# Patient Record
Sex: Male | Born: 1960 | Race: White | Hispanic: No | Marital: Married | Smoking: Former smoker
Health system: Southern US, Community
[De-identification: ages and names within clinical notes are randomized; demographics above are authoritative.]

## PROBLEM LIST (undated history)

## (undated) DIAGNOSIS — S86009A Unspecified injury of unspecified Achilles tendon, initial encounter: Secondary | ICD-10-CM

## (undated) DIAGNOSIS — D751 Secondary polycythemia: Secondary | ICD-10-CM

## (undated) DIAGNOSIS — G5760 Lesion of plantar nerve, unspecified lower limb: Secondary | ICD-10-CM

## (undated) DIAGNOSIS — K219 Gastro-esophageal reflux disease without esophagitis: Secondary | ICD-10-CM

## (undated) DIAGNOSIS — Z720 Tobacco use: Secondary | ICD-10-CM

## (undated) DIAGNOSIS — J302 Other seasonal allergic rhinitis: Secondary | ICD-10-CM

## (undated) DIAGNOSIS — Z9861 Coronary angioplasty status: Secondary | ICD-10-CM

## (undated) DIAGNOSIS — R7309 Other abnormal glucose: Secondary | ICD-10-CM

## (undated) DIAGNOSIS — N529 Male erectile dysfunction, unspecified: Secondary | ICD-10-CM

## (undated) DIAGNOSIS — I2510 Atherosclerotic heart disease of native coronary artery without angina pectoris: Secondary | ICD-10-CM

## (undated) DIAGNOSIS — T7840XA Allergy, unspecified, initial encounter: Secondary | ICD-10-CM

## (undated) DIAGNOSIS — E785 Hyperlipidemia, unspecified: Secondary | ICD-10-CM

## (undated) DIAGNOSIS — G629 Polyneuropathy, unspecified: Secondary | ICD-10-CM

## (undated) DIAGNOSIS — I10 Essential (primary) hypertension: Secondary | ICD-10-CM

## (undated) DIAGNOSIS — M1990 Unspecified osteoarthritis, unspecified site: Secondary | ICD-10-CM

## (undated) DIAGNOSIS — J45909 Unspecified asthma, uncomplicated: Secondary | ICD-10-CM

## (undated) DIAGNOSIS — I2109 ST elevation (STEMI) myocardial infarction involving other coronary artery of anterior wall: Secondary | ICD-10-CM

## (undated) HISTORY — DX: Male erectile dysfunction, unspecified: N52.9

## (undated) HISTORY — DX: Allergy, unspecified, initial encounter: T78.40XA

## (undated) HISTORY — DX: Unspecified injury of unspecified achilles tendon, initial encounter: S86.009A

## (undated) HISTORY — DX: Essential (primary) hypertension: I10

## (undated) HISTORY — PX: OTHER SURGICAL HISTORY: SHX169

## (undated) HISTORY — DX: Gastro-esophageal reflux disease without esophagitis: K21.9

## (undated) HISTORY — DX: Other abnormal glucose: R73.09

## (undated) HISTORY — DX: Hyperlipidemia, unspecified: E78.5

## (undated) HISTORY — DX: Lesion of plantar nerve, unspecified lower limb: G57.60

## (undated) HISTORY — DX: Other seasonal allergic rhinitis: J30.2

---

## 2004-07-29 ENCOUNTER — Ambulatory Visit: Payer: Self-pay | Admitting: Family Medicine

## 2004-12-22 ENCOUNTER — Ambulatory Visit: Payer: Self-pay | Admitting: Family Medicine

## 2005-01-31 ENCOUNTER — Ambulatory Visit: Payer: Self-pay | Admitting: Family Medicine

## 2005-03-10 ENCOUNTER — Ambulatory Visit: Payer: Self-pay | Admitting: Family Medicine

## 2005-03-17 ENCOUNTER — Ambulatory Visit: Payer: Self-pay | Admitting: Family Medicine

## 2005-03-18 ENCOUNTER — Ambulatory Visit: Payer: Self-pay | Admitting: Family Medicine

## 2005-03-24 ENCOUNTER — Ambulatory Visit: Payer: Self-pay | Admitting: Family Medicine

## 2005-06-01 ENCOUNTER — Ambulatory Visit: Payer: Self-pay | Admitting: Family Medicine

## 2005-07-22 ENCOUNTER — Ambulatory Visit: Payer: Self-pay | Admitting: Family Medicine

## 2005-07-26 ENCOUNTER — Ambulatory Visit: Payer: Self-pay | Admitting: Family Medicine

## 2005-08-22 ENCOUNTER — Ambulatory Visit: Payer: Self-pay | Admitting: Family Medicine

## 2006-03-24 ENCOUNTER — Ambulatory Visit: Payer: Self-pay | Admitting: Family Medicine

## 2006-03-24 LAB — CONVERTED CEMR LAB: Blood Glucose, Fasting: 93 mg/dL

## 2006-03-28 ENCOUNTER — Ambulatory Visit: Payer: Self-pay | Admitting: Family Medicine

## 2006-11-09 ENCOUNTER — Ambulatory Visit: Payer: Self-pay | Admitting: Family Medicine

## 2007-03-27 ENCOUNTER — Encounter: Payer: Self-pay | Admitting: Family Medicine

## 2007-03-28 DIAGNOSIS — R739 Hyperglycemia, unspecified: Secondary | ICD-10-CM | POA: Insufficient documentation

## 2007-03-28 DIAGNOSIS — E785 Hyperlipidemia, unspecified: Secondary | ICD-10-CM | POA: Insufficient documentation

## 2007-03-28 DIAGNOSIS — I10 Essential (primary) hypertension: Secondary | ICD-10-CM | POA: Insufficient documentation

## 2007-03-28 DIAGNOSIS — M109 Gout, unspecified: Secondary | ICD-10-CM | POA: Insufficient documentation

## 2007-03-28 DIAGNOSIS — K219 Gastro-esophageal reflux disease without esophagitis: Secondary | ICD-10-CM | POA: Insufficient documentation

## 2007-04-17 ENCOUNTER — Ambulatory Visit: Payer: Self-pay | Admitting: Family Medicine

## 2007-04-23 ENCOUNTER — Ambulatory Visit: Payer: Self-pay | Admitting: Family Medicine

## 2007-06-04 ENCOUNTER — Telehealth (INDEPENDENT_AMBULATORY_CARE_PROVIDER_SITE_OTHER): Payer: Self-pay | Admitting: *Deleted

## 2007-06-25 ENCOUNTER — Ambulatory Visit: Payer: Self-pay | Admitting: Family Medicine

## 2007-11-14 ENCOUNTER — Ambulatory Visit: Payer: Self-pay | Admitting: Family Medicine

## 2007-11-14 DIAGNOSIS — J309 Allergic rhinitis, unspecified: Secondary | ICD-10-CM | POA: Insufficient documentation

## 2007-11-21 ENCOUNTER — Ambulatory Visit: Payer: Self-pay | Admitting: Family Medicine

## 2007-11-28 ENCOUNTER — Ambulatory Visit: Payer: Self-pay | Admitting: Internal Medicine

## 2008-04-28 ENCOUNTER — Ambulatory Visit: Payer: Self-pay | Admitting: Family Medicine

## 2008-04-29 ENCOUNTER — Encounter: Payer: Self-pay | Admitting: Family Medicine

## 2008-05-05 ENCOUNTER — Ambulatory Visit: Payer: Self-pay | Admitting: Family Medicine

## 2008-08-29 ENCOUNTER — Telehealth: Payer: Self-pay | Admitting: Family Medicine

## 2008-10-27 ENCOUNTER — Ambulatory Visit: Payer: Self-pay | Admitting: Family Medicine

## 2008-11-05 ENCOUNTER — Telehealth: Payer: Self-pay | Admitting: Family Medicine

## 2008-12-08 ENCOUNTER — Telehealth: Payer: Self-pay | Admitting: Family Medicine

## 2009-04-20 ENCOUNTER — Telehealth: Payer: Self-pay | Admitting: Family Medicine

## 2009-04-21 ENCOUNTER — Telehealth: Payer: Self-pay | Admitting: Family Medicine

## 2009-04-21 DIAGNOSIS — N529 Male erectile dysfunction, unspecified: Secondary | ICD-10-CM | POA: Insufficient documentation

## 2009-04-23 ENCOUNTER — Telehealth: Payer: Self-pay | Admitting: Family Medicine

## 2009-08-10 ENCOUNTER — Ambulatory Visit: Payer: Self-pay | Admitting: Family Medicine

## 2009-08-13 ENCOUNTER — Ambulatory Visit: Payer: Self-pay | Admitting: Family Medicine

## 2009-08-13 DIAGNOSIS — E875 Hyperkalemia: Secondary | ICD-10-CM | POA: Insufficient documentation

## 2009-08-28 ENCOUNTER — Ambulatory Visit: Payer: Self-pay | Admitting: Family Medicine

## 2009-09-21 ENCOUNTER — Telehealth: Payer: Self-pay | Admitting: Family Medicine

## 2009-09-21 ENCOUNTER — Ambulatory Visit: Payer: Self-pay | Admitting: Family Medicine

## 2010-02-22 ENCOUNTER — Encounter (INDEPENDENT_AMBULATORY_CARE_PROVIDER_SITE_OTHER): Payer: Self-pay | Admitting: *Deleted

## 2010-06-15 ENCOUNTER — Telehealth: Payer: Self-pay | Admitting: Family Medicine

## 2010-08-17 NOTE — Progress Notes (Signed)
Summary: 2nd prior auth form for viagra  Phone Note From Pharmacy   Caller: [facility] Fax) Reason for Call: Patient requests substitution Summary of Call: [facility] has sent a second prior auth form for viagra, form is on your shelf. Initial call taken by: Lowella Petties CMA,  April 23, 2009 3:41 PM  Follow-up for Phone Call        Done. Follow-up by: Shaune Leeks MD,  April 23, 2009 3:55 PM  Additional Follow-up for Phone Call Additional follow up Details #1::        Form faxed Additional Follow-up by: Lowella Petties CMA,  April 23, 2009 4:39 PM

## 2010-08-17 NOTE — Progress Notes (Signed)
Summary: prior auth needed for viagra  Phone Note From Pharmacy   Caller: [facility] Fax) Summary of Call: Prior auth needed for viagra, form is on your shelf. Initial call taken by: Lowella Petties CMA,  April 21, 2009 4:44 PM  Follow-up for Phone Call        Completed. Follow-up by: Shaune Leeks MD,  April 21, 2009 6:50 PM  Additional Follow-up for Phone Call Additional follow up Details #1::        Form faxed. 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

## 2010-08-17 NOTE — Progress Notes (Signed)
Summary: needs written script for atenolol  Phone Note Refill Request Call back at Home Phone [phone] Message from:  Patient  Refills Requested: Medication #1:  ATENOLOL 25 MG  TABS one by mouth bid Pt needs 90 day written script for mail order, please call when ready.  1 month supply sent to [facility] while he waits for mail order.  Initial call taken by: Lowella Petties,  August 29, 2008 3:37 PM  Follow-up for Phone Call        Advised pt script is ready for pick up Follow-up by: Lowella Petties,  September 01, 2008 1:25 PM      Prescriptions: ATENOLOL 25 MG  TABS (ATENOLOL) one by mouth bid  #180 x 4   Entered and Authorized by:   Shaune Leeks MD   Signed by:   Shaune Leeks MD on 09/01/2008   Method used:   Print then Give to Patient   RxID:   [id number] ATENOLOL 25 MG  TABS (ATENOLOL) one by mouth bid  #60 x 0   Entered by:   Lowella Petties   Authorized by:   Shaune Leeks MD   Signed by:   Lowella Petties on 08/29/2008   Method used:   Electronically to        [facility]. [address] [phone]* (retail)       [address] [place], [place]  [id number]       Ph: [phone]       Fax: [phone]   RxID:   [id number]

## 2010-08-17 NOTE — Assessment & Plan Note (Signed)
Summary: 2 MONTH FOLLOW UP BP/RBH   Vital Signs:  Patient Profile:   50 Years Old Male Height:     67 inches Weight:      206 pounds Temp:     98.5 degrees F oral Pulse rate:   56 / minute Pulse rhythm:   regular BP sitting:   124 / 80  (left arm) Cuff size:   regular  Vitals Entered By: Providence Crosby (June 25, 2007 10:17 AM)                 Chief Complaint:  2 MONTH F/U.  History of Present Illness: Doing ok, no complaints...had some hemm issues a few weeks ago that prescription supps resolved. Is here for bp CHECK AND IS DOING FINE, NOS AT HOME ARE GOOD. Shoulder is doing ok, takes Naprosyn for toe pain more than anything else.  Current Allergies (reviewed today): PENICILLIN      Physical Exam  General:     Well-developed,well-nourished,in no acute distress; alert,appropriate and cooperative throughout examination Head:     Normocephalic and atraumatic without obvious abnormalities. No apparent alopecia or balding. Eyes:     Conjunctiva clear bilaterally.  Ears:     External ear exam shows no significant lesions or deformities.  Otoscopic examination reveals clear canals, tympanic membranes are intact bilaterally without bulging, retraction, inflammation or discharge. Hearing is grossly normal bilaterally. Nose:     External nasal examination shows no deformity or inflammation. Nasal mucosa are pink and moist without lesions or exudates. Mouth:     Oral mucosa and oropharynx without lesions or exudates.  Teeth in good repair. Neck:     No deformities, masses, or tenderness noted. Chest Wall:     No deformities, masses, tenderness or gynecomastia noted. Lungs:     Normal respiratory effort, chest expands symmetrically. Lungs are clear to auscultation, no crackles or wheezes. Heart:     Normal rate and regular rhythm. S1 and S2 normal without gallop, murmur, click, rub or other extra sounds.    Impression & Recommendations:  Problem # 1:  HYPERTENSION  (ICD-401.9) Assessment: Improved Good nos today...needs to lose weight . His weight has climbed again. His updated medication list for this problem includes:    Atenolol 25 Mg Tabs (Atenolol) ..... 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  Complete Medication List: 1)  Atenolol 25 Mg Tabs (Atenolol) .... One by mouth bid 2)  Lipitor 80 Mg Tabs (Atorvastatin calcium) .... One by mouth qd 3)  Colchicine 0.6 Mg Tabs (Colchicine) .... One by mouth two times a day prn 4)  Viagra 100 Mg Tabs (Sildenafil citrate) .... Use as needed as directed 5)  Naprosyn 500 Mg Tabs (Naproxen) .... One tab by mouth two times a day as needed.   Patient Instructions: 1)  RTC 10/09 COMP EXAM, labs prior 2)  RTC sooner as needed.    Prescriptions: NAPROSYN 500 MG  TABS (NAPROXEN) one tab by mouth two times a day as needed.  #60 x 12   Entered and Authorized by:   Shaune Leeks MD   Signed by:   Shaune Leeks MD on 06/25/2007   Method used:   Print then Give to Patient   RxID:   [id number]  ]

## 2010-08-17 NOTE — Assessment & Plan Note (Signed)
Summary: ALLERGIES/MK   Vital Signs:  Patient profile:   50 year old male Height:      67 inches Weight:      212 pounds BMI:     33.32 Temp:     97.7 degrees F oral Pulse rate:   60 / minute Pulse rhythm:   regular BP sitting:   130 / 90  (left arm) Cuff size:   large  Vitals Entered By: Providence Crosby (October 27, 2008 12:10 PM) CC: ? allergies   History of Present Illness: Allergy difficulties with eyes, ears and nose from allergies, especially at  night bothering his sleeping. He had some difficulties last year and did reasonably well with OTC antihistamine and nasal inhaler. This year he senses his sxs more intense. 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 Had left knee pain while in the walker. The calf pain started after being out of the walker.  Problems Prior to Update: 1)  Allergic Rhinitis  (ICD-477.9) 2)  Disorder, Rotator Cuff Nec  (ICD-726.19) 3)  Health Maintenance Exam  (ICD-V70.0) 4)  Hyperglycemia  (ICD-790.29) 5)  Hx of Hypercholesterolemia  (ICD-272.0) 6)  Hypertension  (ICD-401.9) 7)  Gout  (ICD-274.9) 8)  Gerd  (ICD-530.81)  Allergies: 1)  ! Coricidin D (Chlorphen-Pseudoephed-Apap) 2)  Penicillin  Past History:  Past Medical History:    GERD    Gout    Hypertension    Elevated glucose     (03/27/2007)  Risk Factors:    Alcohol Use: 2 (05/05/2008)    >5 drinks/d w/in last 3 months: N/A    Caffeine Use: 1 (04/23/2007)    Diet: N/A    Exercise: yes (04/23/2007)  Risk Factors:    Smoking Status: quit (04/23/2007)    Packs/Day: N/A    Cigars/wk: N/A    Pipe Use/wk: N/A    Cans of tobacco/wk: N/A    Passive Smoke Exposure: no (04/23/2007)  Physical Exam  General:  Well-developed,well-nourished,in no acute distress; alert,appropriate and cooperative throughout examination Head:  Normocephalic and atraumatic without  obvious abnormalities. No apparent alopecia or balding. Eyes:  Mild injection of the palpebral conj dependently bilat. Ears:  External ear exam shows no significant lesions or deformities.  Otoscopic examination reveals clear canals, tympanic membranes are intact bilaterally without bulging, retraction, inflammation or discharge. Hearing is grossly normal bilaterally. Specifically, the right ear looks nml. Nose:  Mild inflammation. Mouth:  Oral mucosa and oropharynx without lesions or exudates.  Teeth in good repair. Mild clear PND. Neck:  No deformities, masses, or tenderness noted. Chest Wall:  No deformities, masses, tenderness or gynecomastia noted. Lungs:  Normal respiratory effort, chest expands symmetrically. Lungs are clear to auscultation, no crackles or wheezes. Heart:  Normal rate and regular rhythm. S1 and S2 normal without gallop, murmur, click, rub or other extra sounds.   Impression & Recommendations:  Problem # 1:  ALLERGIC RHINITIS (ICD-477.9) Assessment Deteriorated  Cont Claritin. Add Astepro two times a day, given sample. May add Nasonex as needed. Given written script for Patanol to be tried as needed. His updated medication list for this problem includes:    Claritin 10 Mg Tabs (Loratadine) .Marland Kitchen... 1 daily by mouth  Discussed use of allergy medications and environmental measures.   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 Time and continued NSAID after eating. Heat and ice. Will refer to Ortho if continues.  Complete Medication List: 1)  Atenolol 25 Mg Tabs (Atenolol) .... One by mouth two times a day 2)  Colchicine 0.6 Mg Tabs (Colchicine) .... One by mouth two times a day prn 3)  Viagra 100 Mg Tabs (Sildenafil citrate) .... Use as needed as directed 4)  Lipitor 40 Mg Tabs (Atorvastatin calcium) .Marland Kitchen.. 1 daily by mouth 5)  Diclofenac Sodium 75 Mg Tbec (Diclofenac  sodium) .Marland Kitchen.. 1 daily by mouth 6)  Claritin 10 Mg Tabs (Loratadine) .Marland Kitchen.. 1 daily by mouth

## 2010-08-17 NOTE — Progress Notes (Signed)
Summary: needs written scripts for all his meds  Phone Note Call from Patient Call back at Home Phone [phone]   Caller: Patient Call For: Stephen Leeks MD Summary of Call: Pt requests [facility] scripts for all his meds to send to mail order.  Please call pt when ready. Initial call taken by: Lowella Petties,  Dec 08, 2008 1:40 PM  Follow-up for Phone Call        Patient notified to pick up Follow-up by: Providence Crosby,  Dec 08, 2008 4:46 PM    New/Updated Medications: DICLOFENAC SODIUM 75 MG TBEC (DICLOFENAC SODIUM) 1 daily by mouth as needed   Prescriptions: DICLOFENAC SODIUM 75 MG TBEC (DICLOFENAC SODIUM) 1 daily by mouth as needed  #90 x 4   Entered and Authorized by:   Stephen Leeks MD   Signed by:   Stephen Leeks MD on 12/08/2008   Method used:   Print then Give to Patient   RxID:   [id number] ADVAIR DISKUS 100-50 MCG/DOSE MISC (FLUTICASONE-SALMETEROL) one inhalation two times a day  #3 discus x 4   Entered and Authorized by:   Stephen Leeks MD   Signed by:   Stephen Leeks MD on 12/08/2008   Method used:   Print then Give to Patient   RxID:   [id number] CLARITIN 10 MG TABS (LORATADINE) 1 daily by mouth  #90 x 4   Entered and Authorized by:   Stephen Leeks MD   Signed by:   Stephen Leeks MD on 12/08/2008   Method used:   Print then Give to Patient   RxID:   [id number] LIPITOR 40 MG  TABS (ATORVASTATIN CALCIUM) 1 daily by mouth  #90 x 4   Entered and Authorized by:   Stephen Leeks MD   Signed by:   Stephen Leeks MD on 12/08/2008   Method used:   Print then Give to Patient   RxID:   [id number] COLCHICINE 0.6 MG  TABS (COLCHICINE) one by mouth two times a day prn  #60 x 3   Entered and Authorized by:   Stephen Leeks MD   Signed by:   Stephen Leeks MD on 12/08/2008   Method used:   Print then Give to Patient   RxID:   [id number] ATENOLOL 25 MG  TABS (ATENOLOL) one  by mouth two times a day  #180 x 4   Entered and Authorized by:   Stephen Leeks MD   Signed by:   Stephen Leeks MD on 12/08/2008   Method used:   Print then Give to Patient   RxID:   [id number]

## 2010-08-17 NOTE — Assessment & Plan Note (Signed)
Summary: ?EYE INFECTION/CLE   Vital Signs:  Patient Profile:   50 Years Old Male Height:     67 inches Weight:      209.13 pounds Temp:     98 degrees F oral Pulse rate:   76 / minute Pulse rhythm:   regular Resp:     18 per minute BP sitting:   136 / 78  (left arm) Cuff size:   large  Vitals Entered By: Wandra Mannan (Nov 28, 2007 2:24 PM)                 Visit Type:  Acute PCP:  Hetty Ely  Chief Complaint:  ? eye infection.  History of Present Illness: Patient is here for follow-up for his eyes because 2 weeks ago he had alot of allergy sx's and he was given some eye drops and things got a little better. Came back last week because he had sx's of chest congestion and cough. Cough was non-productive. The only reason he is back is because his eyes especially the OS, is still hurting and he has been using the sulfacetimide for almost two weeks. It sticks together sometimes with a little drainage.  Had a reaction to one of the OTC cough preparations, Coriciden, and felt horrible, almost as if he were going to have a seizure. Advised by Dr. Hetty Ely not to take OTC cough preparations.    Updated Prior Medication List: ATENOLOL 25 MG  TABS (ATENOLOL) one by mouth bid LIPITOR 80 MG  TABS (ATORVASTATIN CALCIUM) one by mouth qd COLCHICINE 0.6 MG  TABS (COLCHICINE) one by mouth two times a day prn VIAGRA 100 MG  TABS (SILDENAFIL CITRATE) use as needed as directed NAPROSYN 500 MG  TABS (NAPROXEN) one tab by mouth two times a day as needed. NASONEX 50 MCG/ACT  SUSP (MOMETASONE FUROATE) Two sprays in each nostril twice a day.  Current Allergies (reviewed today): ! CORICIDIN D (CHLORPHEN-PSEUDOEPHED-APAP) PENICILLIN  Past Medical History:    Reviewed history from 03/27/2007 and no changes required:       GERD       Gout       Hypertension       Elevated glucose  Past Surgical History:    Reviewed history from 03/27/2007 and no changes required:       HOSP Asthma multiple  times as child       Fx L AForearm closed fx  as child   Family History:    Reviewed history from 04/23/2007 and no changes required:       Father: A 79 HTN, stroke       Mother: dec 77 Stroke       Sister A Htn Hyperchol       Brother died from alcoholism and organ failure  Social History:    Reviewed history from 03/27/2007 and no changes required:       Marital Status: Married       Children: 1 son, 1 stepson       Occupation: Merchandiser, retail of special chemistries at [employer]    Review of Systems       The patient complains of prolonged cough.  The patient denies fever, chest pain, syncope, dyspnea on exhertion, peripheral edema, hemoptysis, abdominal pain, and severe indigestion/heartburn.         The cough is malingering but is non-productive.   Physical Exam  General:     alert, well-developed, well-nourished, well-hydrated, appropriate dress, normal appearance, healthy-appearing, cooperative to examination, and  good hygiene.   Eyes:     OS is errythematous in the conjunctiva and lower portion of the sclera is mildly red. There is yellow drainage dried in the lashes. Periorbital swelling and puffiness. + some tenderness. OD- there is errythema noted conjunctiva and the entire lower half of the sclera. No drainage.  PERRLA. Mouth:     pharynx pink and moist.   Neck:     supple and full ROM.   Lungs:     normal respiratory effort, no accessory muscle use, and normal breath sounds.   Heart:     normal rate and regular rhythm.   Neurologic:     alert & oriented X3.   Skin:     turgor normal, color normal, and no rashes.   Psych:     memory intact for recent and remote, normally interactive, good eye contact, not anxious appearing, and not depressed appearing.      Impression & Recommendations:  Problem # 1:  CONJUNCTIVITIS (ICD-372.30) Has used Sulfacetamide x 2 weeks with the problem not resolved and there is still drainage from OS. Stop the  sulfacetamide. Tobradex- 1-2 drops in both eyes every 4 hours while awake x 7 days. Wash eyes gently with cotton balls moisten with water and a little Johnson's baby shampoo. Do this at least 3 x a day. Return if symptoms worsen or if not improved after the eye drops are completed.  Problem # 2:  BRONCHITIS, ALLERGIC (ICD-493.90) Patient has stopped the Advair. He is asymptomatic except for a little non-productive cough that is nagging. Drink increased fluids and keep hard candy in pocket for when cough is worst.  The following medications were removed from the medication list:    Advair Diskus 100-50 Mcg/dose Misc (Fluticasone-salmeterol) ..... One inh two times a day   Complete Medication List: 1)  Atenolol 25 Mg Tabs (Atenolol) .... One by mouth bid 2)  Lipitor 80 Mg Tabs (Atorvastatin calcium) .... One by mouth qd 3)  Colchicine 0.6 Mg Tabs (Colchicine) .... One by mouth two times a day prn 4)  Viagra 100 Mg Tabs (Sildenafil citrate) .... Use as needed as directed 5)  Naprosyn 500 Mg Tabs (Naproxen) .... One tab by mouth two times a day as needed. 6)  Nasonex 50 Mcg/act Susp (Mometasone furoate) .... Two sprays in each nostril twice a day. 7)  Tobradex 0.3-0.1 % Susp (Tobramycin-dexamethasone) .... Instill 1-2 drops in each eye every 4 hours while awake x 7 days.   Patient Instructions: 1)  Stop the sulfacetamide. 2)  Tobradex- 1-2 drops in both eyes every 4 hours while awake x 7 days. 3)  Wash eyes gently with cotton balls moisten with water and a little Johnson's baby shampoo. Do this at least 3 x a day. 4)  Return if symptoms worsen or if not improved after the eye drops are completed. 5)   Hard candy handy when coughing.   Prescriptions: TOBRADEX 0.3-0.1 %  SUSP (TOBRAMYCIN-DEXAMETHASONE) Instill 1-2 drops in each eye every 4 hours while awake x 7 days.  #5 ml x 0   Entered and Authorized by:   Kathie Rhodes NP   Signed by:   Kathie Rhodes NP on 11/28/2007    Method used:   Print then Give to Patient   RxID:   [id number]  ] Current Allergies (reviewed today): ! CORICIDIN D (CHLORPHEN-PSEUDOEPHED-APAP) PENICILLIN Current Medications (including changes made in today's visit):  ATENOLOL 25 MG  TABS (ATENOLOL) one by mouth  bid LIPITOR 80 MG  TABS (ATORVASTATIN CALCIUM) one by mouth qd COLCHICINE 0.6 MG  TABS (COLCHICINE) one by mouth two times a day prn VIAGRA 100 MG  TABS (SILDENAFIL CITRATE) use as needed as directed NAPROSYN 500 MG  TABS (NAPROXEN) one tab by mouth two times a day as needed. NASONEX 50 MCG/ACT  SUSP (MOMETASONE FUROATE) Two sprays in each nostril twice a day. TOBRADEX 0.3-0.1 %  SUSP (TOBRAMYCIN-DEXAMETHASONE) Instill 1-2 drops in each eye every 4 hours while awake x 7 days.

## 2010-08-17 NOTE — Assessment & Plan Note (Signed)
Summary: CONJUNCTIVITIS/HEA   Vital Signs:  Patient Profile:   50 Years Old Male Height:     67 inches Weight:      208 pounds Temp:     99.4 degrees F oral Pulse rate:   52 / minute BP sitting:   168 / 93  (left arm) Cuff size:   large  Vitals Entered By: Cooper Render (November 14, 2007 8:53 AM)                 Chief Complaint:  ?conjunctivitis, eyes matted yest am, ? allergies/URI sx, took coricidin HBP, and caused shakiness.  History of Present Illness: Here for URI sx and conjunctivitis --matted eyelashes, x 1wk0--wowrse last 2d, thick yellow and itch --nasal congestion, cough has resolved--took coriciden and had reaction--sweatind,shaking.  no chills/fever.  not able to sleep lying down due to post nasal drip--up since 3am today.    Current Allergies (reviewed today): PENICILLIN     Review of Systems      See HPI   Physical Exam  General:     alert, well-developed, well-nourished, and well-hydrated.   Eyes:     pupils equal and pupils round.  minimal injection, lashes clean--wearing glasses Ears:     TMs retracted with some fluid Nose:     boggy and no airflow obstruction and mucosal edema.   Mouth:     no exudates and pharyngeal erythema.   Lungs:     normal respiratory effort, no intercostal retractions, no accessory muscle use, and normal breath sounds.   Neurologic:     alert & oriented X3 and gait normal.   Cervical Nodes:     no anterior cervical adenopathy and no posterior cervical adenopathy.   Psych:     normally interactive and good eye contact.      Impression & Recommendations:  Problem # 1:  ALLERGIC RHINITIS (ICD-477.9) Assessment: New will use clarinex once daily, can then move to claritin 1 two times a day as needed His updated medication list for this problem includes:    Clarinex 5 Mg Tabs (Desloratadine) .Marland Kitchen... 1 once daily for congestion   Problem # 2:  CONJUNCTIVITIS (ICD-372.30) Assessment: New will use sodium sulamyd  10% gtts 2 qid x1wk throw old contact lenses away--wear glasses of the full wk of med disscussed hygeine  Complete Medication List: 1)  Atenolol 25 Mg Tabs (Atenolol) .... One by mouth bid 2)  Lipitor 80 Mg Tabs (Atorvastatin calcium) .... One by mouth qd 3)  Colchicine 0.6 Mg Tabs (Colchicine) .... One by mouth two times a day prn 4)  Viagra 100 Mg Tabs (Sildenafil citrate) .... Use as needed as directed 5)  Naprosyn 500 Mg Tabs (Naproxen) .... One tab by mouth two times a day as needed. 6)  Clarinex 5 Mg Tabs (Desloratadine) .Marland Kitchen.. 1 once daily for congestion 7)  Sodium Sulamyd 10%  .... Gtts 2 qid both eyes for 1 wk     Prescriptions: SODIUM SULAMYD 10% gtts 2 qid both eyes for 1 wk  #1 x 0   Entered and Authorized by:   Gildardo Griffes FNP   Signed by:   Gildardo Griffes FNP on 11/14/2007   Method used:   Print then Give to Patient   RxID:   [id number] CLARINEX 5 MG  TABS (DESLORATADINE) 1 once daily for congestion Brand medically necessary #5 x 0   Entered and Authorized by:   Gildardo Griffes FNP   Signed by:  Billie-Lynn Tyler Deis FNP on 11/14/2007   Method used:   Print then Give to Patient   RxID:   [id number]  ] Prior Medications (reviewed today): ATENOLOL 25 MG  TABS (ATENOLOL) one by mouth bid LIPITOR 80 MG  TABS (ATORVASTATIN CALCIUM) one by mouth qd COLCHICINE 0.6 MG  TABS (COLCHICINE) one by mouth two times a day prn VIAGRA 100 MG  TABS (SILDENAFIL CITRATE) use as needed as directed NAPROSYN 500 MG  TABS (NAPROXEN) one tab by mouth two times a day as needed. Current Allergies (reviewed today): PENICILLIN

## 2010-08-17 NOTE — Assessment & Plan Note (Signed)
Summary: cpx/rbh   Vital Signs:  Patient Profile:   49 Years Old Male Height:     67 inches Weight:      203 pounds Temp:     99.6 degrees F oral Pulse rate:   64 / minute Pulse rhythm:   regular BP sitting:   140 / 80  (left arm) Cuff size:   large  Vitals Entered By: Providence Crosby (May 05, 2008 8:23 AM)                 PCP:  Hetty Ely  Chief Complaint:  checkup.  History of Present Illness: Pt here for CompExam, doing well...finally got over his conjunctivitis. Has rushed to get here...BP a thome, typically 120s/70s daily at home on his machine, no headache and no chest pain. He has no complaint today. Did have right earache which has resolved.    Prior Medications Reviewed Using: Patient Recall  Current Allergies (reviewed today): ! CORICIDIN D (CHLORPHEN-PSEUDOEPHED-APAP) PENICILLIN   Family History:    Father: A 80  HTN, stroke dementia visual acuity problems    Mother: dec 77 Stroke    Sister A 17 Htn Hyperchol    Brother died from alcoholism and organ failure   Risk Factors:     Drinks per day:  2    Counseled to quit/cut down alcohol use:  no   Review of Systems  General      Complains of sweats.      chronically with exercise and stress  Eyes      Denies blurring, discharge, double vision, eye irritation, eye pain, halos, itching, light sensitivity, red eye, vision loss-1 eye, and vision loss-both eyes.  ENT      Denies decreased hearing, difficulty swallowing, ear discharge, earache, hoarseness, nasal congestion, nosebleeds, postnasal drainage, ringing in ears, sinus pressure, and sore throat.  CV      Denies bluish discoloration of lips or nails, chest pain or discomfort, difficulty breathing at night, difficulty breathing while lying down, fainting, fatigue, leg cramps with exertion, lightheadness, near fainting, palpitations, shortness of breath with exertion, swelling of feet, swelling of hands, and weight gain.  Resp      Complains of  wheezing.      Denies chest discomfort, chest pain with inspiration, cough, coughing up blood, excessive snoring, hypersomnolence, morning headaches, pleuritic, shortness of breath, and sputum productive.      mild with exercise.  GI      Denies abdominal pain, bloody stools, change in bowel habits, constipation, dark tarry stools, diarrhea, excessive appetite, gas, hemorrhoids, indigestion, loss of appetite, nausea, vomiting, vomiting blood, and yellowish skin color.  GU      Denies decreased libido, discharge, dysuria, erectile dysfunction, genital sores, hematuria, incontinence, nocturia, urinary frequency, and urinary hesitancy.  MS      Complains of joint pain.      mild  Derm      Denies changes in color of skin, changes in nail beds, dryness, excessive perspiration, flushing, hair loss, insect bite(s), itching, lesion(s), poor wound healing, and rash.  Neuro      Denies brief paralysis, difficulty with concentration, disturbances in coordination, falling down, headaches, inability to speak, memory loss, numbness, poor balance, seizures, sensation of room spinning, tingling, tremors, visual disturbances, and weakness.   Physical Exam  General:     Well-developed,well-nourished,in no acute distress; alert,appropriate and cooperative throughout examination Head:     Normocephalic and atraumatic without obvious abnormalities. No apparent alopecia or balding. Eyes:  Conjunctiva clear bilaterally.  Ears:     External ear exam shows no significant lesions or deformities.  Otoscopic examination reveals clear canals, tympanic membranes are intact bilaterally without bulging, retraction, inflammation or discharge. Hearing is grossly normal bilaterally. Nose:     External nasal examination shows no deformity or inflammation. Nasal mucosa are pink and moist without lesions or exudates. Mouth:     Oral mucosa and oropharynx without lesions or exudates.  Teeth in good repair. Neck:      No deformities, masses, or tenderness noted. Chest Wall:     No deformities, masses, tenderness or gynecomastia noted. Breasts:     No masses or gynecomastia noted Lungs:     Normal respiratory effort, chest expands symmetrically. Lungs are clear to auscultation, no crackles or wheezes. Heart:     Normal rate and regular rhythm. S1 and S2 normal without gallop, murmur, click, rub or other extra sounds. Abdomen:     Bowel sounds positive,abdomen soft and non-tender without masses, organomegaly or hernias noted. Rectal:     No external abnormalities noted. Normal sphincter tone. No rectal masses or tenderness. G neg. Genitalia:     Testes bilaterally descended without nodularity, tenderness or masses. No scrotal masses or lesions. No penis lesions or urethral discharge. Prostate:     Prostate gland firm and smooth, no enlargement, nodularity, tenderness, mass, asymmetry or induration. 30gms. Msk:     No deformity or scoliosis noted of thoracic or lumbar spine.   Pulses:     R and L carotid,radial,femoral,dorsalis pedis and posterior tibial pulses are full and equal bilaterally Extremities:     No clubbing, cyanosis, edema, or deformity noted with normal full range of motion of all joints.   Neurologic:     No cranial nerve deficits noted. Station and gait are normal. Plantar reflexes are down-going bilaterally. DTRs are symmetrical throughout. Sensory, motor and coordinative functions appear intact. Skin:     Intact without suspicious lesions or rashes Cervical Nodes:     No lymphadenopathy noted Inguinal Nodes:     No significant adenopathy Psych:     Cognition and judgment appear intact. Alert and cooperative with normal attention span and concentration. No apparent delusions, illusions, hallucinations    Impression & Recommendations:  Problem # 1:  HEALTH MAINTENANCE EXAM (ICD-V70.0) Assessment: Comment Only  Problem # 2:  ALLERGIC RHINITIS (ICD-477.9) Assessment:  Unchanged Stable. The following medications were removed from the medication list:    Nasonex 50 Mcg/act Susp (Mometasone furoate) .Marland Kitchen..Marland Kitchen Two sprays in each nostril twice a day. Discussed use of allergy medications and environmental measures.   Problem # 3:  HYPERGLYCEMIA (ICD-790.29) Assessment: Unchanged Watch sweets and carbs as able. Sugar this year nml with nml Microalb.  Problem # 4:  Hx of HYPERCHOLESTEROLEMIA (ICD-272.0) Assessment: Unchanged Stable and acceptable. LDL this year below 100. His updated medication list for this problem includes:    Lipitor 40 Mg Tabs (Atorvastatin calcium) .Marland Kitchen... 1 daily by mouth   Problem # 5:  HYPERTENSION (ICD-401.9) Assessment: Unchanged Will follow. Ambulatory nos. ok. His updated medication list for this problem includes:    Atenolol 25 Mg Tabs (Atenolol) ..... One by mouth bid  BP today: 140/80 Prior BP: 136/78 (11/28/2007)   Problem # 6:  GOUT (ICD-274.9) Assessment: Unchanged Stable, has infrequent sxs...cont as needed meds. His updated medication list for this problem includes:    Colchicine 0.6 Mg Tabs (Colchicine) ..... One by mouth two times a day prn    Naprosyn  500 Mg Tabs (Naproxen) ..... One tab by mouth two times a day as needed.   Problem # 7:  GERD (ICD-530.81) Assessment: Unchanged Stable on as needed meds.  Complete Medication List: 1)  Atenolol 25 Mg Tabs (Atenolol) .... One by mouth bid 2)  Colchicine 0.6 Mg Tabs (Colchicine) .... One by mouth two times a day prn 3)  Viagra 100 Mg Tabs (Sildenafil citrate) .... Use as needed as directed 4)  Naprosyn 500 Mg Tabs (Naproxen) .... One tab by mouth two times a day as needed. 5)  Lipitor 40 Mg Tabs (Atorvastatin calcium) .Marland Kitchen.. 1 daily by mouth   Patient Instructions: 1)  RTC one or as needed.   ]

## 2010-08-17 NOTE — Progress Notes (Signed)
Summary: requests advair  Phone Note Refill Request Call back at Home Phone [phone] Message from:  Patient  Refills Requested: Medication #1:  Advair This is no longer on pt's  med list but he says he took it last year,  is having problems with allergies- wheezing.  Uses [facility].  Initial call taken by: Lowella Petties,  November 05, 2008 9:05 AM    New/Updated Medications: ADVAIR DISKUS 100-50 MCG/DOSE MISC (FLUTICASONE-SALMETEROL) one inhalation two times a day   Prescriptions: ADVAIR DISKUS 100-50 MCG/DOSE MISC (FLUTICASONE-SALMETEROL) one inhalation two times a day  #1 discus x 1   Entered and Authorized by:   Shaune Leeks MD   Signed by:   Shaune Leeks MD on 11/05/2008   Method used:   Electronically to        [facility]. [address] [phone]* (retail)       [address] [place], [place]  [address]       Ph: [phone]       Fax: [phone]   RxID:   [id number]

## 2010-08-17 NOTE — Assessment & Plan Note (Signed)
Summary: CPX... CYD  R/S FROM 06/23/09   Vital Signs:  Patient profile:   50 year old male Height:      67 inches Weight:      211.75 pounds BMI:     33.28 Temp:     98.8 degrees F oral Pulse rate:   60 / minute Pulse rhythm:   regular BP sitting:   122 / 78  (left arm) Cuff size:   large  Vitals Entered By: Sydell Axon LPN (August 13, 2009 8:19 AM) CC: 30 Minute checkup, tetanus updated today   History of Present Illness: Pt here for Comp Exam. His allergies are well controlled. Claritin D did real well for him last year and he would like to try it again. Gout has been a problem....had knee probs and was found to have Gout last summer. Recently he had foot inflammation. He has been using Colchicine for flares and takes cherry juice also. He also has right shoulder pain with increased exercise.  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 One By Mouth Two Times A Day 2)  Colchicine 0.6 Mg  Tabs (Colchicine) .... One By Mouth Two Times A Day Prn 3)  Viagra 100 Mg  Tabs (Sildenafil Citrate) .... Use As Needed As  Directed 4)  Lipitor 40 Mg  Tabs (Atorvastatin Calcium) .Marland Kitchen.. 1 Daily By Mouth 5)  Diclofenac Sodium 75 Mg Tbec (Diclofenac Sodium) .Marland Kitchen.. 1 Daily By Mouth As Needed 6)  Claritin 10 Mg Tabs (Loratadine) .Marland Kitchen.. 1 Daily By Mouth 7)  Advair Diskus 100-50 Mcg/dose Misc (Fluticasone-Salmeterol) .... One Inhalation Two Times A Day  Allergies: 1)  ! Coricidin D (Chlorphen-Pseudoephed-Apap) 2)  Penicillin  Past History:  Past Medical History: Last updated: 03/27/2007 GERD Gout Hypertension Elevated glucose  Family History: Last updated: 08/13/2009 Father: A 81  HTN, stroke dementia visual acuity problems Mother: dec 77 Stroke Sister A 50 Htn Hyperchol Brother died from alcoholism and organ failure  Social History: Last updated: 03/27/2007 Marital Status: Married Children: 1 son, 1 stepson Occupation: Merchandiser, retail of special chemistries at [employer]  Risk Factors: Alcohol Use: 2 (08/13/2009) Caffeine Use: 1 (08/13/2009) Exercise: yes (08/13/2009)  Risk Factors: Smoking Status: quit (08/13/2009) Passive Smoke Exposure: no (08/13/2009)  Past Surgical History: HOSP Asthma multiple times as child Fx L Forearm closed fx  as child  Family History: Father: A 81  HTN, stroke dementia visual acuity problems Mother: dec 77 Stroke Sister  A 50 Htn Hyperchol Brother died from alcoholism and organ failure  Review of Systems General:  Complains of sweats; denies chills, fatigue, fever, weakness, and weight loss; see HPI. Eyes:  Denies blurring, discharge, and eye pain. ENT:  Denies decreased hearing, ear discharge, and earache. CV:  Denies chest pain or discomfort, fainting, fatigue, palpitations, and shortness of breath with exertion. Resp:  Complains of wheezing; denies chest pain with inspiration, cough, and shortness of breath; occas with allergies. GI:  Denies abdominal pain, bloody stools, change in bowel habits, constipation, dark tarry stools, diarrhea, indigestion, loss of appetite,  nausea, vomiting, vomiting blood, and yellowish skin color. GU:  Complains of nocturia; denies discharge and dysuria; occas.. MS:  Complains of joint pain; denies muscle aches, muscle weakness, and stiffness; mild, per HPI.. Derm:  Denies dryness, itching, and rash. Neuro:  Denies numbness, poor balance, tingling, and tremors.  Physical Exam  General:  Well-developed,well-nourished,in no acute distress; alert,appropriate and cooperative throughout examination Head:  Normocephalic and atraumatic without obvious abnormalities. No apparent alopecia or balding. Sinuses NT. Eyes:  Conjunctiva clear bilaterally.  Ears:  External ear exam shows no significant lesions or deformities.  Otoscopic examination reveals clear canals, tympanic membranes are intact bilaterally without bulging, retraction, inflammation or discharge. Hearing is grossly normal bilaterally. Specifically, the right ear looks nml. Nose:  External nasal examination shows no deformity or inflammation. Nasal mucosa are pink and moist without lesions or exudates. Mouth:  Oral mucosa and oropharynx without lesions or exudates.  Teeth in good repair. Mild clear PND. Neck:  No deformities, masses, or tenderness noted. Chest Wall:  No deformities, masses, tenderness or gynecomastia noted. Breasts:  No masses or gynecomastia noted Lungs:  Normal respiratory effort, chest expands symmetrically. Lungs are clear to auscultation, no crackles or wheezes. Heart:  Normal rate and regular rhythm. S1 and S2 normal without gallop, murmur, click, rub or other extra sounds. Abdomen:  Bowel sounds positive,abdomen soft and non-tender without masses, organomegaly or hernias noted. Rectal:  No external abnormalities noted. Normal sphincter tone. No rectal masses or tenderness. G neg. Genitalia:  Testes bilaterally descended without nodularity, tenderness or masses. No scrotal masses or lesions. No penis lesions or urethral discharge. Prostate:  Prostate  gland firm and smooth, no enlargement, nodularity, tenderness, mass, asymmetry or induration. 20-30gms. Msk:  No deformity or scoliosis noted of thoracic or lumbar spine.  Mild discomfort wit right shoulder ROM at the A/C joint. Pulses:  R and L carotid,radial,femoral,dorsalis pedis and posterior tibial pulses are full and equal bilaterally Extremities:  No clubbing, cyanosis, edema, or deformity noted with normal full range of motion of all joints.   Neurologic:  No cranial nerve deficits noted. Station and gait are normal. Plantar reflexes are down-going bilaterally. DTRs are symmetrical throughout. Sensory, motor and coordinative functions appear intact. Skin:  Intact without suspicious lesions or rashes Cervical Nodes:  No lymphadenopathy noted Inguinal Nodes:  No significant adenopathy Psych:  Cognition and judgment appear intact. Alert and cooperative with normal attention span and concentration. No apparent delusions, illusions, hallucinations   Impression & Recommendations:  Problem # 1:  HEALTH MAINTENANCE EXAM (ICD-V70.0) Assessment Comment Only Tdap today.  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 Discussed.  Problem # 3:  ORGANIC IMPOTENCE (ICD-607.84) Assessment: Unchanged OK with meds. His updated medication list for this problem includes:    Viagra 100 Mg Tabs (Sildenafil citrate) ..... Use as needed as directed  Problem #  4:  HYPERGLYCEMIA (ICD-790.29) Assessment: Improved Minimally elevated at 101....discussed avoiding sweets and carbs to avoid acceleration to DM.  Problem # 5:  Hx of HYPERCHOLESTEROLEMIA (ICD-272.0) Trig high at 160, HDL44, LDL 67...overall acceptable. Watch sweets and carbs for both Trigs and Glucose. His updated medication list for this problem includes:    Lipitor 40 Mg Tabs (Atorvastatin calcium) .Marland Kitchen... 1 daily by mouth  Problem # 6:  HYPERTENSION (ICD-401.9) Assessment:  Unchanged Stable. Keep an eye on BP due to using "D" in antihistamine. His updated medication list for this problem includes:    Atenolol 25 Mg Tabs (Atenolol) ..... One by mouth two times a day  BP today: 122/78 Prior BP: 130/90 (10/27/2008)  Problem # 7:  GOUT (ICD-274.9) Assessment: Unchanged U.A 8.4  Discussed appproach and wants to cont as is. F/U as needed  The following medications were removed from the medication list:    Diclofenac Sodium 75 Mg Tbec (Diclofenac sodium) .Marland Kitchen... 1 daily by mouth as needed His updated medication list for this problem includes:    Colchicine 0.6 Mg Tabs (Colchicine) ..... One by mouth two times a day as needed  Elevate extremity; warm compresses, symptomatic relief and medication as directed.   Problem # 8:  GERD (ICD-530.81) Assessment: Unchanged Stable. Diagnostics Reviewed:  Discussed lifestyle modifications, diet, antacids/medications, and preventive measures. Handout provided.   Problem # 9:  HYPERKALEMIA (ICD-276.7) Assessment: New Would repaet in a few weeks to insure was clotting problem.  Complete Medication List: 1)  Atenolol 25 Mg Tabs (Atenolol) .... One by mouth two times a day 2)  Colchicine 0.6 Mg Tabs (Colchicine) .... One by mouth two times a day as needed 3)  Viagra 100 Mg Tabs (Sildenafil citrate) .... Use as needed as directed 4)  Lipitor 40 Mg Tabs (Atorvastatin calcium) .Marland Kitchen.. 1 daily by mouth 5)  Claritin 10 Mg Tabs (Loratadine) .Marland Kitchen.. 1 daily by mouth as needed 6)  Advair Diskus 100-50 Mcg/dose Misc (Fluticasone-salmeterol) .... One inhalation two times a day as needed 7)  Claritin-d 24 Hour 10-240 Mg Xr24h-tab (Loratadine-pseudoephedrine) .... One tab by mouth once daily  Other Orders: Tdap => 41yrs IM ([address]109) Admin 1st Vaccine (60454) Admin 1st Vaccine [facility]) [phone])  Patient Instructions: 1)  Repet labs in a week or two to insure K of 5.4 from hemylization. Prescriptions: CLARITIN-D 24 HOUR 10-240 MG XR24H-TAB  (LORATADINE-PSEUDOEPHEDRINE) one tab by mouth once daily  #30 x 12   Entered and Authorized by:   Shaune Leeks MD   Signed by:   Shaune Leeks MD on 08/14/2009   Method used:   Electronically to        [facility]. [address] [phone]* (retail)       [address] [place], [place]  [address]       Ph: [phone]       Fax: [phone]   RxID:   [id number]   Current Allergies (reviewed today): ! CORICIDIN D (CHLORPHEN-PSEUDOEPHED-APAP) PENICILLIN   Tetanus/Td Vaccine    Vaccine Type: Tdap    Site: left deltoid    Mfr: GlaxoSmithKline    Dose: 0.5 ml    Route: IM    Given by: Sydell Axon LPN    Exp. Date: 09/12/2011    Lot #: [id number]    VIS given: 06/05/07 version given August 13, 2009.   Appended Document: CPX... CYD  R/S FROM 06/23/09 Pls have pt get K in a week or two  to recheck elevation on labs.  Appended Document: CPX... CYD  R/S FROM 06/23/09 Patient notified as instructed by telephone. Lab appt scheduled for 08/28/09.

## 2010-08-17 NOTE — Progress Notes (Signed)
Summary: refill request for viagra  Phone Note Refill Request Message from:  Fax from Pharmacy  Refills Requested: Medication #1:  VIAGRA 100 MG  TABS use as needed as directed [facility].  Initial call taken by: Lowella Petties CMA,  April 20, 2009 9:34 AM  Follow-up for Phone Call        Faxed forms Follow-up by: Lowella Petties CMA,  April 20, 2009 3:05 PM    Prescriptions: VIAGRA 100 MG  TABS (SILDENAFIL CITRATE) use as needed as directed  #30 x 3   Entered and Authorized by:   Shaune Leeks MD   Signed by:   Shaune Leeks MD on 04/20/2009   Method used:   Printed then faxed to ...       [facility] Fax) (mail-order)             ,          Ph: [phone]       Fax: [phone]   RxID:   [id number]

## 2010-08-17 NOTE — Progress Notes (Signed)
Summary: viagra   Phone Note Refill Request Message from:  Fax from Pharmacy on June 15, 2010 8:57 AM  Refills Requested: Medication #1:  VIAGRA 100 MG  TABS use as needed as directed [facility] [phone].   Initial call taken by: Melody Comas,  June 15, 2010 9:02 AM    Prescriptions: VIAGRA 100 MG  TABS (SILDENAFIL CITRATE) use as needed as directed  #30 x 3   Entered by:   Melody Comas   Authorized by:   Shaune Leeks MD   Signed by:   Melody Comas on 06/16/2010   Method used:   Faxed to ...       [facility]) (mail-order)             ,          Ph: [phone]       Fax: [phone]   RxID:   [id number] VIAGRA 100 MG  TABS (SILDENAFIL CITRATE) use as needed as directed  #30 x 3   Entered and Authorized by:   Shaune Leeks MD   Signed by:   Shaune Leeks MD on 06/16/2010   Method used:   Telephoned to ...       [facility] Fax) (mail-order)             ,          Ph: [phone]       Fax: [phone]   RxID:   [id number]

## 2010-08-17 NOTE — Assessment & Plan Note (Signed)
Summary: ALLERGIES/DLO   Vital Signs:  Patient Profile:   50 Years Old Male Height:     67 inches Weight:      210 pounds Temp:     98.5 degrees F oral Pulse rate:   64 / minute Pulse rhythm:   regular BP sitting:   120 / 80  (left arm) Cuff size:   large  Vitals Entered By: Providence Crosby (Nov 21, 2007 8:53 AM)                 Chief Complaint:  URI SYMPTOMS GETTING WORSE.  History of Present Illness: Seen last week for conjunctivitis...is waking in night with  asthma like sxs, wheezing.  Had asthma as child but not for long time. Clarinex stopped sneezing, but having congestion.  Learned he can't take cold remedies.    Prior Medications Reviewed Using: Patient Recall  Current Allergies (reviewed today): PENICILLIN      Physical Exam  General:     Well-developed,well-nourished,in no acute distress; alert,appropriate and cooperative throughout examination, congested appearing. Head:     Normocephalic and atraumatic without obvious abnormalities. No apparent alopecia or balding. Sinuses nontender. Eyes:     palp conj minimally inflamed. Ears:     External ear exam shows no significant lesions or deformities.  Otoscopic examination reveals clear canals, tympanic membranes are intact bilaterally without bulging, retraction, inflammation or discharge. Hearing is grossly normal bilaterally. Nose:     mild inflamm bilat. Mouth:     Oral mucosa and oropharynx without lesions or exudates.  Teeth in good repair. Neck:     No deformities, masses, or tenderness noted. Chest Wall:     No deformities, masses, tenderness or gynecomastia noted. Lungs:     mild restricted, decreased air flow, no wheezing heard.    Impression & Recommendations:  Problem # 1:  ALLERGIC RHINITIS (ICD-477.9) Assessment: Unchanged Add Flonase one subcutaneously each nostr. His updated medication list for this problem includes:    Clarinex 5 Mg Tabs (Desloratadine) .Marland Kitchen... 1 once daily for  congestion    Flonase 50 Mcg/act Susp (Fluticasone propionate) ..... One subcutaneously each nostr once daily Discussed use of allergy medications and environmental measures.   Problem # 2:  BRONCHITIS, ALLERGIC (ICD-493.90) Assessment: New Trial Advair  His updated medication list for this problem includes:    Advair Diskus 100-50 Mcg/dose Misc (Fluticasone-salmeterol) ..... One inh two times a day PFT next time or next season.   Problem # 3:  HYPERTENSION (ICD-401.9) Assessment: Improved  His updated medication list for this problem includes:    Atenolol 25 Mg Tabs (Atenolol) ..... One by mouth bid  BP today: 120/80 Prior BP: 168/93 (11/14/2007)   Complete Medication List: 1)  Atenolol 25 Mg Tabs (Atenolol) .... One by mouth bid 2)  Lipitor 80 Mg Tabs (Atorvastatin calcium) .... One by mouth qd 3)  Colchicine 0.6 Mg Tabs (Colchicine) .... One by mouth two times a day prn 4)  Viagra 100 Mg Tabs (Sildenafil citrate) .... Use as needed as directed 5)  Naprosyn 500 Mg Tabs (Naproxen) .... One tab by mouth two times a day as needed. 6)  Clarinex 5 Mg Tabs (Desloratadine) .Marland Kitchen.. 1 once daily for congestion 7)  Sodium Sulamyd 10%  .... Gtts 2 qid both eyes for 1 wk 8)  Flonase 50 Mcg/act Susp (Fluticasone propionate) .... One subcutaneously each nostr once daily 9)  Advair Diskus 100-50 Mcg/dose Misc (Fluticasone-salmeterol) .... One inh two times a day   Patient  Instructions: 1)  RTC if sxs incr.   ]

## 2010-08-17 NOTE — Progress Notes (Signed)
Summary: Prior Auth/ [facility]  Phone Note From Pharmacy   Caller: [facility] Delivery Fax) Call For: Dr. Hetty Ely  Summary of Call: Form on your desk for more info on Viagra prior-auth. Initial call taken by: Mervin Hack CMA Duncan Dull),  September 21, 2009 12:04 PM  Follow-up for Phone Call        Completed. Follow-up by: Shaune Leeks MD,  September 21, 2009 2:02 PM  Additional Follow-up for Phone Call Additional follow up Details #1::        Completed form faxed back to [facility] as instructed. Additional Follow-up by: Sydell Axon LPN,  September 21, 2009 2:33 PM     Appended Document: Prior Auth/ [facility] Received denial for Viagra.  Patient notified.

## 2010-08-17 NOTE — Assessment & Plan Note (Signed)
Summary: CPX/CLE   Vital Signs:  Patient Profile:   50 Years Old Male Height:     67 inches Weight:      204 pounds Temp:     98.6 degrees F oral Pulse rate:   72 / minute Pulse rhythm:   regular BP sitting:   140 / 80  (right arm) Cuff size:   regular  Vitals Entered By: Sydell Axon (April 23, 2007 2:05 PM)                 Chief Complaint:  30 minute checkup.  History of Present Illness: Last here in April for hemms with no recurrence since. No complaints today except for shoulder pain in rotator cuff area. Was rowing regularly but has stopped.  Current Allergies (reviewed today): PENICILLIN   Family History:    Father: A 4 HTN, stroke    Mother: dec 77 Stroke    Sister A Htn Hyperchol    Brother died from alcoholism and organ failure   Risk Factors:  Tobacco use:  quit    Year started:  rare    Year quit:  2006    Pack-years:  rare cigar Passive smoke exposure:  no Drug use:  no HIV high-risk behavior:  no Caffeine use:  1 drinks per day Alcohol use:  yes    Type:  beers 2-3 per day    Drinks per day:  3    Has patient --       Felt need to cut down:  yes       Been annoyed by complaints:  no       Felt guilty about drinking:  no       Needed eye opener in the morning:  no    Counseled to quit/cut down alcohol use:  yes Exercise:  yes    Times per week:  5    Type:  elliptical Seatbelt use:  100 %   Review of Systems  General      Complains of sweats.      Denies chills, fatigue, fever, loss of appetite, malaise, sleep disorder, weakness, and weight loss.      at night  Eyes      Denies blurring, discharge, double vision, eye irritation, eye pain, halos, itching, light sensitivity, red eye, vision loss-1 eye, and vision loss-both eyes.  ENT      Denies decreased hearing, difficulty swallowing, ear discharge, earache, hoarseness, nasal congestion, nosebleeds, postnasal drainage, ringing in ears, sinus pressure, and sore throat.  CV   Denies bluish discoloration of lips or nails, chest pain or discomfort, difficulty breathing at night, difficulty breathing while lying down, fainting, fatigue, leg cramps with exertion, lightheadness, near fainting, palpitations, shortness of breath with exertion, swelling of feet, swelling of hands, and weight gain.  Resp      Denies chest discomfort, chest pain with inspiration, cough, coughing up blood, excessive snoring, hypersomnolence, morning headaches, pleuritic, shortness of breath, sputum productive, and wheezing.  GI      Denies abdominal pain, bloody stools, change in bowel habits, constipation, dark tarry stools, diarrhea, excessive appetite, gas, hemorrhoids, indigestion, loss of appetite, nausea, vomiting, vomiting blood, and yellowish skin color.  GU      Denies decreased libido, discharge, dysuria, erectile dysfunction, genital sores, hematuria, incontinence, nocturia, urinary frequency, and urinary hesitancy.  MS      Complains of joint pain.      Denies joint redness, joint swelling, loss of strength, low  back pain, mid back pain, muscle aches, muscle , cramps, muscle weakness, stiffness, and thoracic pain.      throughout  Derm      Denies changes in color of skin, changes in nail beds, dryness, excessive perspiration, flushing, hair loss, insect bite(s), itching, lesion(s), poor wound healing, and rash.  Neuro      Denies brief paralysis, difficulty with concentration, disturbances in coordination, falling down, headaches, inability to speak, memory loss, numbness, poor balance, seizures, sensation of room spinning, tingling, tremors, visual disturbances, and weakness.   Physical Exam  General:     Well-developed,well-nourished,in no acute distress; alert,appropriate and cooperative throughout examination Head:     Normocephalic and atraumatic without obvious abnormalities. No apparent alopecia or balding. Eyes:     Conjunctiva clear bilaterally.  Ears:      External ear exam shows no significant lesions or deformities.  Otoscopic examination reveals clear canals, tympanic membranes are intact bilaterally without bulging, retraction, inflammation or discharge. Hearing is grossly normal bilaterally. Nose:     External nasal examination shows no deformity or inflammation. Nasal mucosa are pink and moist without lesions or exudates. Mouth:     Oral mucosa and oropharynx without lesions or exudates.  Teeth in good repair. Neck:     No deformities, masses, or tenderness noted. Chest Wall:     No deformities, masses, tenderness or gynecomastia noted. Breasts:     No masses or gynecomastia noted Lungs:     Normal respiratory effort, chest expands symmetrically. Lungs are clear to auscultation, no crackles or wheezes. Heart:     Normal rate and regular rhythm. S1 and S2 normal without gallop, murmur, click, rub or other extra sounds. Abdomen:     Bowel sounds positive,abdomen soft and non-tender without masses, organomegaly or hernias noted. Rectal:     No external abnormalities noted. Normal sphincter tone. No rectal masses or tenderness. G neg. Genitalia:     Testes bilaterally descended without nodularity, tenderness or masses. No scrotal masses or lesions. No penis lesions or urethral discharge. Prostate:     Prostate gland firm and smooth, no enlargement, nodularity, tenderness, mass, asymmetry or induration. 40 gms. Msk:     No deformity or scoliosis noted of thoracic or lumbar spine.   Pulses:     R and L carotid,radial,femoral,dorsalis pedis and posterior tibial pulses are full and equal bilaterally Extremities:     No clubbing, cyanosis, edema, or deformity noted with normal full range of motion of all joints.  Mild doscomfort in the rotator cuff area. Neurologic:     No cranial nerve deficits noted. Station and gait are normal. Plantar reflexes are down-going bilaterally. DTRs are symmetrical throughout. Sensory, motor and coordinative  functions appear intact. Skin:     Intact without suspicious lesions or rashes Cervical Nodes:     No lymphadenopathy noted Inguinal Nodes:     No significant adenopathy Psych:     Cognition and judgment appear intact. Alert and cooperative with normal attention span and concentration. No apparent delusions, illusions, hallucinations    Impression & Recommendations:  Problem # 1:  HEALTH MAINTENANCE EXAM (ICD-V70.0) Assessment: Comment Only  Problem # 2:  Hx of HYPERCHOLESTEROLEMIA (ICD-272.0) Assessment: Improved Great numbers His updated medication list for this problem includes:    Lipitor 80 Mg Tabs (Atorvastatin calcium) ..... One by mouth qd   Problem # 3:  HYPERTENSION (ICD-401.9) Monitors at home. His updated medication list for this problem includes:    Atenolol 25 Mg Tabs (  Atenolol) ..... One by mouth bid  BP today: 140/80   Problem # 4:  GOUT (ICD-274.9) Assessment: Unchanged Doing ok with symptomatic trmt when needed. His updated medication list for this problem includes:    Colchicine 0.6 Mg Tabs (Colchicine) ..... One by mouth two times a day prn   Problem # 5:  GERD (ICD-530.81) Assessment: Improved Stable, using prilosec as needed.  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  Complete Medication List: 1)  Atenolol 25 Mg Tabs (Atenolol) .... One by mouth bid 2)  Lipitor 80 Mg Tabs (Atorvastatin calcium) .... One by mouth qd 3)  Colchicine 0.6 Mg Tabs (Colchicine) .... One by mouth two times a day prn 4)  Viagra 100 Mg Tabs (Sildenafil citrate) .... Use as needed as directed   Patient Instructions: 1)  RTC 2mos bp check 2)  O/W cont curr meds.    Prescriptions: ATENOLOL 25 MG  TABS (ATENOLOL) one by mouth bid  #60 x 12   Entered and Authorized by:   Shaune Leeks MD   Signed by:   Shaune Leeks MD on 04/23/2007   Method used:    Print then Give to Patient   RxID:   [id number]  ] Current Allergies (reviewed today): PENICILLIN  Appended Document: CPX/CLE      Current Allergies: PENICILLIN        Complete Medication List: 1)  Atenolol 25 Mg Tabs (Atenolol) .... One by mouth bid 2)  Lipitor 80 Mg Tabs (Atorvastatin calcium) .... One by mouth qd 3)  Colchicine 0.6 Mg Tabs (Colchicine) .... One by mouth two times a day prn 4)  Viagra 100 Mg Tabs (Sildenafil citrate) .... Use as needed as directed     Prescriptions: ATENOLOL 25 MG  TABS (ATENOLOL) one by mouth bid  #90 x 3   Entered and Authorized by:   Shaune Leeks MD   Signed by:   Shaune Leeks MD on 05/21/2007   Method used:   Print then Give to Patient   RxID:   [id number]  ]

## 2010-08-17 NOTE — Progress Notes (Signed)
Summary: atenolol rx  Phone Note Call from Patient Call back at Home Phone [phone]   Caller: Patient Call For: schaller Summary of Call: pt says his mail order pharmacy has not released rx , pharmacy needs to be contacted to find out what is wrong with rx. Initial call taken by: Liane Comber,  June 04, 2007 11:10 AM  Follow-up for Phone Call        spoke with pharmacy ([facility] [phone]) and called in new rx the quantity was wrong ..................................................................Marland KitchenLiane Comber  June 04, 2007 2:54 PM

## 2010-08-17 NOTE — Letter (Signed)
Summary: Nadara Eaton letter  [place] [facility]  [address] [place], [place] [address]   Phone: [phone]  Fax: [phone]       02/22/2010 MRN: [id number]  ARZELL MCGEEHAN [address] [place], [place]  [address]  Dear Mr. Clearnce [facility] [place] and [place] announce the retirement of Arta Silence, M.D., from full-time practice at the [facility] office effective January 14, 2010 and his plans of returning part-time.  It is important to Dr. Hetty Ely and to our practice that you understand that [facility] has seven physicians in our office for your health care needs.  We will continue to offer the same exceptional care that you have today.    Dr. Hetty Ely has spoken to many of you about his plans for retirement and returning part-time in the fall.   We will continue to work with you through the transition to schedule appointments for you in the office and meet the high standards that [facility] is committed to.   Again, it is with great pleasure that we share the news that Dr. Hetty Ely will return to [facility] at [facility] in October of 2011 with a reduced schedule.    If you have any questions, or would like to request an appointment with one of our physicians, please call us at [phone] and press the option for Scheduling an appointment.  We take pleasure in providing you with excellent patient care and look forward to seeing you at your next office visit.  Our [facility] Physicians are:  Tillman Abide, M.D. Laurita Quint, M.D. Roxy Manns, M.D. Kerby Nora, M.D. Hannah Beat, M.D. Ruthe Mannan, M.D. We proudly welcomed Raechel Ache, M.D. and Eustaquio Boyden, M.D. to the practice in July/August 2011.  Sincerely,  [facility]

## 2011-01-07 ENCOUNTER — Other Ambulatory Visit: Payer: Self-pay | Admitting: *Deleted

## 2011-01-07 MED ORDER — SIMVASTATIN 40 MG PO TABS: 40.0000 mg | ORAL_TABLET | Freq: Every evening | ORAL | Status: DC

## 2011-01-07 MED ORDER — ATENOLOL 25 MG PO TABS: 25.0000 mg | ORAL_TABLET | Freq: Two times a day (BID) | ORAL | Status: DC

## 2011-01-07 NOTE — Telephone Encounter (Signed)
Per old records, he was taking the atenolol bid.  Please verify with pt.  The simvastatin should be cheaper.  Please have pt schedule physical.  Thanks.

## 2011-01-07 NOTE — Telephone Encounter (Signed)
Patient notified. He says that he will call back to schedule cpx. He is currently taking 1 tablet of atenolol daily, but takes bid when blood pressure is elevated. Rx left up front for pick up.

## 2011-01-07 NOTE — Telephone Encounter (Signed)
Patient is currently taking lipitor 40 mg daily and he says that his insurance no longer covers it and it is very expensive for him. He is asking if he could switch to generic. He is asking asking for written scripts for both of these rx.

## 2011-01-12 ENCOUNTER — Other Ambulatory Visit: Payer: Self-pay | Admitting: *Deleted

## 2011-01-12 MED ORDER — ATENOLOL 25 MG PO TABS: 25.0000 mg | ORAL_TABLET | Freq: Two times a day (BID) | ORAL | Status: DC

## 2011-01-12 NOTE — Telephone Encounter (Signed)
Hard copy done.  Please fax in.  Pt is due for CPE.

## 2011-01-12 NOTE — Telephone Encounter (Signed)
[facility] for new rx  Is on your desk.

## 2011-01-20 ENCOUNTER — Other Ambulatory Visit: Payer: Self-pay | Admitting: *Deleted

## 2011-01-20 MED ORDER — SIMVASTATIN 40 MG PO TABS: 40.0000 mg | ORAL_TABLET | Freq: Every evening | ORAL | Status: DC

## 2011-04-14 ENCOUNTER — Encounter: Payer: Self-pay | Admitting: Family Medicine

## 2011-04-15 ENCOUNTER — Other Ambulatory Visit (INDEPENDENT_AMBULATORY_CARE_PROVIDER_SITE_OTHER): Payer: Managed Care, Other (non HMO)

## 2011-04-15 DIAGNOSIS — E7800 Pure hypercholesterolemia, unspecified: Secondary | ICD-10-CM

## 2011-04-15 DIAGNOSIS — Z125 Encounter for screening for malignant neoplasm of prostate: Secondary | ICD-10-CM

## 2011-04-15 DIAGNOSIS — I10 Essential (primary) hypertension: Secondary | ICD-10-CM

## 2011-04-20 ENCOUNTER — Ambulatory Visit (INDEPENDENT_AMBULATORY_CARE_PROVIDER_SITE_OTHER): Payer: Managed Care, Other (non HMO) | Admitting: Family Medicine

## 2011-04-20 ENCOUNTER — Encounter: Payer: Self-pay | Admitting: Family Medicine

## 2011-04-20 DIAGNOSIS — R7309 Other abnormal glucose: Secondary | ICD-10-CM

## 2011-04-20 DIAGNOSIS — I10 Essential (primary) hypertension: Secondary | ICD-10-CM

## 2011-04-20 DIAGNOSIS — E875 Hyperkalemia: Secondary | ICD-10-CM

## 2011-04-20 DIAGNOSIS — K219 Gastro-esophageal reflux disease without esophagitis: Secondary | ICD-10-CM

## 2011-04-20 DIAGNOSIS — N529 Male erectile dysfunction, unspecified: Secondary | ICD-10-CM

## 2011-04-20 DIAGNOSIS — E7800 Pure hypercholesterolemia, unspecified: Secondary | ICD-10-CM

## 2011-04-20 DIAGNOSIS — M109 Gout, unspecified: Secondary | ICD-10-CM

## 2011-04-20 NOTE — Assessment & Plan Note (Signed)
Well controlled. Cont curr meds. BP Readings from Last 3 Encounters:  04/20/11 118/76  08/13/09 122/78  10/27/08 130/90

## 2011-04-20 NOTE — Assessment & Plan Note (Signed)
Doing well. Has reallly watched his diet and stopped supplements.

## 2011-04-20 NOTE — Patient Instructions (Signed)
Cont curr meds and lifestyle. Stay active. RTC one year for PE recheck.

## 2011-04-20 NOTE — Assessment & Plan Note (Signed)
Trigs minimally high, o/w all ok. Discussed at length. His risk is low, continue current meds and lifestyle. Recheck again next year for progression.

## 2011-04-20 NOTE — Progress Notes (Signed)
  Subjective:    Patient ID: Stephen [name], male    DOB: 09-14-1960, 50 y.o.   MRN: [id number]  HPI Pt here for Comp Exam. His foot is doing well. He is working hard on diet and exercise. He is working hard to avoid gouty sxs now.  He has no complaints today and feels well except for joint aches.  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   Review of Systems  Constitutional: Negative for fever, chills, diaphoresis, appetite change, fatigue and unexpected weight change.  HENT: Negative for hearing loss, ear pain, tinnitus and ear discharge.   Eyes: Negative for pain, discharge and visual disturbance.  Respiratory: Negative for cough, shortness of breath and wheezing.   Cardiovascular: Negative for chest pain and palpitations.       No SOB w/ exertion  Gastrointestinal: Negative for nausea, vomiting, abdominal pain, diarrhea, constipation and blood in stool.       No heartburn or swallowing problems.  Genitourinary: Negative for dysuria, frequency and difficulty urinating.       No nocturia  Musculoskeletal: Negative for myalgias, back pain and arthralgias.  Skin: Negative for rash.       No itching or dryness.  Neurological: Negative for tremors and numbness.       No tingling or balance problems.  Hematological: Negative for adenopathy. Does not bruise/bleed easily.  Psychiatric/Behavioral: Negative for dysphoric mood and agitation.       Objective:   Physical Exam  Constitutional: He is oriented to person, place, and time. He appears well-developed and well-nourished. No distress.  HENT:  Head: Normocephalic and atraumatic.  Right Ear: External ear normal.  Left Ear: External ear normal.  Nose: Nose normal.  Mouth/Throat: Oropharynx is clear and moist.  Eyes: Conjunctivae and EOM are normal. Pupils are equal, round, and reactive to light. Right eye exhibits no  discharge. Left eye exhibits no discharge. No scleral icterus.  Neck: Normal range of motion. Neck supple. No thyromegaly present.  Cardiovascular: Normal rate, regular rhythm, normal heart sounds and intact distal pulses.   No murmur heard. Pulmonary/Chest: Effort normal and breath sounds normal. No respiratory distress. He has no wheezes.  Abdominal: Soft. Bowel sounds are normal. He exhibits no distension and no mass. There is no tenderness. There is no rebound and no guarding.  Genitourinary: Penis normal.  Musculoskeletal: Normal range of motion. He exhibits no edema.  Lymphadenopathy:    He has no cervical adenopathy.  Neurological: He is alert and oriented to person, place, and time. Coordination normal.  Skin: Skin is warm and dry. No rash noted. He is not diaphoretic.  Psychiatric: He has a normal mood and affect. His behavior is normal. Judgment and thought content normal.          Assessment & Plan:  HMPE

## 2011-04-20 NOTE — Assessment & Plan Note (Signed)
Euglycemic. Cont active lidestyle, current diet regimen and cont monitoring yearly.

## 2011-04-20 NOTE — Assessment & Plan Note (Signed)
Stable

## 2011-04-20 NOTE — Assessment & Plan Note (Signed)
Viagra works well. No complaints. Continue.

## 2011-04-20 NOTE — Assessment & Plan Note (Signed)
Improved significantly. Cont lifestyle.

## 2011-06-14 ENCOUNTER — Other Ambulatory Visit: Payer: Self-pay | Admitting: *Deleted

## 2011-06-14 MED ORDER — COLCHICINE 0.6 MG PO TABS: 0.6000 mg | ORAL_TABLET | Freq: Two times a day (BID) | ORAL | Status: DC | PRN

## 2011-06-14 NOTE — Telephone Encounter (Signed)
Received faxed refill request from pharmacy. Is it okay to refill medication? 

## 2011-10-11 ENCOUNTER — Other Ambulatory Visit: Payer: Self-pay

## 2011-10-11 MED ORDER — SILDENAFIL CITRATE 100 MG PO TABS
50.0000 mg | ORAL_TABLET | ORAL | Status: DC | PRN
Start: 2011-10-11 — End: 2012-04-27

## 2011-10-11 NOTE — Telephone Encounter (Signed)
Pt last saw Dr Hetty Ely 04/20/11 for CPX. Pt wants written rx for Viagra. Please call pt when rx is ready at [phone].

## 2011-10-11 NOTE — Telephone Encounter (Signed)
Printed

## 2011-10-11 NOTE — Telephone Encounter (Signed)
LMOVM.  Rx left at front desk for pick up. 

## 2011-10-31 ENCOUNTER — Other Ambulatory Visit: Payer: Self-pay

## 2011-10-31 MED ORDER — HYDROCORTISONE ACETATE 25 MG RE SUPP: 25.0000 mg | Freq: Two times a day (BID) | RECTAL | Status: DC

## 2011-10-31 NOTE — Telephone Encounter (Signed)
Sent in.  plz notify if not improved to come in for ov.

## 2011-10-31 NOTE — Telephone Encounter (Signed)
Patient notified. He will call for appt if no improvement.

## 2011-10-31 NOTE — Telephone Encounter (Signed)
Pt request Anucort suppository (generic Hydrocortisone acetate). Pt last saw Dr Hetty Ely on 04/20/11 and said has not needed supp for hemorrhoids since 2008. Anucort is not on med list. Pt thinks internal hemorrhoids this time. Pt request med sent to [facility]. And can be reached at [phone].

## 2011-11-01 ENCOUNTER — Encounter: Payer: Self-pay | Admitting: Family Medicine

## 2011-11-01 ENCOUNTER — Ambulatory Visit (INDEPENDENT_AMBULATORY_CARE_PROVIDER_SITE_OTHER): Payer: Managed Care, Other (non HMO) | Admitting: Family Medicine

## 2011-11-01 VITALS — BP 164/88 | HR 67 | Temp 98.9°F | Wt 205.0 lb

## 2011-11-01 DIAGNOSIS — K644 Residual hemorrhoidal skin tags: Secondary | ICD-10-CM

## 2011-11-01 MED ORDER — HYDROCORTISONE ACETATE 25 MG RE SUPP: 25.0000 mg | Freq: Three times a day (TID) | RECTAL | Status: AC | PRN

## 2011-11-01 NOTE — Progress Notes (Signed)
H/o hemorrhoid and thrombectomy prev.  Now with new sx, worse in last week or so.  No blood in stool.  No NAV, D.  No h/o hard stools, doesn't have to strain usually for BMs.  He has a healthy diet.    BP has been controlled.    Meds, vitals, and allergies reviewed.   ROS: See HPI.  Otherwise, noncontributory.  nad Rectal exam with small hemorrhoid, not tense, minimally tender and doesn't need thrombectomy.

## 2011-11-01 NOTE — Patient Instructions (Signed)
Use the suppositories up to 3 times a day and this should gradually improve.  Take colace 100mg  a day if needed for hard stools.

## 2011-11-03 ENCOUNTER — Encounter: Payer: Self-pay | Admitting: Family Medicine

## 2011-11-03 DIAGNOSIS — K644 Residual hemorrhoidal skin tags: Secondary | ICD-10-CM | POA: Insufficient documentation

## 2011-11-03 NOTE — Assessment & Plan Note (Signed)
D/w pt about path/phys of hemorrhoids, not indicated for thrombectomy today.  Continue suppositories, colace if needed.  F/u prn.

## 2011-11-14 ENCOUNTER — Telehealth: Payer: Self-pay

## 2011-11-14 MED ORDER — FLUTICASONE-SALMETEROL 100-50 MCG/DOSE IN AEPB: 1.0000 | INHALATION_SPRAY | Freq: Two times a day (BID) | RESPIRATORY_TRACT | Status: DC | PRN

## 2011-11-14 NOTE — Telephone Encounter (Signed)
Pt said allergies bothering him, non productive cough, slight wheezing and hard to get breath at night, no fever. Pt said hx of asthma. Pt last seen 11/01/11. Pt request refill on Advair to [facility] [address]Pt can be reached [phone].

## 2011-11-14 NOTE — Telephone Encounter (Signed)
Patient advised.

## 2011-11-14 NOTE — Telephone Encounter (Signed)
Sent, if not improved the f/u here.  Thanks.

## 2012-02-16 ENCOUNTER — Other Ambulatory Visit: Payer: Self-pay | Admitting: *Deleted

## 2012-02-16 MED ORDER — SIMVASTATIN 40 MG PO TABS: 40.0000 mg | ORAL_TABLET | Freq: Every evening | ORAL | Status: DC

## 2012-02-16 NOTE — Telephone Encounter (Signed)
Faxed refill request   

## 2012-02-16 NOTE — Telephone Encounter (Signed)
Sent.  Due for CPE with labs ahead of time.

## 2012-02-16 NOTE — Telephone Encounter (Signed)
Patient advised.  Call transferred to Carrie to schedule CPE. 

## 2012-04-19 ENCOUNTER — Telehealth: Payer: Self-pay | Admitting: Family Medicine

## 2012-04-19 DIAGNOSIS — Z029 Encounter for administrative examinations, unspecified: Secondary | ICD-10-CM

## 2012-04-19 DIAGNOSIS — E7800 Pure hypercholesterolemia, unspecified: Secondary | ICD-10-CM

## 2012-04-19 DIAGNOSIS — Z125 Encounter for screening for malignant neoplasm of prostate: Secondary | ICD-10-CM

## 2012-04-19 NOTE — Telephone Encounter (Signed)
Patient states that he has cpe labs and physical this month. His employer is requesting that he is checked for tobacco usage when he has labs drawn. He states that he will bring forms for Dr. Para March to fill out at time of appointment.

## 2012-04-19 NOTE — Telephone Encounter (Signed)
Orders are in   Thanks

## 2012-04-22 ENCOUNTER — Other Ambulatory Visit: Payer: Self-pay | Admitting: Family Medicine

## 2012-04-25 ENCOUNTER — Other Ambulatory Visit: Payer: Managed Care, Other (non HMO)

## 2012-04-27 ENCOUNTER — Other Ambulatory Visit: Payer: Self-pay | Admitting: Family Medicine

## 2012-04-27 ENCOUNTER — Ambulatory Visit (INDEPENDENT_AMBULATORY_CARE_PROVIDER_SITE_OTHER): Payer: Managed Care, Other (non HMO) | Admitting: Family Medicine

## 2012-04-27 ENCOUNTER — Encounter: Payer: Self-pay | Admitting: Family Medicine

## 2012-04-27 VITALS — BP 164/94 | HR 63 | Temp 98.5°F | Ht 68.0 in | Wt 210.0 lb

## 2012-04-27 DIAGNOSIS — Z1289 Encounter for screening for malignant neoplasm of other sites: Secondary | ICD-10-CM

## 2012-04-27 DIAGNOSIS — M109 Gout, unspecified: Secondary | ICD-10-CM

## 2012-04-27 DIAGNOSIS — Z029 Encounter for administrative examinations, unspecified: Secondary | ICD-10-CM

## 2012-04-27 DIAGNOSIS — R739 Hyperglycemia, unspecified: Secondary | ICD-10-CM

## 2012-04-27 DIAGNOSIS — I10 Essential (primary) hypertension: Secondary | ICD-10-CM

## 2012-04-27 DIAGNOSIS — N529 Male erectile dysfunction, unspecified: Secondary | ICD-10-CM

## 2012-04-27 DIAGNOSIS — Z125 Encounter for screening for malignant neoplasm of prostate: Secondary | ICD-10-CM

## 2012-04-27 DIAGNOSIS — Z1211 Encounter for screening for malignant neoplasm of colon: Secondary | ICD-10-CM

## 2012-04-27 DIAGNOSIS — E7800 Pure hypercholesterolemia, unspecified: Secondary | ICD-10-CM

## 2012-04-27 DIAGNOSIS — Z0000 Encounter for general adult medical examination without abnormal findings: Secondary | ICD-10-CM

## 2012-04-27 DIAGNOSIS — Z23 Encounter for immunization: Secondary | ICD-10-CM

## 2012-04-27 DIAGNOSIS — R7309 Other abnormal glucose: Secondary | ICD-10-CM

## 2012-04-27 MED ORDER — ATENOLOL 25 MG PO TABS: 25.0000 mg | ORAL_TABLET | Freq: Every day | ORAL | Status: DC

## 2012-04-27 MED ORDER — SIMVASTATIN 40 MG PO TABS: 40.0000 mg | ORAL_TABLET | Freq: Every evening | ORAL | Status: DC

## 2012-04-27 MED ORDER — TADALAFIL 10 MG PO TABS: 5.0000 mg | ORAL_TABLET | Freq: Every day | ORAL | Status: DC | PRN

## 2012-04-27 MED ORDER — COLCHICINE 0.6 MG PO TABS: 0.6000 mg | ORAL_TABLET | Freq: Every day | ORAL | Status: DC

## 2012-04-27 NOTE — Patient Instructions (Addendum)
Check your BP and pulse at home in the mornings.  If consistently >140/>90, then let me know about your readings and pulse.  Go to the lab on the way out.  We'll contact you with your lab report. I'll fill out your forms.  Keep exercising and work on getting off the cigars.   Good luck with the move.   If you have to take colchicine, then stop the simvastatin temporarily.  Take care.

## 2012-04-27 NOTE — Progress Notes (Signed)
CPE- See plan.  Routine anticipatory guidance given to patient.  See health maintenance. Tetanus 2011 Flu shot discussed, done today.  PSA discussed.  No FH.  He agrees to check.   Colon cancer screening.  No FH colon cancer.  D/w patient [id number] for colon cancer screening, including IFOB vs. colonoscopy.  Risks and benefits of both were discussed and patient voiced understanding.  Pt elects for: IFOB.   Labs are pending.   Exercising most days of the week.   Working to quit smoking. Down to 1 cigar a day.   H/o hyperglycemia but no h/o DM2.  A1c pending.  Diet discussed.  Is exercising.    Elevated Cholesterol: Using medications without problems:yes Muscle aches: no Diet compliance:yes Exercise:yes  Hypertension:    Using medication without problems or lightheadedness: yes Chest pain with exertion:no Edema:no Short of breath:no Average home BPs: 140/83 last night.    ED.  Would like to change to cialis due to cost.  viagra was effective.    Gout.  No recent flares.  Had colchicine for prn use.  Much improved with lifestyle changes.    PMH and SH reviewed  Meds, vitals, and allergies reviewed.  Recheck BP 150/100  ROS: See HPI.  Otherwise negative.    GEN: nad, alert and oriented HEENT: mucous membranes moist NECK: supple w/o LA CV: rrr. PULM: ctab, no inc wob ABD: soft, +bs EXT: no edema SKIN: no acute rash

## 2012-04-29 DIAGNOSIS — Z0000 Encounter for general adult medical examination without abnormal findings: Secondary | ICD-10-CM | POA: Insufficient documentation

## 2012-04-29 NOTE — Assessment & Plan Note (Signed)
He'll monitor BP out of clinic and notify me about this, if elevated.

## 2012-04-29 NOTE — Assessment & Plan Note (Signed)
A1c pending.

## 2012-04-29 NOTE — Assessment & Plan Note (Signed)
Routine anticipatory guidance given to patient.  See health maintenance. Tetanus 2011 Flu shot discussed, done today.  PSA discussed.  No FH.  He agrees to check.   Colon cancer screening.  No FH colon cancer.  D/w patient [id number] for colon cancer screening, including IFOB vs. colonoscopy.  Risks and benefits of both were discussed and patient voiced understanding.  Pt elects for: IFOB.   Labs are pending.   Exercising most days of the week.   Working to quit smoking. Down to 1 cigar a day.  Encouraged cessation.

## 2012-04-29 NOTE — Assessment & Plan Note (Signed)
Okay to try cialis, f/u prn.

## 2012-04-29 NOTE — Assessment & Plan Note (Signed)
Rare flare, doing well.

## 2012-04-29 NOTE — Assessment & Plan Note (Signed)
Continue current statin/exericise, work on diet. Labs pending.

## 2012-04-30 ENCOUNTER — Encounter: Payer: Managed Care, Other (non HMO) | Admitting: Family Medicine

## 2012-05-03 ENCOUNTER — Encounter: Payer: Self-pay | Admitting: *Deleted

## 2012-05-03 LAB — COMPREHENSIVE METABOLIC PANEL
ALT: 32 IU/L (ref 0–44)
AST: 23 IU/L (ref 0–40)
Albumin/Globulin Ratio: 1.9 (ref 1.1–2.5)
Albumin: 4.9 g/dL (ref 3.5–5.5)
Alkaline Phosphatase: 92 IU/L (ref 44–102)
BUN/Creatinine Ratio: 16 (ref 9–20)
BUN: 15 mg/dL (ref 6–24)
CO2: 23 mmol/L (ref 19–28)
Calcium: 9.8 mg/dL (ref 8.7–10.2)
Chloride: 98 mmol/L (ref 97–108)
Creatinine, Ser: 0.94 mg/dL (ref 0.76–1.27)
GFR calc Af Amer: 109 mL/min/{1.73_m2} (ref >59)
GFR calc non Af Amer: 94 mL/min/{1.73_m2} (ref >59)
Globulin, Total: 2.6 g/dL (ref 1.5–4.5)
Glucose: 96 mg/dL (ref 65–99)
Potassium: 4.5 mmol/L (ref 3.5–5.2)
Sodium: 139 mmol/L (ref 134–144)
Total Bilirubin: 0.5 mg/dL (ref 0.0–1.2)
Total Protein: 7.5 g/dL (ref 6.0–8.5)

## 2012-05-03 LAB — HEMOGLOBIN A1C
Est. average glucose Bld gHb Est-mCnc: 120 mg/dL
Hgb A1c MFr Bld: 5.8 % — ABNORMAL HIGH (ref 4.8–5.6)

## 2012-05-03 LAB — LIPID PANEL
Chol/HDL Ratio: 3.5 ratio units (ref 0.0–5.0)
Cholesterol, Total: 191 mg/dL (ref 100–199)
HDL: 54 mg/dL (ref >39)
LDL Calculated: 106 mg/dL — ABNORMAL HIGH (ref 0–99)
Triglycerides: 154 mg/dL — ABNORMAL HIGH (ref 0–149)
VLDL Cholesterol Cal: 31 mg/dL (ref 5–40)

## 2012-05-03 LAB — NICOTINE/COTININE METABOLITES
Cotinine: 10.9 ng/mL
Nicotine: NOT DETECTED ng/mL

## 2012-05-03 LAB — PSA: PSA: 0.6 ng/mL (ref 0.0–4.0)

## 2012-05-15 ENCOUNTER — Encounter: Payer: Managed Care, Other (non HMO) | Admitting: Family Medicine

## 2012-05-31 ENCOUNTER — Encounter: Payer: Self-pay | Admitting: *Deleted

## 2012-07-23 LAB — FECAL OCCULT BLOOD, IMMUNOCHEMICAL: Fecal Occult Bld: NEGATIVE

## 2012-07-26 ENCOUNTER — Telehealth: Payer: Self-pay | Admitting: *Deleted

## 2012-07-26 ENCOUNTER — Encounter: Payer: Self-pay | Admitting: *Deleted

## 2012-07-26 ENCOUNTER — Other Ambulatory Visit: Payer: Self-pay | Admitting: Family Medicine

## 2012-07-26 DIAGNOSIS — Z1211 Encounter for screening for malignant neoplasm of colon: Secondary | ICD-10-CM

## 2012-07-26 NOTE — Telephone Encounter (Signed)
Thanks

## 2012-07-26 NOTE — Telephone Encounter (Signed)
Received notice from [facility] that due to a "potential preanalytical handling error associated with a limited of fecal occult blood testing specimen" an additional Ifob test is recommended for any pt whose test was processed between 05/14/2012 and 06/12/2012. Called [facility] at Dr Lianne Bushy request to verify if pt's test was done within that time frame, and to repeat if it was. Per [facility] customer service the test was ordered on 04/27/12, but actually received by lab and processed on 05/28/12.  I ordered ifob in emr and mailed a testing kit to patient.

## 2012-09-10 ENCOUNTER — Ambulatory Visit (INDEPENDENT_AMBULATORY_CARE_PROVIDER_SITE_OTHER): Payer: Managed Care, Other (non HMO) | Admitting: Family Medicine

## 2012-09-10 ENCOUNTER — Encounter: Payer: Self-pay | Admitting: Family Medicine

## 2012-09-10 VITALS — BP 162/88 | HR 60 | Temp 98.3°F | Wt 212.8 lb

## 2012-09-10 DIAGNOSIS — R209 Unspecified disturbances of skin sensation: Secondary | ICD-10-CM

## 2012-09-10 DIAGNOSIS — M549 Dorsalgia, unspecified: Secondary | ICD-10-CM | POA: Insufficient documentation

## 2012-09-10 DIAGNOSIS — R202 Paresthesia of skin: Secondary | ICD-10-CM | POA: Insufficient documentation

## 2012-09-10 MED ORDER — CYCLOBENZAPRINE HCL 10 MG PO TABS: 5.0000 mg | ORAL_TABLET | Freq: Three times a day (TID) | ORAL | Status: DC | PRN

## 2012-09-10 NOTE — Assessment & Plan Note (Signed)
Unclear source, possibly due to loss of transverse arch. Likely unrelated to other sx.  Would follow clinically for now.  He agrees.

## 2012-09-10 NOTE — Patient Instructions (Addendum)
The the flexeril (sedation caution) and heating pad for now.  Massage your back and work on the shoulder positioning exercises.  Let me know if you don't improve (or the toe numbness continues).

## 2012-09-10 NOTE — Progress Notes (Signed)
His BP was improved (130/80s) at home.  He'll calibrate it to make sure this is accurate and if so we won't make any changes.  He's working on his weight via diet and exercise.   Back pain.  Going on for 2 months.  Starts medial to L scapula, can radiate up the back and down the arm. No trauma.  He's been resting w/o much improvement.  occ anterior chest pain that is not exertional and happens with the back pain.  No vomiting.  No fevers.  No cough.  No rash.  He's tried local heat, aleve (heat helps more).  No R sided back/arm sx.   Tingling in the 2nd and 3rd toes on the R foot.  Same with and without shoes.  Going on for 3 weeks.  No pain.  No trauma.  No numbness or weakness in the legs.  He's been running and in the gym at baseline.    Meds, vitals, and allergies reviewed.   ROS: See HPI.  Otherwise, noncontributory.  nad ncat Normal ROM in the neck No midline back pain Muscle spasm noted medial to L scapula L shoulder with normal ROM but + impingement and pain on ext rotation, not with int rotation.  No arm drop Distally nv intact in the LUE R foot with normal inspection except for loss of transverse arch.  Normal sensation on the toes to monofilament testing.  Normal cap refill.

## 2012-09-10 NOTE — Assessment & Plan Note (Signed)
Likely combination of muscle spasm on the muscles medial to the L scap with associated cuff findings related to scapular positioning.  Both can be improved with strengthening the muscles medial to scapula.  Massage and flexeril in the meantime.  Anatomy and rationale d/w pt.  He agrees.

## 2012-10-04 ENCOUNTER — Other Ambulatory Visit: Payer: Self-pay | Admitting: Family Medicine

## 2012-10-04 NOTE — Telephone Encounter (Signed)
Sent!

## 2012-10-04 NOTE — Telephone Encounter (Signed)
Electronic refill request.  Please advise. 

## 2012-12-03 ENCOUNTER — Other Ambulatory Visit: Payer: Self-pay | Admitting: Family Medicine

## 2012-12-04 NOTE — Telephone Encounter (Signed)
I thought Pt had changed to cialis.  If that is still correct, then deny this.  Thanks.

## 2012-12-04 NOTE — Telephone Encounter (Signed)
Electronic refill request.  Please advise. 

## 2012-12-12 MED ORDER — SILDENAFIL CITRATE 100 MG PO TABS: 50.0000 mg | ORAL_TABLET | Freq: Every day | ORAL | Status: DC | PRN

## 2012-12-12 NOTE — Addendum Note (Signed)
Addended by: Eustaquio Boyden on: 12/12/2012 05:41 PM   Modules accepted: Orders, Medications

## 2012-12-12 NOTE — Telephone Encounter (Signed)
Pt called; cialis did not work well and pt wants to try Viagra again to [facility].pt request call back.(pt wants med this week).

## 2012-12-12 NOTE — Telephone Encounter (Signed)
Sent into [facility].  plz notify pt.

## 2012-12-13 NOTE — Telephone Encounter (Signed)
Patient notified

## 2012-12-18 ENCOUNTER — Other Ambulatory Visit: Payer: Self-pay | Admitting: *Deleted

## 2012-12-18 MED ORDER — SILDENAFIL CITRATE 100 MG PO TABS: 50.0000 mg | ORAL_TABLET | Freq: Every day | ORAL | Status: DC | PRN

## 2012-12-18 NOTE — Telephone Encounter (Signed)
Faxed refill request. Please advise.  

## 2012-12-18 NOTE — Telephone Encounter (Signed)
Thanks

## 2012-12-18 NOTE — Telephone Encounter (Signed)
Pt spoke with [facility] and [facility] not receive refill on Viagra sent 12/12/12. Pt request refill resent to [facility]; advised pt done.

## 2012-12-18 NOTE — Addendum Note (Signed)
Addended by: Patience Musca on: 12/18/2012 09:28 AM   Modules accepted: Orders

## 2012-12-19 MED ORDER — SILDENAFIL CITRATE 100 MG PO TABS: 50.0000 mg | ORAL_TABLET | Freq: Every day | ORAL | Status: DC | PRN

## 2012-12-19 NOTE — Telephone Encounter (Signed)
Sent!

## 2013-02-07 ENCOUNTER — Ambulatory Visit (INDEPENDENT_AMBULATORY_CARE_PROVIDER_SITE_OTHER): Payer: Managed Care, Other (non HMO) | Admitting: Family Medicine

## 2013-02-07 ENCOUNTER — Encounter: Payer: Self-pay | Admitting: Family Medicine

## 2013-02-07 VITALS — BP 120/78 | HR 63 | Temp 98.1°F | Ht 66.0 in | Wt 205.0 lb

## 2013-02-07 DIAGNOSIS — Z87891 Personal history of nicotine dependence: Secondary | ICD-10-CM

## 2013-02-07 DIAGNOSIS — R739 Hyperglycemia, unspecified: Secondary | ICD-10-CM

## 2013-02-07 DIAGNOSIS — E7800 Pure hypercholesterolemia, unspecified: Secondary | ICD-10-CM

## 2013-02-07 DIAGNOSIS — I10 Essential (primary) hypertension: Secondary | ICD-10-CM

## 2013-02-07 DIAGNOSIS — R7309 Other abnormal glucose: Secondary | ICD-10-CM

## 2013-02-07 NOTE — Assessment & Plan Note (Signed)
See notes on labs, try to taper BB and he'll follow pulse, BP.

## 2013-02-07 NOTE — Progress Notes (Signed)
Needed f/u for work forms.   See scanned forms.   Hypertension:    Using medication without problems or lightheadedness: yes Chest pain with exertion:no Edema:no Short of breath:no He is losing weight with exercise and diet.  We talked about tapering his BB and he'll follow his BP  Elevated Cholesterol: Using medications without problems:yes Muscle aches: no Diet compliance:yes Exercise: yes  Meds, vitals, and allergies reviewed.   PMH and SH reviewed  ROS: See HPI.  Otherwise negative.    GEN: nad, alert and oriented NECK: supple w/o LA CV: rrr. PULM: ctab, no inc wob EXT: no edema Waist 40"

## 2013-02-07 NOTE — Assessment & Plan Note (Signed)
See notes on labs, continue current meds.

## 2013-02-07 NOTE — Patient Instructions (Addendum)
Go to the lab on the way out.  We'll contact you with your lab report. We'll send in your form.  Try to taper the atenolol and see how you feel.  I would get a flu shot each fall.   Take care.

## 2013-02-07 NOTE — Assessment & Plan Note (Signed)
H/o, see notes on labs.   

## 2013-02-07 NOTE — Addendum Note (Signed)
Addended by: Alvina Chou on: 02/07/2013 08:48 AM   Modules accepted: Orders

## 2013-02-12 ENCOUNTER — Encounter: Payer: Self-pay | Admitting: *Deleted

## 2013-02-12 ENCOUNTER — Encounter: Payer: Self-pay | Admitting: Family Medicine

## 2013-02-12 LAB — BASIC METABOLIC PANEL
BUN/Creatinine Ratio: 15 (ref 9–20)
BUN: 15 mg/dL (ref 6–24)
CO2: 29 mmol/L (ref 18–29)
Calcium: 9.7 mg/dL (ref 8.7–10.2)
Chloride: 102 mmol/L (ref 97–108)
Creatinine, Ser: 0.99 mg/dL (ref 0.76–1.27)
GFR calc Af Amer: 102 mL/min/{1.73_m2} (ref >59)
GFR calc non Af Amer: 88 mL/min/{1.73_m2} (ref >59)
Glucose: 91 mg/dL (ref 65–99)
Potassium: 4.8 mmol/L (ref 3.5–5.2)
Sodium: 142 mmol/L (ref 134–144)

## 2013-02-12 LAB — LIPID PANEL
Chol/HDL Ratio: 3.7 ratio units (ref 0.0–5.0)
Cholesterol, Total: 163 mg/dL (ref 100–199)
HDL: 44 mg/dL (ref >39)
LDL Calculated: 92 mg/dL (ref 0–99)
Triglycerides: 135 mg/dL (ref 0–149)
VLDL Cholesterol Cal: 27 mg/dL (ref 5–40)

## 2013-02-12 LAB — HEMOGLOBIN A1C
Est. average glucose Bld gHb Est-mCnc: 123 mg/dL
Hgb A1c MFr Bld: 5.9 % — ABNORMAL HIGH (ref 4.8–5.6)

## 2013-02-12 LAB — NICOTINE/COTININE METABOLITES
Cotinine: NOT DETECTED ng/mL
Nicotine: NOT DETECTED ng/mL

## 2013-05-23 ENCOUNTER — Other Ambulatory Visit: Payer: Self-pay

## 2013-05-31 ENCOUNTER — Other Ambulatory Visit: Payer: Self-pay | Admitting: Family Medicine

## 2013-05-31 NOTE — Telephone Encounter (Signed)
Electronic refill request.  Please advise. 

## 2013-06-02 NOTE — Telephone Encounter (Signed)
Sent!

## 2013-06-12 ENCOUNTER — Other Ambulatory Visit: Payer: Self-pay | Admitting: Family Medicine

## 2013-06-12 NOTE — Telephone Encounter (Signed)
Pt sent note requesting refill simvastatin and atenolol; med was already refilled to [facility]. Pt will ck with [facility] and pt will cb to schedule.CPX.

## 2013-07-15 ENCOUNTER — Encounter: Payer: Self-pay | Admitting: Family Medicine

## 2013-07-15 ENCOUNTER — Other Ambulatory Visit: Payer: Self-pay | Admitting: Family Medicine

## 2013-07-15 DIAGNOSIS — N529 Male erectile dysfunction, unspecified: Secondary | ICD-10-CM

## 2013-07-29 ENCOUNTER — Other Ambulatory Visit: Payer: Self-pay | Admitting: Family Medicine

## 2013-08-09 ENCOUNTER — Ambulatory Visit (INDEPENDENT_AMBULATORY_CARE_PROVIDER_SITE_OTHER): Payer: Managed Care, Other (non HMO) | Admitting: Podiatry

## 2013-08-09 ENCOUNTER — Ambulatory Visit (INDEPENDENT_AMBULATORY_CARE_PROVIDER_SITE_OTHER): Payer: Managed Care, Other (non HMO)

## 2013-08-09 ENCOUNTER — Encounter: Payer: Self-pay | Admitting: Podiatry

## 2013-08-09 VITALS — BP 162/98 | HR 71 | Resp 16 | Ht 67.0 in | Wt 205.0 lb

## 2013-08-09 DIAGNOSIS — M79609 Pain in unspecified limb: Secondary | ICD-10-CM

## 2013-08-09 DIAGNOSIS — M79673 Pain in unspecified foot: Secondary | ICD-10-CM

## 2013-08-09 DIAGNOSIS — M779 Enthesopathy, unspecified: Secondary | ICD-10-CM

## 2013-08-09 DIAGNOSIS — G5760 Lesion of plantar nerve, unspecified lower limb: Secondary | ICD-10-CM

## 2013-08-09 NOTE — Progress Notes (Signed)
Subjective:     Patient ID: Stephen [name], male   DOB: 05/26/1961, 53 y.o.   MRN: [id number]  Foot Pain   patient points that I like to run a lot and I been getting some numbing burning-like pain right over left foot mostly between the third and fourth toes. States it's been going on for several months and has been relatively stable but he's noticed the left foot has become more tender   Review of Systems  All other systems reviewed and are negative.       Objective:   Physical Exam  Nursing note and vitals reviewed. Constitutional: He is oriented to person, place, and time.  Cardiovascular: Intact distal pulses.   Musculoskeletal: Normal range of motion.  Neurological: He is oriented to person, place, and time.  Skin: Skin is warm.   neurovascular status intact with muscle strength adequate and no equinus condition noted bilateral. Patient's found to have a positive Biagio Borg sign was shooting pains between the third and fourth toes right over left foot with radiating into the adjacent digits    Assessment:     Probable neuroma symptoms versus capsulitis third interspace right over left    Plan:     H&P and x-rays reviewed. Today I did a neural lysis injection right of 1.3 cc purified dehydrated alcohol and Marcaine. Reappoint in 2 weeks

## 2013-08-21 ENCOUNTER — Other Ambulatory Visit (INDEPENDENT_AMBULATORY_CARE_PROVIDER_SITE_OTHER): Payer: Managed Care, Other (non HMO)

## 2013-08-21 DIAGNOSIS — N529 Male erectile dysfunction, unspecified: Secondary | ICD-10-CM

## 2013-08-22 LAB — TESTOSTERONE: Testosterone: 255 ng/dL — ABNORMAL LOW (ref 348–1197)

## 2013-08-23 ENCOUNTER — Ambulatory Visit (INDEPENDENT_AMBULATORY_CARE_PROVIDER_SITE_OTHER): Payer: Managed Care, Other (non HMO) | Admitting: Podiatry

## 2013-08-23 ENCOUNTER — Encounter: Payer: Self-pay | Admitting: Podiatry

## 2013-08-23 VITALS — BP 162/87 | HR 66 | Resp 16

## 2013-08-23 DIAGNOSIS — G5760 Lesion of plantar nerve, unspecified lower limb: Secondary | ICD-10-CM

## 2013-08-23 NOTE — Progress Notes (Signed)
Subjective:     Patient ID: Stephen [name], male   DOB: 28-Aug-1960, 53 y.o.   MRN: [id number]  HPI patient states my right foot feels quite a bit better but still getting some pain between the third and fourth toes with some shooting like discomfort. Left foot just feels a little bit now him and I wanted to see if we could do anything for that   Review of Systems     Objective:   Physical Exam Neurovascular status intact no health history changes noted. Continued discomfort third interspace right which shooting pains and no discomfort left but just persistent mild numbness    Assessment:     Probable neuroma symptomatology right with possible low-grade neuropathy or neuroma symptoms left    Plan:     I want to focus on the area that hurts we'll continue to focus on the right foot. Did sterile prep of the right forefoot injected directly into the nerve root prior to breaking into digital branches 4% dehydrated alcohol Marcaine with epinephrine 1.3 cc

## 2013-08-23 NOTE — Progress Notes (Signed)
Right foot had a huge improvement , but  The toes are numb

## 2013-08-26 ENCOUNTER — Ambulatory Visit (INDEPENDENT_AMBULATORY_CARE_PROVIDER_SITE_OTHER): Payer: Managed Care, Other (non HMO) | Admitting: Family Medicine

## 2013-08-26 ENCOUNTER — Encounter: Payer: Self-pay | Admitting: Family Medicine

## 2013-08-26 VITALS — BP 128/82 | HR 64 | Temp 98.4°F | Ht 67.0 in | Wt 207.5 lb

## 2013-08-26 DIAGNOSIS — I10 Essential (primary) hypertension: Secondary | ICD-10-CM

## 2013-08-26 DIAGNOSIS — E7800 Pure hypercholesterolemia, unspecified: Secondary | ICD-10-CM

## 2013-08-26 DIAGNOSIS — Z0000 Encounter for general adult medical examination without abnormal findings: Secondary | ICD-10-CM

## 2013-08-26 DIAGNOSIS — Z8639 Personal history of other endocrine, nutritional and metabolic disease: Secondary | ICD-10-CM

## 2013-08-26 DIAGNOSIS — Z1211 Encounter for screening for malignant neoplasm of colon: Secondary | ICD-10-CM

## 2013-08-26 DIAGNOSIS — R7309 Other abnormal glucose: Secondary | ICD-10-CM

## 2013-08-26 DIAGNOSIS — E291 Testicular hypofunction: Secondary | ICD-10-CM

## 2013-08-26 DIAGNOSIS — IMO0001 Reserved for inherently not codable concepts without codable children: Secondary | ICD-10-CM | POA: Insufficient documentation

## 2013-08-26 MED ORDER — TESTOSTERONE 50 MG/5GM (1%) TD GEL: 5.0000 g | Freq: Every day | TRANSDERMAL | Status: DC

## 2013-08-26 NOTE — Patient Instructions (Addendum)
Go to the lab on the way out.  We'll contact you with your lab report. Start the testosterone and recheck labs in about 2 months.  If you have concerns in the meantime, then notify me.  Take care.

## 2013-08-26 NOTE — Progress Notes (Signed)
Pre-visit discussion using our clinic review tool. No additional management support is needed unless otherwise documented below in the visit note.  CPE- See plan.  Routine anticipatory guidance given to patient.  See health maintenance. Tetanus 2011 PNA/shingles not due yet.  Discussed.  Flu done 03/2013.  D/w patient [id number] for colon cancer screening, including IFOB vs. colonoscopy.  Risks and benefits of both were discussed and patient voiced understanding.  Pt elects CHE:NIDP.  PSA prev wnl. Prostate cancer screening and PSA options (with potential risks and benefits of testing vs not testing) were discussed along with recent recs/guidelines.  He declined testing PSA at this point.  See testosterone conversation. Living will d/w pt.  Wife designated if incapacitated.    Diet and exercise d/w pt along with low T level.  Encouraged both; he's running on a schedule. Cut calories to 1900 cal a day.  He still isn't losing weight.  Fatigue noted.   ED noted, needing full dose of med now, with less effect recently.   Hypertension:    Using medication without problems or lightheadedness: yes Chest pain with exertion:no Edema:no Short of breath:no Exercise limited by BB  Elevated Cholesterol: Using medications without problems:yes Muscle aches: no Diet compliance:yes Exercise:yes  PMH and SH reviewed  Meds, vitals, and allergies reviewed.   ROS: See HPI.  Otherwise negative.    GEN: nad, alert and oriented HEENT: mucous membranes moist NECK: supple w/o LA CV: rrr. PULM: ctab, no inc wob ABD: soft, +bs EXT: no edema SKIN: no acute rash

## 2013-08-27 ENCOUNTER — Telehealth: Payer: Self-pay | Admitting: Family Medicine

## 2013-08-27 ENCOUNTER — Telehealth: Payer: Self-pay

## 2013-08-27 DIAGNOSIS — E291 Testicular hypofunction: Secondary | ICD-10-CM | POA: Insufficient documentation

## 2013-08-27 MED ORDER — ATENOLOL 25 MG PO TABS: 12.5000 mg | ORAL_TABLET | Freq: Every day | ORAL | Status: DC

## 2013-08-27 NOTE — Assessment & Plan Note (Signed)
Controlled prev, continue current med.

## 2013-08-27 NOTE — Assessment & Plan Note (Signed)
H/o, check A1c with next set of labs.

## 2013-08-27 NOTE — Assessment & Plan Note (Addendum)
Risk and benefits d/w pt re: replacement.  He agrees/wants to start.  Start with testim or similar, recheck labs in 2 months.  D/w pt about sig potential lab abnormalities, inc cbc/cmet/psa changes.  He agrees.  Check TSH with next set of labs.   Addendum- noted that pt has dec in libido.

## 2013-08-27 NOTE — Telephone Encounter (Signed)
Relevant patient education assigned to patient using Emmi. ° °

## 2013-08-27 NOTE — Assessment & Plan Note (Signed)
Routine anticipatory guidance given to patient.  See health maintenance. Tetanus 2011 PNA/shingles not due yet.  Discussed.  Flu done 03/2013.  D/w patient [id number] for colon cancer screening, including IFOB vs. colonoscopy.  Risks and benefits of both were discussed and patient voiced understanding.  Pt elects [id number].  PSA prev wnl. Prostate cancer screening and PSA options (with potential risks and benefits of testing vs not testing) were discussed along with recent recs/guidelines.  He declined testing PSA at this point.  See testosterone conversation. Living will d/w pt.  Wife designated if incapacitated.

## 2013-08-27 NOTE — Telephone Encounter (Signed)
Pt called to ck on status of testosterone PA. Spoke with Jaclyn Shaggy at [facility] and she will fax PA to office. Pt notified of PA process and pt voiced understanding.

## 2013-08-27 NOTE — Assessment & Plan Note (Signed)
Dec BB, continue with exercise and diet.

## 2013-08-30 ENCOUNTER — Other Ambulatory Visit (INDEPENDENT_AMBULATORY_CARE_PROVIDER_SITE_OTHER): Payer: Managed Care, Other (non HMO)

## 2013-08-30 ENCOUNTER — Other Ambulatory Visit: Payer: Self-pay | Admitting: Family Medicine

## 2013-08-30 DIAGNOSIS — E291 Testicular hypofunction: Secondary | ICD-10-CM

## 2013-08-31 LAB — TESTOSTERONE: Testosterone: 210 ng/dL — ABNORMAL LOW (ref 348–1197)

## 2013-09-02 ENCOUNTER — Encounter: Payer: Self-pay | Admitting: Family Medicine

## 2013-09-02 ENCOUNTER — Other Ambulatory Visit: Payer: Self-pay | Admitting: Family Medicine

## 2013-09-04 NOTE — Telephone Encounter (Signed)
Pt wanted to ck on status of testosterone PA; Aniceto Boss advised she is working on [employer]. Pt notified.

## 2013-09-05 NOTE — Telephone Encounter (Signed)
PA paperwork resubmitted with both lab values included, pending notification from insurance company.

## 2013-09-08 ENCOUNTER — Encounter: Payer: Self-pay | Admitting: Family Medicine

## 2013-09-09 ENCOUNTER — Telehealth: Payer: Self-pay

## 2013-09-09 LAB — FECAL OCCULT BLOOD, IMMUNOCHEMICAL: Fecal Occult Bld: NEGATIVE

## 2013-09-09 NOTE — Telephone Encounter (Signed)
Pt left v/m that he has decided to decrease statin med at this time due to denial of testosterone with ins. Co. Pt is not going to pursue testosterone therapy at this time. Pt did not leave contact #.

## 2013-09-09 NOTE — Telephone Encounter (Signed)
Stephen [name] with [facility] received fecal occult test but no requisition. Stephen [name] faxed to [phone] as requested.

## 2013-09-10 ENCOUNTER — Encounter: Payer: Self-pay | Admitting: Family Medicine

## 2013-09-10 NOTE — Telephone Encounter (Signed)
Have not received a response from insurance company. Forms resumitted, with an addendum to symptoms as noted by pt, and a copy of lab results.

## 2013-09-13 ENCOUNTER — Encounter: Payer: Self-pay | Admitting: Podiatry

## 2013-09-13 ENCOUNTER — Ambulatory Visit (INDEPENDENT_AMBULATORY_CARE_PROVIDER_SITE_OTHER): Payer: Managed Care, Other (non HMO) | Admitting: Podiatry

## 2013-09-13 VITALS — BP 143/84 | HR 63 | Resp 16 | Ht 67.0 in | Wt 200.0 lb

## 2013-09-13 DIAGNOSIS — G5760 Lesion of plantar nerve, unspecified lower limb: Secondary | ICD-10-CM

## 2013-09-13 NOTE — Telephone Encounter (Signed)
Testosterone has been approved by [employer]. I advised pt and he said he wanted to wait to start the med until after his trial of D/C statin. Approval paperwork placed in your inbox.

## 2013-09-15 NOTE — Progress Notes (Signed)
Subjective:     Patient ID: Stephen [name], male   DOB: May 29, 1961, 53 y.o.   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 Did a sterile prep and injected directly into the nerve root prior to breaking into digital branches 4% dehydrated alcohol purified along with Marcaine. Reappoint as needed

## 2013-09-19 ENCOUNTER — Encounter: Payer: Self-pay | Admitting: Family Medicine

## 2013-09-19 ENCOUNTER — Other Ambulatory Visit: Payer: Self-pay | Admitting: Family Medicine

## 2013-09-19 DIAGNOSIS — E7800 Pure hypercholesterolemia, unspecified: Secondary | ICD-10-CM

## 2013-10-23 ENCOUNTER — Other Ambulatory Visit: Payer: Self-pay | Admitting: Family Medicine

## 2013-10-23 ENCOUNTER — Encounter: Payer: Self-pay | Admitting: Family Medicine

## 2013-10-23 DIAGNOSIS — E291 Testicular hypofunction: Secondary | ICD-10-CM

## 2013-10-24 ENCOUNTER — Other Ambulatory Visit (INDEPENDENT_AMBULATORY_CARE_PROVIDER_SITE_OTHER): Payer: Managed Care, Other (non HMO)

## 2013-10-24 DIAGNOSIS — I10 Essential (primary) hypertension: Secondary | ICD-10-CM

## 2013-10-24 DIAGNOSIS — Z862 Personal history of diseases of the blood and blood-forming organs and certain disorders involving the immune mechanism: Secondary | ICD-10-CM

## 2013-10-24 DIAGNOSIS — IMO0001 Reserved for inherently not codable concepts without codable children: Secondary | ICD-10-CM

## 2013-10-24 DIAGNOSIS — Z8249 Family history of ischemic heart disease and other diseases of the circulatory system: Secondary | ICD-10-CM

## 2013-10-24 DIAGNOSIS — Z8639 Personal history of other endocrine, nutritional and metabolic disease: Secondary | ICD-10-CM

## 2013-10-24 DIAGNOSIS — R7309 Other abnormal glucose: Secondary | ICD-10-CM

## 2013-10-24 DIAGNOSIS — E291 Testicular hypofunction: Secondary | ICD-10-CM

## 2013-10-24 DIAGNOSIS — N529 Male erectile dysfunction, unspecified: Secondary | ICD-10-CM

## 2013-10-24 DIAGNOSIS — Z0000 Encounter for general adult medical examination without abnormal findings: Secondary | ICD-10-CM

## 2013-10-24 DIAGNOSIS — E7800 Pure hypercholesterolemia, unspecified: Secondary | ICD-10-CM

## 2013-10-24 NOTE — Addendum Note (Signed)
Addended by: Marchia Bond on: 10/24/2013 08:56 AM   Modules accepted: Orders

## 2013-10-25 LAB — COMPREHENSIVE METABOLIC PANEL
ALT: 26 IU/L (ref 0–44)
AST: 20 IU/L (ref 0–40)
Albumin/Globulin Ratio: 1.8 (ref 1.1–2.5)
Albumin: 4.8 g/dL (ref 3.5–5.5)
Alkaline Phosphatase: 85 IU/L (ref 39–117)
BUN/Creatinine Ratio: 18 (ref 9–20)
BUN: 17 mg/dL (ref 6–24)
CO2: 26 mmol/L (ref 18–29)
Calcium: 9.6 mg/dL (ref 8.7–10.2)
Chloride: 100 mmol/L (ref 97–108)
Creatinine, Ser: 0.94 mg/dL (ref 0.76–1.27)
GFR calc Af Amer: 107 mL/min/{1.73_m2} (ref >59)
GFR calc non Af Amer: 93 mL/min/{1.73_m2} (ref >59)
Globulin, Total: 2.6 g/dL (ref 1.5–4.5)
Glucose: 92 mg/dL (ref 65–99)
Potassium: 4.9 mmol/L (ref 3.5–5.2)
Sodium: 142 mmol/L (ref 134–144)
Total Bilirubin: 0.4 mg/dL (ref 0.0–1.2)
Total Protein: 7.4 g/dL (ref 6.0–8.5)

## 2013-10-25 LAB — CBC WITH DIFFERENTIAL/PLATELET
Basophils Absolute: 0 10*3/uL (ref 0.0–0.2)
Basos: 0 %
Eos: 6 %
Eosinophils Absolute: 0.3 10*3/uL (ref 0.0–0.4)
HCT: 46.5 % (ref 37.5–51.0)
Hemoglobin: 15.8 g/dL (ref 12.6–17.7)
Immature Grans (Abs): 0 10*3/uL (ref 0.0–0.1)
Immature Granulocytes: 0 %
Lymphocytes Absolute: 1.9 10*3/uL (ref 0.7–3.1)
Lymphs: 34 %
MCH: 31.1 pg (ref 26.6–33.0)
MCHC: 34 g/dL (ref 31.5–35.7)
MCV: 92 fL (ref 79–97)
Monocytes Absolute: 0.5 10*3/uL (ref 0.1–0.9)
Monocytes: 9 %
Neutrophils Absolute: 2.8 10*3/uL (ref 1.4–7.0)
Neutrophils Relative %: 51 %
RBC: 5.08 x10E6/uL (ref 4.14–5.80)
RDW: 13.4 % (ref 12.3–15.4)
WBC: 5.5 10*3/uL (ref 3.4–10.8)

## 2013-10-25 LAB — TESTOSTERONE: Testosterone: 249 ng/dL — ABNORMAL LOW (ref 348–1197)

## 2013-10-25 LAB — TSH: TSH: 2.37 u[IU]/mL (ref 0.450–4.500)

## 2013-10-25 LAB — HEMOGLOBIN A1C
Est. average glucose Bld gHb Est-mCnc: 120 mg/dL
Hgb A1c MFr Bld: 5.8 % — ABNORMAL HIGH (ref 4.8–5.6)

## 2013-10-25 LAB — PSA: PSA: 0.8 ng/mL (ref 0.0–4.0)

## 2013-10-29 LAB — LIPID PANEL
Chol/HDL Ratio: 5.6 ratio units — ABNORMAL HIGH (ref 0.0–5.0)
Cholesterol, Total: 276 mg/dL — ABNORMAL HIGH (ref 100–199)
HDL: 49 mg/dL (ref >39)
LDL Calculated: 182 mg/dL — ABNORMAL HIGH (ref 0–99)
Triglycerides: 225 mg/dL — ABNORMAL HIGH (ref 0–149)
VLDL Cholesterol Cal: 45 mg/dL — ABNORMAL HIGH (ref 5–40)

## 2013-10-31 ENCOUNTER — Encounter: Payer: Self-pay | Admitting: Family Medicine

## 2013-10-31 ENCOUNTER — Ambulatory Visit (INDEPENDENT_AMBULATORY_CARE_PROVIDER_SITE_OTHER): Payer: Managed Care, Other (non HMO) | Admitting: Family Medicine

## 2013-10-31 VITALS — BP 130/80 | HR 64 | Temp 98.3°F | Wt 205.5 lb

## 2013-10-31 DIAGNOSIS — E291 Testicular hypofunction: Secondary | ICD-10-CM

## 2013-10-31 DIAGNOSIS — E7800 Pure hypercholesterolemia, unspecified: Secondary | ICD-10-CM

## 2013-10-31 NOTE — Patient Instructions (Signed)
Recheck lipids and T in about 6 months.  Fasting AM lab draw. Stay off the simva in the meantime.   Take care.  Glad you are doing better.

## 2013-10-31 NOTE — Progress Notes (Signed)
Pre visit review using our clinic review tool, if applicable. No additional management support is needed unless otherwise documented below in the visit note.  We had potentially attributed the low T, fatigue, toe numbness, concentration changes and fatigue to the statin use.  He stopped the statin and we got labs rechecked.   His exercise stamina/run pacing, achilles pain, toe numbness, concentration are all improved off simva.   Low T persisted.   dw pt.    Meds, vitals, and allergies reviewed.   ROS: See HPI.  Otherwise, noncontributory.  nad ncat Mmm rrr ctab Ext w/o edema

## 2013-11-01 NOTE — Assessment & Plan Note (Signed)
Would replete T and recheck T and lipids in about 6 weeks.  He'll continue diet and exercise.  Off simva for now.  We may need a lower dose of med or a different statin in the future.  He agrees.  Depending on T level, we'll need to set up f/u PSA/CBC/etc after the 6 week lab draw.  He agrees. I am glad that he is much improved in the meantime.

## 2013-12-03 ENCOUNTER — Other Ambulatory Visit: Payer: Self-pay | Admitting: Family Medicine

## 2013-12-03 DIAGNOSIS — E7800 Pure hypercholesterolemia, unspecified: Secondary | ICD-10-CM

## 2013-12-03 DIAGNOSIS — E782 Mixed hyperlipidemia: Secondary | ICD-10-CM

## 2013-12-03 DIAGNOSIS — E291 Testicular hypofunction: Secondary | ICD-10-CM

## 2013-12-12 ENCOUNTER — Other Ambulatory Visit (INDEPENDENT_AMBULATORY_CARE_PROVIDER_SITE_OTHER): Payer: Managed Care, Other (non HMO)

## 2013-12-12 DIAGNOSIS — E782 Mixed hyperlipidemia: Secondary | ICD-10-CM

## 2013-12-12 DIAGNOSIS — E291 Testicular hypofunction: Secondary | ICD-10-CM

## 2013-12-12 DIAGNOSIS — E7800 Pure hypercholesterolemia, unspecified: Secondary | ICD-10-CM

## 2013-12-13 ENCOUNTER — Encounter: Payer: Self-pay | Admitting: Family Medicine

## 2013-12-13 LAB — LIPID PANEL
Chol/HDL Ratio: 5.3 ratio units — ABNORMAL HIGH (ref 0.0–5.0)
Cholesterol, Total: 228 mg/dL — ABNORMAL HIGH (ref 100–199)
HDL: 43 mg/dL (ref >39)
LDL Calculated: 154 mg/dL — ABNORMAL HIGH (ref 0–99)
Triglycerides: 156 mg/dL — ABNORMAL HIGH (ref 0–149)
VLDL Cholesterol Cal: 31 mg/dL (ref 5–40)

## 2013-12-13 LAB — TESTOSTERONE: Testosterone: 267 ng/dL — ABNORMAL LOW (ref 348–1197)

## 2013-12-16 ENCOUNTER — Ambulatory Visit (INDEPENDENT_AMBULATORY_CARE_PROVIDER_SITE_OTHER): Payer: Managed Care, Other (non HMO) | Admitting: Family Medicine

## 2013-12-16 ENCOUNTER — Encounter: Payer: Self-pay | Admitting: Family Medicine

## 2013-12-16 VITALS — BP 138/80 | HR 68 | Temp 98.5°F | Wt 205.0 lb

## 2013-12-16 DIAGNOSIS — E291 Testicular hypofunction: Secondary | ICD-10-CM

## 2013-12-16 MED ORDER — TESTOSTERONE CYPIONATE 200 MG/ML IM SOLN: 200.0000 mg | INTRAMUSCULAR | Status: DC

## 2013-12-16 MED ORDER — SYRINGE (DISPOSABLE) 3 ML MISC
Status: DC
Start: 2013-12-16 — End: 2018-10-31

## 2013-12-16 NOTE — Patient Instructions (Signed)
Get the vial filled (we'll work on the PA if needed) and then get on the nurse schedule with either Benjie Karvonen or [place] for your first injection.  You'll likely give your first shot here and then you'll continue as home.  We'll need to check a trough level before your 4th dose.   Take care.  Glad to see you.

## 2013-12-16 NOTE — Progress Notes (Signed)
Pre visit review using our clinic review tool, if applicable. No additional management support is needed unless otherwise documented below in the visit note.  He is still off statin.  He does have some fatigue.  He is exercising, running more and varying his routine to prevent bothering his knee.   Still with ED, dec in libido.  Labs reviewed.   Meds, vitals, and allergies reviewed.   ROS: See HPI.  Otherwise, noncontributory.  nad Exam deferred.

## 2013-12-17 NOTE — Assessment & Plan Note (Signed)
Change to injection tx, see rx.  He'll come back for 1st injection, then likely will continue tx with self injection at home. rx written for f/u trough T level.  Will adjust dose as needed.  He agrees.  >15 minutes spent in face to face time with patient, >50% spent in counselling or coordination of care.

## 2013-12-18 ENCOUNTER — Encounter: Payer: Self-pay | Admitting: Family Medicine

## 2013-12-23 ENCOUNTER — Encounter: Payer: Self-pay | Admitting: Family Medicine

## 2013-12-25 ENCOUNTER — Telehealth: Payer: Self-pay

## 2013-12-25 NOTE — Telephone Encounter (Signed)
Received incoming fax stating that pt was approved until 06/25/2014  PA authorization# [id number]

## 2013-12-26 ENCOUNTER — Telehealth: Payer: Self-pay | Admitting: *Deleted

## 2013-12-26 ENCOUNTER — Ambulatory Visit (INDEPENDENT_AMBULATORY_CARE_PROVIDER_SITE_OTHER): Payer: Managed Care, Other (non HMO)

## 2013-12-26 DIAGNOSIS — E291 Testicular hypofunction: Secondary | ICD-10-CM

## 2013-12-26 MED ORDER — TESTOSTERONE CYPIONATE 200 MG/ML IM SOLN
200.0000 mg | Freq: Once | INTRAMUSCULAR | Status: AC
  Administered 2013-12-26: 200 mg via INTRAMUSCULAR

## 2013-12-26 NOTE — Telephone Encounter (Signed)
PA approval for Testosterone Cypionate approved through 06/25/2014.  Letter scanned.

## 2013-12-26 NOTE — Telephone Encounter (Signed)
Erroneous encounter

## 2014-02-04 ENCOUNTER — Other Ambulatory Visit: Payer: Self-pay | Admitting: Family Medicine

## 2014-02-21 ENCOUNTER — Ambulatory Visit (INDEPENDENT_AMBULATORY_CARE_PROVIDER_SITE_OTHER): Payer: Managed Care, Other (non HMO) | Admitting: Family Medicine

## 2014-02-21 ENCOUNTER — Encounter: Payer: Self-pay | Admitting: Family Medicine

## 2014-02-21 VITALS — BP 140/88 | HR 62 | Temp 98.2°F | Ht 67.0 in | Wt 204.5 lb

## 2014-02-21 DIAGNOSIS — R7309 Other abnormal glucose: Secondary | ICD-10-CM

## 2014-02-21 DIAGNOSIS — E7800 Pure hypercholesterolemia, unspecified: Secondary | ICD-10-CM

## 2014-02-21 DIAGNOSIS — E291 Testicular hypofunction: Secondary | ICD-10-CM

## 2014-02-21 DIAGNOSIS — R739 Hyperglycemia, unspecified: Secondary | ICD-10-CM

## 2014-02-21 DIAGNOSIS — I10 Essential (primary) hypertension: Secondary | ICD-10-CM

## 2014-02-21 DIAGNOSIS — E790 Hyperuricemia without signs of inflammatory arthritis and tophaceous disease: Secondary | ICD-10-CM

## 2014-02-21 DIAGNOSIS — Z7189 Other specified counseling: Secondary | ICD-10-CM

## 2014-02-21 DIAGNOSIS — Z0000 Encounter for general adult medical examination without abnormal findings: Secondary | ICD-10-CM

## 2014-02-21 DIAGNOSIS — Z789 Other specified health status: Secondary | ICD-10-CM

## 2014-02-21 NOTE — Progress Notes (Signed)
Pre visit review using our clinic review tool, if applicable. No additional management support is needed unless otherwise documented below in the visit note.]  CPE- See plan.  Routine anticipatory guidance given to patient.  See health maintenance. Tetanus 2011 Flu shot done yearly PNA and shingles not due.   IFOB neg 2015 PSA is pending for later in the month.  Living will d/w pt.  Wife is designated if he is incapacitated.  Diet and exercise- doing well with both.  Work is okay, high demand, doing well.   Weight is down, intentional weight loss.    Low T replacement.   His mood is better, more energy.  Exercising more with better pacing.  Libido is better, but with ED.  Effect of med wears off after 3rd week.  Will be due for f/u labs later this month.   PMH and SH reviewed  Meds, vitals, and allergies reviewed.   ROS: See HPI.  Otherwise negative.    GEN: nad, alert and oriented HEENT: mucous membranes moist NECK: supple w/o LA CV: rrr. PULM: ctab, no inc wob ABD: soft, +bs EXT: no edema SKIN: no acute rash

## 2014-02-21 NOTE — Patient Instructions (Signed)
Go to the lab on the way out.  We'll contact you with your lab report.  Come back for labs at the end of the month.   Take care.  Glad to see you.

## 2014-02-23 DIAGNOSIS — Z7189 Other specified counseling: Secondary | ICD-10-CM | POA: Insufficient documentation

## 2014-02-23 NOTE — Assessment & Plan Note (Addendum)
Routine anticipatory guidance given to patient.  See health maintenance. Tetanus 2011 Flu shot done yearly PNA and shingles not due.   IFOB neg 2015 PSA is pending for later in the month.  Living will d/w pt.  Wife is designated if he is incapacitated.  Diet and exercise- doing well with both.  Work is okay, high demand, doing well.   Weight is down, intentional weight loss.   Labs for screening at work pending.

## 2014-02-23 NOTE — Assessment & Plan Note (Signed)
His mood is better, more energy. Exercising more with better pacing. Libido is better, but with ED. Effect of med wears off after 3rd week. Will be due for f/u labs later this month.  See notes on labs at that point.  Depending on tough level, we may need to change to q3 week dosing with same mg/dose.

## 2014-02-26 LAB — NICOTINE/COTININE METABOLITES
Cotinine: 1.1 ng/mL
Nicotine: NOT DETECTED ng/mL

## 2014-02-26 LAB — COMPREHENSIVE METABOLIC PANEL
ALT: 29 IU/L (ref 0–44)
AST: 26 IU/L (ref 0–40)
Albumin/Globulin Ratio: 2.2 (ref 1.1–2.5)
Albumin: 4.7 g/dL (ref 3.5–5.5)
Alkaline Phosphatase: 81 IU/L (ref 39–117)
BUN/Creatinine Ratio: 12 (ref 9–20)
BUN: 14 mg/dL (ref 6–24)
CO2: 25 mmol/L (ref 18–29)
Calcium: 9.8 mg/dL (ref 8.7–10.2)
Chloride: 97 mmol/L (ref 97–108)
Creatinine, Ser: 1.14 mg/dL (ref 0.76–1.27)
GFR calc Af Amer: 85 mL/min/{1.73_m2} (ref >59)
GFR calc non Af Amer: 74 mL/min/{1.73_m2} (ref >59)
Globulin, Total: 2.1 g/dL (ref 1.5–4.5)
Glucose: 82 mg/dL (ref 65–99)
Potassium: 4.6 mmol/L (ref 3.5–5.2)
Sodium: 140 mmol/L (ref 134–144)
Total Bilirubin: 0.3 mg/dL (ref 0.0–1.2)
Total Protein: 6.8 g/dL (ref 6.0–8.5)

## 2014-02-26 LAB — LIPID PANEL
Chol/HDL Ratio: 5.7 ratio units — ABNORMAL HIGH (ref 0.0–5.0)
Cholesterol, Total: 240 mg/dL — ABNORMAL HIGH (ref 100–199)
HDL: 42 mg/dL (ref >39)
LDL Calculated: 158 mg/dL — ABNORMAL HIGH (ref 0–99)
Triglycerides: 201 mg/dL — ABNORMAL HIGH (ref 0–149)
VLDL Cholesterol Cal: 40 mg/dL (ref 5–40)

## 2014-02-26 LAB — HEMOGLOBIN A1C
Est. average glucose Bld gHb Est-mCnc: 117 mg/dL
Hgb A1c MFr Bld: 5.7 % — ABNORMAL HIGH (ref 4.8–5.6)

## 2014-02-27 ENCOUNTER — Encounter: Payer: Self-pay | Admitting: *Deleted

## 2014-02-27 ENCOUNTER — Encounter: Payer: Self-pay | Admitting: Family Medicine

## 2014-03-06 ENCOUNTER — Other Ambulatory Visit (INDEPENDENT_AMBULATORY_CARE_PROVIDER_SITE_OTHER): Payer: Managed Care, Other (non HMO)

## 2014-03-06 DIAGNOSIS — E291 Testicular hypofunction: Secondary | ICD-10-CM

## 2014-03-06 DIAGNOSIS — M109 Gout, unspecified: Secondary | ICD-10-CM

## 2014-03-06 DIAGNOSIS — E790 Hyperuricemia without signs of inflammatory arthritis and tophaceous disease: Secondary | ICD-10-CM

## 2014-03-07 ENCOUNTER — Other Ambulatory Visit: Payer: Self-pay | Admitting: Family Medicine

## 2014-03-07 ENCOUNTER — Encounter: Payer: Self-pay | Admitting: Family Medicine

## 2014-03-07 ENCOUNTER — Telehealth: Payer: Self-pay | Admitting: Family Medicine

## 2014-03-07 DIAGNOSIS — E291 Testicular hypofunction: Secondary | ICD-10-CM

## 2014-03-07 LAB — CBC WITH DIFFERENTIAL/PLATELET
Basophils Absolute: 0 10*3/uL (ref 0.0–0.2)
Basos: 0 %
Eos: 7 %
Eosinophils Absolute: 0.4 10*3/uL (ref 0.0–0.4)
HCT: 48.3 % (ref 37.5–51.0)
Hemoglobin: 16.8 g/dL (ref 12.6–17.7)
Immature Grans (Abs): 0 10*3/uL (ref 0.0–0.1)
Immature Granulocytes: 0 %
Lymphocytes Absolute: 1.6 10*3/uL (ref 0.7–3.1)
Lymphs: 29 %
MCH: 31.2 pg (ref 26.6–33.0)
MCHC: 34.8 g/dL (ref 31.5–35.7)
MCV: 90 fL (ref 79–97)
Monocytes Absolute: 0.4 10*3/uL (ref 0.1–0.9)
Monocytes: 6 %
Neutrophils Absolute: 3.2 10*3/uL (ref 1.4–7.0)
Neutrophils Relative %: 58 %
RBC: 5.38 x10E6/uL (ref 4.14–5.80)
RDW: 14.1 % (ref 12.3–15.4)
WBC: 5.6 10*3/uL (ref 3.4–10.8)

## 2014-03-07 LAB — PSA: PSA: 0.9 ng/mL (ref 0.0–4.0)

## 2014-03-07 LAB — ESTRADIOL: Estradiol: 19 pg/mL (ref 7.6–42.6)

## 2014-03-07 LAB — TESTOSTERONE: Testosterone: 216 ng/dL — ABNORMAL LOW (ref 348–1197)

## 2014-03-07 LAB — LUTEINIZING HORMONE: LH: 3.9 m[IU]/mL (ref 1.7–8.6)

## 2014-03-07 LAB — URIC ACID: Uric Acid: 7.2 mg/dL (ref 3.7–8.6)

## 2014-03-07 MED ORDER — TESTOSTERONE CYPIONATE 200 MG/ML IM SOLN: 200.0000 mg | INTRAMUSCULAR | Status: DC

## 2014-03-07 NOTE — Telephone Encounter (Signed)
Patient dropped off [employer] screening results appeal form to be filled out Put on lugene's desk

## 2014-03-09 ENCOUNTER — Other Ambulatory Visit: Payer: Self-pay | Admitting: Family Medicine

## 2014-03-09 DIAGNOSIS — E291 Testicular hypofunction: Secondary | ICD-10-CM

## 2014-03-10 ENCOUNTER — Encounter: Payer: Self-pay | Admitting: *Deleted

## 2014-03-10 NOTE — Telephone Encounter (Signed)
Faxed and original mailed to patient.

## 2014-03-10 NOTE — Telephone Encounter (Signed)
Form done. Thanks. 

## 2014-03-14 ENCOUNTER — Other Ambulatory Visit: Payer: Managed Care, Other (non HMO)

## 2014-03-27 ENCOUNTER — Other Ambulatory Visit: Payer: Self-pay | Admitting: Family Medicine

## 2014-03-27 NOTE — Telephone Encounter (Signed)
Ok to fill 

## 2014-03-28 NOTE — Telephone Encounter (Signed)
Sent. Thanks.   

## 2014-04-01 ENCOUNTER — Other Ambulatory Visit: Payer: Self-pay | Admitting: Family Medicine

## 2014-04-16 ENCOUNTER — Other Ambulatory Visit: Payer: Self-pay | Admitting: Family Medicine

## 2014-04-16 MED ORDER — TESTOSTERONE CYPIONATE 200 MG/ML IM SOLN: 200.0000 mg | INTRAMUSCULAR | Status: DC

## 2014-04-16 NOTE — Telephone Encounter (Signed)
Please call it in.  We'll do the PA if we have to.  He'll need to make arrangements with uro about his f/u labs.  Thanks.

## 2014-04-16 NOTE — Telephone Encounter (Signed)
Please call in.  Thanks.   

## 2014-04-16 NOTE — Telephone Encounter (Signed)
Verify that he has seen uro in the meantime and that this med shouldn't come through them.  He was referred prev, but I don't see the consult note in EMR.  If rx if via uro, then cancel our rx out. Thanks.

## 2014-04-16 NOTE — Telephone Encounter (Signed)
Patient notified as instructed by telephone. Was advised by patient that he saw Dr. Earle Gell yesterday and he did request that he send Dr. Damita Dunnings his notes. Patient stated that Dr. Junious Silk wants him to have lab work in a month and patient wants to have it done here. Advised patient that we don't do lab work for other doctors. Patient stated that Dr. Junious Silk did not give him this medication. Patient stated that he is going to need a prior authorization on this medication. Patient stated that he has a follow-up appointment with urologist in 3 months. Please advise

## 2014-04-17 ENCOUNTER — Encounter: Payer: Self-pay | Admitting: Family Medicine

## 2014-04-17 NOTE — Telephone Encounter (Signed)
Per Dr. Damita Dunnings cancel the medication refill since patient is now seeing Dr. Junious Silk, see my chart message.

## 2014-05-25 ENCOUNTER — Encounter (HOSPITAL_COMMUNITY)
Admission: EM | Disposition: A | Discharge status: Home / Self Care | Payer: Managed Care, Other (non HMO) | Source: Other Acute Inpatient Hospital | Attending: Interventional Cardiology

## 2014-05-25 ENCOUNTER — Encounter (HOSPITAL_COMMUNITY): Payer: Self-pay | Admitting: *Deleted

## 2014-05-25 ENCOUNTER — Inpatient Hospital Stay (HOSPITAL_COMMUNITY)
Admission: EM | Admit: 2014-05-25 | Discharge: 2014-05-27 | DRG: 246 | Disposition: A | Discharge status: Home / Self Care | Payer: Managed Care, Other (non HMO) | Source: Other Acute Inpatient Hospital | Attending: Interventional Cardiology | Admitting: Interventional Cardiology

## 2014-05-25 ENCOUNTER — Emergency Department: Payer: Self-pay | Admitting: Internal Medicine

## 2014-05-25 DIAGNOSIS — R7303 Prediabetes: Secondary | ICD-10-CM | POA: Diagnosis present

## 2014-05-25 DIAGNOSIS — E7800 Pure hypercholesterolemia, unspecified: Secondary | ICD-10-CM

## 2014-05-25 DIAGNOSIS — R739 Hyperglycemia, unspecified: Secondary | ICD-10-CM | POA: Diagnosis present

## 2014-05-25 DIAGNOSIS — Z720 Tobacco use: Secondary | ICD-10-CM | POA: Diagnosis present

## 2014-05-25 DIAGNOSIS — Z955 Presence of coronary angioplasty implant and graft: Secondary | ICD-10-CM

## 2014-05-25 DIAGNOSIS — E785 Hyperlipidemia, unspecified: Secondary | ICD-10-CM | POA: Diagnosis present

## 2014-05-25 DIAGNOSIS — Z79899 Other long term (current) drug therapy: Secondary | ICD-10-CM

## 2014-05-25 DIAGNOSIS — I2102 ST elevation (STEMI) myocardial infarction involving left anterior descending coronary artery: Secondary | ICD-10-CM | POA: Diagnosis present

## 2014-05-25 DIAGNOSIS — I2109 ST elevation (STEMI) myocardial infarction involving other coronary artery of anterior wall: Secondary | ICD-10-CM | POA: Diagnosis present

## 2014-05-25 DIAGNOSIS — M109 Gout, unspecified: Secondary | ICD-10-CM | POA: Diagnosis present

## 2014-05-25 DIAGNOSIS — I10 Essential (primary) hypertension: Secondary | ICD-10-CM | POA: Diagnosis present

## 2014-05-25 DIAGNOSIS — E291 Testicular hypofunction: Secondary | ICD-10-CM | POA: Diagnosis present

## 2014-05-25 DIAGNOSIS — I2510 Atherosclerotic heart disease of native coronary artery without angina pectoris: Secondary | ICD-10-CM

## 2014-05-25 DIAGNOSIS — I2582 Chronic total occlusion of coronary artery: Secondary | ICD-10-CM | POA: Diagnosis present

## 2014-05-25 DIAGNOSIS — I219 Acute myocardial infarction, unspecified: Secondary | ICD-10-CM

## 2014-05-25 DIAGNOSIS — J45909 Unspecified asthma, uncomplicated: Secondary | ICD-10-CM | POA: Diagnosis present

## 2014-05-25 DIAGNOSIS — Z9861 Coronary angioplasty status: Secondary | ICD-10-CM

## 2014-05-25 DIAGNOSIS — I25119 Atherosclerotic heart disease of native coronary artery with unspecified angina pectoris: Secondary | ICD-10-CM | POA: Diagnosis present

## 2014-05-25 DIAGNOSIS — K219 Gastro-esophageal reflux disease without esophagitis: Secondary | ICD-10-CM | POA: Diagnosis present

## 2014-05-25 HISTORY — DX: ST elevation (STEMI) myocardial infarction involving other coronary artery of anterior wall: I21.09

## 2014-05-25 HISTORY — PX: PERCUTANEOUS CORONARY STENT INTERVENTION (PCI-S): SHX5485

## 2014-05-25 HISTORY — DX: Atherosclerotic heart disease of native coronary artery without angina pectoris: I25.10

## 2014-05-25 HISTORY — DX: Coronary angioplasty status: Z98.61

## 2014-05-25 HISTORY — PX: CORONARY ANGIOPLASTY WITH STENT PLACEMENT: SHX49

## 2014-05-25 HISTORY — PX: LEFT HEART CATHETERIZATION WITH CORONARY ANGIOGRAM: SHX5451

## 2014-05-25 HISTORY — DX: Tobacco use: Z72.0

## 2014-05-25 LAB — CBC
HCT: 50.3 % (ref 40.0–52.0)
HCT: 46 % (ref 39.0–52.0)
HGB: 17.1 g/dL (ref 13.0–18.0)
Hemoglobin: 16.3 g/dL (ref 13.0–17.0)
MCH: 32.4 pg (ref 26.0–34.0)
MCH: 32.3 pg (ref 26.0–34.0)
MCHC: 34 g/dL (ref 32.0–36.0)
MCHC: 35.4 g/dL (ref 30.0–36.0)
MCV: 96 fL (ref 80–100)
MCV: 91.1 fL (ref 78.0–100.0)
Platelet: 271 10*3/uL (ref 150–440)
Platelets: 241 10*3/uL (ref 150–400)
RBC: 5.26 10*6/uL (ref 4.40–5.90)
RBC: 5.05 MIL/uL (ref 4.22–5.81)
RDW: 13.3 % (ref 11.5–14.5)
RDW: 13 % (ref 11.5–15.5)
WBC: 7.3 10*3/uL (ref 3.8–10.6)
WBC: 10.2 10*3/uL (ref 4.0–10.5)

## 2014-05-25 LAB — CK-MB: CK-MB: 2.8 ng/mL (ref 0.5–3.6)

## 2014-05-25 LAB — COMPREHENSIVE METABOLIC PANEL
ALT: 25 U/L (ref 0–53)
AST: 39 U/L — ABNORMAL HIGH (ref 0–37)
Albumin: 3.8 g/dL (ref 3.5–5.2)
Alkaline Phosphatase: 75 U/L (ref 39–117)
Anion gap: 11 (ref 5–15)
BUN: 12 mg/dL (ref 6–23)
CO2: 27 mEq/L (ref 19–32)
Calcium: 8.7 mg/dL (ref 8.4–10.5)
Chloride: 101 mEq/L (ref 96–112)
Creatinine, Ser: 0.99 mg/dL (ref 0.50–1.35)
GFR calc Af Amer: >90 mL/min (ref >90)
GFR calc non Af Amer: >90 mL/min (ref >90)
Glucose, Bld: 98 mg/dL (ref 70–99)
Potassium: 3.8 mEq/L (ref 3.7–5.3)
Sodium: 139 mEq/L (ref 137–147)
Total Bilirubin: 0.3 mg/dL (ref 0.3–1.2)
Total Protein: 6.7 g/dL (ref 6.0–8.3)

## 2014-05-25 LAB — BASIC METABOLIC PANEL
Anion Gap: 7 (ref 7–16)
BUN: 13 mg/dL (ref 7–18)
Calcium, Total: 8.4 mg/dL — ABNORMAL LOW (ref 8.5–10.1)
Chloride: 104 mmol/L (ref 98–107)
Co2: 30 mmol/L (ref 21–32)
Creatinine: 1.19 mg/dL (ref 0.60–1.30)
EGFR (African American): >60
EGFR (Non-African Amer.): >60
Glucose: 93 mg/dL (ref 65–99)
Osmolality: 281 (ref 275–301)
Potassium: 3.7 mmol/L (ref 3.5–5.1)
Sodium: 141 mmol/L (ref 136–145)

## 2014-05-25 LAB — PROTIME-INR
INR: 1
Prothrombin Time: 13.1 secs (ref 11.5–14.7)

## 2014-05-25 LAB — TROPONIN I
Troponin I: 2.76 ng/mL (ref <0.30)
Troponin I: >20 ng/mL (ref <0.30)
Troponin-I: 1.5 ng/mL — ABNORMAL HIGH

## 2014-05-25 LAB — LIPID PANEL
Cholesterol: 208 mg/dL — ABNORMAL HIGH (ref 0–200)
HDL: 46 mg/dL (ref >39)
LDL Cholesterol: 139 mg/dL — ABNORMAL HIGH (ref 0–99)
Total CHOL/HDL Ratio: 4.5 RATIO
Triglycerides: 116 mg/dL (ref <150)
VLDL: 23 mg/dL (ref 0–40)

## 2014-05-25 LAB — PRO B NATRIURETIC PEPTIDE: B-Type Natriuretic Peptide: 28 pg/mL (ref 0–125)

## 2014-05-25 LAB — MRSA PCR SCREENING: MRSA by PCR: NEGATIVE

## 2014-05-25 SURGERY — LEFT HEART CATHETERIZATION WITH CORONARY ANGIOGRAM
Anesthesia: LOCAL

## 2014-05-25 MED ORDER — METOPROLOL TARTRATE 25 MG PO TABS
25.0000 mg | ORAL_TABLET | Freq: Two times a day (BID) | ORAL | Status: DC
  Administered 2014-05-25 (×2): 25 mg via ORAL
  Filled 2014-05-25 (×4): qty 1

## 2014-05-25 MED ORDER — TICAGRELOR 90 MG PO TABS
ORAL_TABLET | ORAL | Status: AC
  Administered 2014-05-25: 90 mg via ORAL
  Filled 2014-05-25: qty 2

## 2014-05-25 MED ORDER — OXYCODONE-ACETAMINOPHEN 5-325 MG PO TABS: 1.0000 | ORAL_TABLET | ORAL | Status: DC | PRN

## 2014-05-25 MED ORDER — FLUTICASONE PROPIONATE 50 MCG/ACT NA SUSP
1.0000 | Freq: Every day | NASAL | Status: DC
  Filled 2014-05-25: qty 16

## 2014-05-25 MED ORDER — NITROGLYCERIN 1 MG/10 ML FOR IR/CATH LAB
INTRA_ARTERIAL | Status: AC
  Filled 2014-05-25: qty 10

## 2014-05-25 MED ORDER — LIDOCAINE HCL (PF) 1 % IJ SOLN
INTRAMUSCULAR | Status: AC
  Filled 2014-05-25: qty 30

## 2014-05-25 MED ORDER — TICAGRELOR 90 MG PO TABS
90.0000 mg | ORAL_TABLET | Freq: Two times a day (BID) | ORAL | Status: DC
  Administered 2014-05-25 – 2014-05-27 (×5): 90 mg via ORAL
  Filled 2014-05-25 (×6): qty 1

## 2014-05-25 MED ORDER — DIAZEPAM 5 MG PO TABS
5.0000 mg | ORAL_TABLET | Freq: Four times a day (QID) | ORAL | Status: DC | PRN
  Administered 2014-05-25 – 2014-05-26 (×3): 5 mg via ORAL
  Filled 2014-05-25 (×3): qty 1

## 2014-05-25 MED ORDER — MOMETASONE FURO-FORMOTEROL FUM 100-5 MCG/ACT IN AERO
2.0000 | INHALATION_SPRAY | Freq: Two times a day (BID) | RESPIRATORY_TRACT | Status: DC
  Filled 2014-05-25: qty 8.8

## 2014-05-25 MED ORDER — METOPROLOL TARTRATE 1 MG/ML IV SOLN
2.5000 mg | INTRAVENOUS | Status: DC | PRN
  Administered 2014-05-25: 2.5 mg via INTRAVENOUS
  Filled 2014-05-25: qty 5

## 2014-05-25 MED ORDER — ACETAMINOPHEN 325 MG PO TABS: 650.0000 mg | ORAL_TABLET | ORAL | Status: DC | PRN

## 2014-05-25 MED ORDER — FENTANYL CITRATE 0.05 MG/ML IJ SOLN
INTRAMUSCULAR | Status: AC
Start: 2014-05-25 — End: 2014-05-25
  Filled 2014-05-25: qty 2

## 2014-05-25 MED ORDER — BIVALIRUDIN 250 MG IV SOLR
INTRAVENOUS | Status: AC
  Filled 2014-05-25: qty 250

## 2014-05-25 MED ORDER — ONDANSETRON HCL 4 MG/2ML IJ SOLN: 4.0000 mg | Freq: Four times a day (QID) | INTRAMUSCULAR | Status: DC | PRN

## 2014-05-25 MED ORDER — MIDAZOLAM HCL 2 MG/2ML IJ SOLN
INTRAMUSCULAR | Status: AC
  Filled 2014-05-25: qty 2

## 2014-05-25 MED ORDER — TESTOSTERONE CYPIONATE 200 MG/ML IM SOLN: 200.0000 mg | INTRAMUSCULAR | Status: DC

## 2014-05-25 MED ORDER — COLCHICINE 0.6 MG PO TABS
0.6000 mg | ORAL_TABLET | Freq: Every day | ORAL | Status: DC
  Filled 2014-05-25 (×4): qty 1

## 2014-05-25 MED ORDER — SODIUM CHLORIDE 0.9 % IV SOLN
INTRAVENOUS | Status: AC
  Administered 2014-05-25: 16:00:00 via INTRAVENOUS

## 2014-05-25 MED ORDER — VERAPAMIL HCL 2.5 MG/ML IV SOLN
INTRAVENOUS | Status: AC
  Filled 2014-05-25: qty 2

## 2014-05-25 MED ORDER — HEPARIN (PORCINE) IN NACL 2-0.9 UNIT/ML-% IJ SOLN
INTRAMUSCULAR | Status: AC
  Filled 2014-05-25: qty 1000

## 2014-05-25 MED ORDER — ATORVASTATIN CALCIUM 80 MG PO TABS
80.0000 mg | ORAL_TABLET | Freq: Every day | ORAL | Status: DC
  Administered 2014-05-25: 80 mg via ORAL
  Filled 2014-05-25 (×2): qty 1

## 2014-05-25 MED ORDER — ASPIRIN 81 MG PO CHEW
81.0000 mg | CHEWABLE_TABLET | Freq: Every day | ORAL | Status: DC
  Administered 2014-05-26 – 2014-05-27 (×2): 81 mg via ORAL
  Filled 2014-05-25 (×2): qty 1

## 2014-05-25 NOTE — Plan of Care (Signed)
Problem: Phase I Progression Outcomes Goal: Voiding-avoid urinary catheter unless indicated Outcome: Completed/Met Date Met:  05/25/14 Goal: Vascular site scale level 0 - I Vascular Site Scale Level 0: No bruising/bleeding/hematoma Level I (Mild): Bruising/Ecchymosis, minimal bleeding/ooozing, palpable hematoma < 3 cm Level II (Moderate): Bleeding not affecting hemodynamic parameters, pseudoaneurysm, palpable hematoma > 3 cm Level III (Severe) Bleeding which affects hemodynamic parameters or retroperitoneal hemorrhage  Outcome: Completed/Met Date Met:  05/25/14  Problem: Phase II Progression Outcomes Goal: Tolerating diet Outcome: Completed/Met Date Met:  05/25/14

## 2014-05-25 NOTE — Plan of Care (Signed)
Problem: Phase I Progression Outcomes Goal: Anginal pain relieved Outcome: Completed/Met Date Met:  05/25/14 Goal: Aspirin unless contraindicated Outcome: Completed/Met Date Met:  05/25/14     

## 2014-05-25 NOTE — Progress Notes (Signed)
Patient had been exercising at home when he began to feel chest pain.  He was sent to [facility] from [facility] and went directly to the Cath Lab.  Wife Mechele Claude arrived followed shortly thereafter by her daughter-in-law, Sharyn Lull.  Wife stated that she and the patient are type-A personalities, always on the go, and never slow down. Patient just started a new job and has a lot of stress because of his work. Patient has a family history of heart problems, but this is the first time he has had heart issues. Chaplain offered emotional support and hospitality.  Chaplain oriented family to the process and then left.  A short time later, chaplain went to [place] to check in on patient and family.  Patient was up and talking. He looked tired, but otherwise well and stated he felt much better. Chaplain made family and patient aware of availability of ongoing support.  Please call as needed.   Dorathy Daft [place], [place]

## 2014-05-25 NOTE — Progress Notes (Signed)
CRITICAL VALUE ALERT  Critical value received:  Troponin = 2.76  Date of notification: 05/25/14  Time of notification:  1411  Critical value read back:yes  Nurse who received alert: O. Kaytelyn Glore  MD notified (1st page):  No, S/p cath, expected value  Time of first page:   MD notified (2nd page):  Time of second page:  Responding MD:    Time MD responded:

## 2014-05-25 NOTE — CV Procedure (Signed)
Left Heart Catheterization with Coronary Angiography and PCI Report  Stephen [name]  53 y.o.  male 1961-02-06  Procedure Date: 05/25/2014 Referring Physician: [place] Primary Cardiologist: [facility] Blenda Bridegroom, M.D. Primary Physician: Renford Dills, M.D.  INDICATIONS: Anterior ST elevation myocardial infarction  PROCEDURE: 1. Left heart catheterization; 2. Coronary angiography; 3. Left ventriculography; 4. DES LAD  CONSENT:  The risks, benefits, and details of the procedure were explained in detail to the patient. Risks including death, stroke, heart attack, kidney injury, allergy, limb ischemia, bleeding and radiation injury were discussed.  The patient verbalized understanding and wanted to proceed.  Informed written consent was obtained.  PROCEDURE TECHNIQUE:  After Xylocaine anesthesia a 5 French Slender sheath was placed in the right radial artery with an angiocath and the modified Seldinger technique.  Coronary angiography was done using a 5 F JR 4 and 6 [place] XB LAD 3.5 cm guide catheter.  Left ventriculography was done using the JR 4 catheter and hand injection.   Digital images demonstrate a proximal thrombotic subtotal occlusion in the LAD with distal LAD total occlusion of the apex. TIMI grade 3 flow was noted through the proximal lesion. TIMI grade 0 flow was noted at the apical LAD.  The patient received 4000 units of heparin in the [facility] Emergency Room along with aspirin. After initial angiography, a bivalirudin bolus and infusion was started. A CT was documented to be therapeutic. Brilinta 180 mg was loaded orally.  We then used a Rro-water guidewire to easily crossed the stenosis and LAD. We used a 3.0 x 15 mm balloon to "Dotter" the distal LAD, which reestablished distal flow.  We then positioned and deployed a Resolute Integrity 3 5 x 18 mm drug-eluting stent. This stent was then postdilated with a 3.75 x 15 Davy Emerge to 14 atm. Further postdilatation  was performed with a [place] Euphora 4.0 x 12 mm to 14 atm. The final angiographic result revealed TIMI grade 3 flow with less than <10% stenosis. There was focal 80% apical LAD stenosis. This was not further treated.  Post procedure a wrist band was applied with good hemostasis.   CONTRAST:  Total of 115 cc.  COMPLICATIONS:  none   HEMODYNAMICS:  Aortic pressure 153/92 mmHg; LV pressure 155 /11 mmHg; LVEDP 18 mmHg  ANGIOGRAPHIC DATA:   The left main coronary artery is normal.  The left anterior descending artery is occluded by a 99% thrombotic eccentric proximal stenosis. Total occlusion apical LAD.Marland Kitchen  The left circumflex artery is small and widely patent.  The ramus intermedius is large and trifurcates on the lateral wall.  The right coronary artery is dominant. It contains eccentric proximal 40-50% narrowing. The PDA is large and reaches the left ventricular apex. One large bifurcating left ventricular branch arises distally and is widely patent.  LEFT VENTRICULOGRAM:  Left ventricular angiogram was done in the 30 RAO projection and revealed mild distal anteroapical hypokinesis. EF 50%.  PCI RESULTS:  Proximal LAD is 99% obstructed with evidence of thrombus. There was distal embolization of the distal LAD with total occlusion at the apex. After angioplasty and stenting the proximal lesion revealed less than 10% obstruction. There was a focal eccentric 90% apical LAD lesion due to thrombus. TIMI grade 3 flow was noted throughout the LAD territory. Final stent diameter of 4.0 mm.  IMPRESSIONS:  1. Anterior ST elevation myocardial infarction with 45-50 minutes of ischemic time. The patient had spontaneous recanalization in the first angiographic images revealed a  severely thrombotic proximal LAD 99% stenosis with TIMI grade 3 flow and apical occlusion due to distal embolus.  2. Successful proximal LAD angioplasty and stenting with reduction of 99% proximal stenosis to less than 10% with TIMI  grade III flow.  3. Widely patent circumflex and ramus intermedius. The proximal dominant RCA contains eccentric 40-50% narrowing.  4. Mild anteroapical hypokinesis with EF of 50% by ventriculography   RECOMMENDATION:   Accelerated post MI course with anticipated discharge 48 hours hence. Statin therapy with eventual conversion to PSK-9 agent as an outpatient. Metoprolol 25 mg twice a day and further titration for blood pressure control as needed. Dual antiplatelet therapy for at least 1 year. Avoid nitrate therapy for the next 36-48 hours due to recent use of Cialis. Ambulate later this evening.

## 2014-05-25 NOTE — H&P (Addendum)
Stephen [name] is a 53 y.o. male  Admit Date: 05/25/2014 Referring Physician: [facility] Primary Cardiologist: [facility]ance Blenda Bridegroom, M.D. Primary Physician: Renford Dills, M.D.  Chief complaint / reason for admission: Acute anterior ST elevation myocardial infarction commencing at 9:15 AM  HPI:  53 year old gentleman with no prior history of cardiovascular disease developed severe chest discomfort starting at 9:15 AM. He had a similar but less severe discomfort while running a 5K 24 hours earlier. In the [facility] Emergency Department, EKG demonstrated anterior ST elevation V1 through V5. Nitroglycerin paste was started. He was given aspirin and given 4000 units of IV heparin. He was then transported to the [facility]. I met him in the emergency room where by the time of arrival he was pain-free. The discomfort resolved during transport. He noted that the use of Cialis within the past 24 hours. He has been recently treated for low testosterone. He has erectile dysfunction. He has seasonal allergies. He has had side effects including cognitive impairment and neuropathic symptoms on statin therapy. He does admit to cigar smoking.   PMH:    Past Medical History  Diagnosis Date  . GERD (gastroesophageal reflux disease)   . Gout   . Hypertension   . Elevated glucose   . Seasonal allergies     uses advair in May  . ED (erectile dysfunction)   . Hyperlipidemia   . Achilles tendon injury     right  . Morton's neuroma     2014    PSH:    Past Surgical History  Procedure Laterality Date  . Hospital / asthma      multiple times as child  . Fx l forearm closed fx  as a child   ALLERGIES:   Claritin-d 12 hour; Penicillins; and Simvastatin Prior to Admit Meds:   Prescriptions prior to admission  Medication Sig Dispense Refill Last Dose  . ADVAIR DISKUS 100-50 MCG/DOSE AEPB inhale 1 dose by mouth twice a day as directed 60 each 3   .  atenolol (TENORMIN) 25 MG tablet Take 0.5 tablets (12.5 mg total) by mouth 2 (two) times daily. 90 tablet 1 Taking  . COLCRYS 0.6 MG tablet Take 1 tablet by mouth  daily as needed 90 tablet 0 Taking  . fluticasone (FLONASE) 50 MCG/ACT nasal spray Place 1 spray into both nostrils daily.    Taking  . Naphazoline-Pheniramine (EYE ALLERGY RELIEF OP) Apply to eye as needed.     Taking  . Syringe, Disposable, 3 ML MISC Use every 28 days with 22g 1 inch needle.  Use for testosterone injection.  Disp 25 syringes and 25 needles 25 each 2 Taking  . testosterone cypionate (DEPOTESTOTERONE CYPIONATE) 200 MG/ML injection Inject 1 mL (200 mg total) into the muscle every 21 ( twenty-one) days. 10 mL 0   . VIAGRA 100 MG tablet Take 1/2 to 1 tablet by  mouth daily as needed for  erectile dysfunction 18 tablet 3   . Zinc Gluconate (ZINC COLD RELIEF MT) Use as directed in the mouth or throat.   Taking   Family HX:    Family History  Problem Relation Age of Onset  . Stroke Mother   . Hypertension Father   . Dementia Father   . Stroke Father 18    Multiple  . AAA (abdominal aortic aneurysm) Father   . Hyperlipidemia Sister   . Hypertension Sister   . Alcohol abuse Brother  organ failure  . Prostate cancer Neg Hx   . Colon cancer Neg Hx    Social HX:    History   Social History  . Marital Status: Married    Spouse Name: N/A    Number of Children: 1  . Years of Education: N/A   Occupational History  . [employer]     supervisor of special chemistries   Social History Main Topics  . Smoking status: Former Smoker    Types: Cigars    Quit date: 05/25/2014  . Smokeless tobacco: Never Used  . Alcohol Use: 1.2 oz/week    2 Cans of beer per week     Comment: beer  . Drug Use: No  . Sexual Activity: Yes   Other Topics Concern  . Not on file   Social History Narrative   One son, 1 stepson   From [place]   Works for [employer].    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        ROS : denies transient neurological symptoms. He has never had syncope or arrhythmia. He denies claudication. No orthopnea, PND, or ankle edema. He denies gastrointestinal bleeding. No recent nausea, vomiting, or other GI complaints. No prior significant surgeries.  Physical Exam: Blood pressure 146/86, pulse 67, temperature 98.2 F (36.8 C), temperature source Oral, resp. rate 13, height $RemoveBe'5\' 7"'fahHBymJy$  (1.702 m), weight 207 lb 3.7 oz (94 kg), SpO2 97 %.    Relatively young bearded gentleman with the time of our first encounter is in no acute distress. ECG monitor still demonstrates ST segment abnormality.  Skin is warm and dry. No cyanosis is noted.  HEENT exam is unremarkable. No pallor or jaundice is noted.  Neck exam reveals no JVD. No carotid bruits are heard. Carotid upstrokes 2+ and symmetric.  Chest is clear to auscultation and percussion. No wheezing is heard.  Cardiac exam reveals no gallop, rub, click, murmur, or irregular rhythm. Palpable.  Abdomen is soft. Bowel sounds are normal. No pulsatile masses are noted. No tenderness is noted.  Extremities reveal no edema. Radial pulses, femoral pulses, and posterior tibial pulses are 2+ and symmetric in upper and lower extremities.  The patient is neurologically intact. No focal motor, sensory, or cranial nerve deficits are noted. Cognitive function is normal.  LABS: none available on presentation    Radiology:  No results found.  EKG:  Sinus rhythm with ST elevation V2 through V5 compatible with acute anterior infarction  ASSESSMENT:  1. Acute anterior ST elevation myocardial infarction with clinical evidence of spontaneous reperfusion. Estimated ischemic time 45-50 minutes. 2. History of hypertension 3. Hyperlipidemia, untreated 4. Hyperglycemia, untreated 5. 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   Plan:  1. Emergency coronary angiography and PCI based upon anatomy if needed. Emergency consent  obtained. 2. Avoid nitrates if possible given history of recent Cialis use 3. Risk factor modification   Time spent: 30 minutes,which included initial assessment in the emergency room and continued evaluation on transport to the cath lab which included examination and stabilization while being prepped for cath.  Sinclair Grooms 05/25/2014 12:20 PM

## 2014-05-26 ENCOUNTER — Inpatient Hospital Stay (HOSPITAL_COMMUNITY): Payer: Managed Care, Other (non HMO)

## 2014-05-26 DIAGNOSIS — E291 Testicular hypofunction: Secondary | ICD-10-CM

## 2014-05-26 DIAGNOSIS — Z955 Presence of coronary angioplasty implant and graft: Secondary | ICD-10-CM

## 2014-05-26 DIAGNOSIS — R739 Hyperglycemia, unspecified: Secondary | ICD-10-CM

## 2014-05-26 DIAGNOSIS — I10 Essential (primary) hypertension: Secondary | ICD-10-CM

## 2014-05-26 DIAGNOSIS — R7309 Other abnormal glucose: Secondary | ICD-10-CM

## 2014-05-26 DIAGNOSIS — E785 Hyperlipidemia, unspecified: Secondary | ICD-10-CM

## 2014-05-26 DIAGNOSIS — R7303 Prediabetes: Secondary | ICD-10-CM | POA: Diagnosis present

## 2014-05-26 DIAGNOSIS — Z9861 Coronary angioplasty status: Secondary | ICD-10-CM

## 2014-05-26 LAB — BASIC METABOLIC PANEL WITH GFR
Anion gap: 13 (ref 5–15)
BUN: 10 mg/dL (ref 6–23)
CO2: 25 meq/L (ref 19–32)
Calcium: 9.2 mg/dL (ref 8.4–10.5)
Chloride: 100 meq/L (ref 96–112)
Creatinine, Ser: 0.99 mg/dL (ref 0.50–1.35)
GFR calc Af Amer: >90 mL/min
GFR calc non Af Amer: >90 mL/min
Glucose, Bld: 129 mg/dL — ABNORMAL HIGH (ref 70–99)
Potassium: 4.7 meq/L (ref 3.7–5.3)
Sodium: 138 meq/L (ref 137–147)

## 2014-05-26 LAB — TROPONIN I
Troponin I: >20 ng/mL (ref <0.30)
Troponin I: >20 ng/mL (ref <0.30)
Troponin I: 20 ng/mL
Troponin I: 19.71 ng/mL (ref <0.30)

## 2014-05-26 LAB — CBC
HCT: 50.4 % (ref 39.0–52.0)
Hemoglobin: 17.6 g/dL — ABNORMAL HIGH (ref 13.0–17.0)
MCH: 32.2 pg (ref 26.0–34.0)
MCHC: 34.9 g/dL (ref 30.0–36.0)
MCV: 92.3 fL (ref 78.0–100.0)
Platelets: 255 10*3/uL (ref 150–400)
RBC: 5.46 MIL/uL (ref 4.22–5.81)
RDW: 13.3 % (ref 11.5–15.5)
WBC: 10.7 10*3/uL — ABNORMAL HIGH (ref 4.0–10.5)

## 2014-05-26 LAB — HEMOGLOBIN A1C
Hgb A1c MFr Bld: 5.4 % (ref <5.7)
Mean Plasma Glucose: 108 mg/dL (ref <117)

## 2014-05-26 MED ORDER — ROSUVASTATIN CALCIUM 20 MG PO TABS: 20.0000 mg | ORAL_TABLET | Freq: Every day | ORAL | Status: DC

## 2014-05-26 MED ORDER — ROSUVASTATIN CALCIUM 20 MG PO TABS
20.0000 mg | ORAL_TABLET | Freq: Every day | ORAL | Status: DC
  Administered 2014-05-26: 20 mg via ORAL
  Filled 2014-05-26 (×3): qty 1

## 2014-05-26 MED ORDER — CARVEDILOL 6.25 MG PO TABS
6.2500 mg | ORAL_TABLET | Freq: Two times a day (BID) | ORAL | Status: DC
  Administered 2014-05-26 – 2014-05-27 (×3): 6.25 mg via ORAL
  Filled 2014-05-26 (×5): qty 1

## 2014-05-26 MED FILL — Sodium Chloride IV Soln 0.9%: INTRAVENOUS | Qty: 50 | Status: AC

## 2014-05-26 NOTE — Plan of Care (Signed)
Problem: Consults Goal: MI Patient Education (See Patient Education module for education specifics.)  Outcome: Completed/Met Date Met:  05/26/14 Goal: Skin Care Protocol Initiated - if Braden Score 18 or less If consults are not indicated, leave blank or document N/A  Outcome: Completed/Met Date Met:  05/26/14 Goal: Tobacco Cessation referral if indicated Outcome: Not Applicable Date Met:  22/63/33 Goal: Nutrition Consult-if indicated Outcome: Completed/Met Date Met:  05/26/14 Goal: Diabetes Guidelines if Diabetic/Glucose > 140 If diabetic or lab glucose is > 140 mg/dl - Initiate Diabetes/Hyperglycemia Guidelines & Document Interventions  Outcome: Not Applicable Date Met:  54/56/25  Problem: Phase I Progression Outcomes Goal: Hemodynamically stable Outcome: Progressing Goal: Initial discharge plan identified Outcome: Progressing

## 2014-05-26 NOTE — Progress Notes (Signed)
CARDIAC REHAB PHASE I   PRE:  Rate/Rhythm: 77 SR  BP:  Supine:   Sitting: 137/82  Standing:    SaO2:   MODE:  Ambulation: 700 ft   POST:  Rate/Rhythm: 79 SR  BP:  Supine:   Sitting: 146/76  Standing:    SaO2:  1517-6160 Pt walked 700 ft with steady gait. No CP. Tolerated well. Education completed with pt and wife who voiced understanding. Gave brilinta booklet and stent card. Discussed ex ed, risk factors, diet and NTG use. Discussed not smoking cigars and gave handouts on smoking cessation. Discussed CRP 2 and pt gave permission to refer to [facility] program. Encouraged pt to not resume running until he goes back to see cardiologist.   Graylon Good, RN BSN  05/26/2014 9:40 AM

## 2014-05-26 NOTE — Progress Notes (Signed)
SUBJECTIVE:  Pt seen and examined. No acute events overnight on telemetry. He denies chest pain, dyspnea, palpitations, or lightheadedness.   OBJECTIVE:   Vitals:   Filed Vitals:   05/26/14 0000 05/26/14 0300 05/26/14 0616 05/26/14 0817  BP: 155/83  157/91 140/75  Pulse: 68 65 64 79  Temp:   98.7 F (37.1 C) 98.5 F (36.9 C)  TempSrc:   Oral Oral  Resp: 20 26 20 20   Height:      Weight:      SpO2: 98% 99% 97% 97%   I&O's:   Intake/Output Summary (Last 24 hours) at 05/26/14 0911 Last data filed at 05/26/14 0800  Gross per 24 hour  Intake 1274.58 ml  Output   3650 ml  Net -2375.42 ml   TELEMETRY: Reviewed telemetry pt in NSR and 1st degree heart block  PHYSICAL EXAM General: Well developed, well nourished, in no acute distress Head:   Normal cephalic and atramatic  Lungs:  Clear bilaterally to auscultation. Heart:  HRRR S1 S2  No JVD.   Abdomen: Abdomen soft and non-tender Msk:  Back normal,  Normal strength and tone for age. Right wrist with CDI dressing, Palpable radial pulse with positive Allen's test.  Extremities:  No edema.   Neuro: Alert and oriented. Psych:  Normal affect, responds appropriately Skin: No rash  LABS: Basic Metabolic Panel:  Recent Labs  05/25/14 1250 05/26/14 0536  NA 139 138  K 3.8 4.7  CL 101 100  CO2 27 25  GLUCOSE 98 129*  BUN 12 10  CREATININE 0.99 0.99  CALCIUM 8.7 9.2   Liver Function Tests:  Recent Labs  05/25/14 1250  AST 39*  ALT 25  ALKPHOS 75  BILITOT 0.3  PROT 6.7  ALBUMIN 3.8   No results for input(s): LIPASE, AMYLASE in the last 72 hours. CBC:  Recent Labs  05/25/14 1250 05/26/14 0536  WBC 10.2 10.7*  HGB 16.3 17.6*  HCT 46.0 50.4  MCV 91.1 92.3  PLT 241 255   Cardiac Enzymes:  Recent Labs  05/25/14 1826 05/26/14 0026 05/26/14 0536  TROPONINI >20.00* >20.00* >20.00*   BNP: Invalid input(s): POCBNP D-Dimer: No results for input(s): DDIMER in the last 72 hours. Hemoglobin A1C: No  results for input(s): HGBA1C in the last 72 hours. Fasting Lipid Panel:  Recent Labs  05/25/14 1250  CHOL 208*  HDL 46  LDLCALC 139*  TRIG 116  CHOLHDL 4.5   Thyroid Function Tests: No results for input(s): TSH, T4TOTAL, T3FREE, THYROIDAB in the last 72 hours.  Invalid input(s): FREET3 Anemia Panel: No results for input(s): VITAMINB12, FOLATE, FERRITIN, TIBC, IRON, RETICCTPCT in the last 72 hours. Coag Panel:   No results found for: INR, PROTIME  RADIOLOGY: Dg Chest 2 View  05/26/2014   CLINICAL DATA:  Acute MI.  Stent.  EXAM: CHEST  2 VIEW  COMPARISON:  None.  FINDINGS: Heart size is normal. The lungs are clear. No pulmonary edema. No focal consolidations or pleural effusions. There is mild compression deformity of mid vertebral body levels, likely T7, T8, and T9. These appear to be chronic.  IMPRESSION: 1.  No evidence for acute cardiopulmonary abnormality. 2. 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   Electronically Signed   By: Shon Hale M.D.   On: 05/26/2014 07:28   CARDIAC CATH:   Principal Problem:   ST elevation (STEMI) myocardial infarction involving left anterior descending coronary artery Active Problems:   Hyperlipidemia with target  LDL less than 70   Essential hypertension   Hypogonadism male   Presence of drug coated stent in pLAD -- Resolute Integrity 3 5 x 18 mm DES (3.75 mm)   CAD S/P percutaneous coronary angioplasty   Prediabetes  ASSESSMENT: Pt is a 53 yo M with HTN, HL, hypogonadism on testosterone thearpy, and gout found to have acute anterior STEMI with 45-50 minutes of ischemia s/p proximal LAD angioplasty with drug eluting stenting.   PLAN:   Acute Anterior STEMI s/p DES to LAD - To start cardiac rehab. Currently on aspirin 81 mg daily and brilinta 90 mg BID which he is to continue for at least 1 year s/p DES. Pt also on lipitor 80 mg daily, to change to crestor 20 mg daily after few days. Avoid nitrate therapy in setting of recent  viagra use. Pt counseled on limiting physical activity for next 4-6 weeks.   Reduced LV systolic function -  Reported to be 50% on catherization. 2D-echo today. Consider ACEi therapy if found to have reduced LV systolic dysfunction.  Hypertension - Currently hypertensive. Will add carvedilol 6.25 mg BID.  Hyperlipidemia - LDL 139 and total cholesterol 208. Continue lipitor 80 mg for few days then transition to crestor 20 mg daily. Pt reports intolerance to simvastatin in past. Consider PSK-9 as outpatient.  Pre-diabetes - Last A1c in August was 5.7. Pending A1c. Consider starting metformin as outpatient.   Hypogonadism - On IM testosterone replacement therapy, ok to continue. Last testosterone level in August was low (216). Management per PCP.   Pt stable to transfer out of CCU to telemetry bed.   Juluis Mire, MD  PGY-II IMTS Pager: [phone] 05/26/2014  9:11 AM  ATTENDING ATTESTATION:  I have seen and examined the patient along with Dr. Naaman Plummer on AM rounds.  I have reviewed the chart, notes and new data.  I agree with her's findings, examination & recommendations as noted above & discussed on rounds..  Brief Description: 52 y/o man with HTN/HLD - otherwise very active & exercises regularly pw/ Ant STEMI (as txfr from [facility]) --> PCI to Prox LAD. Preserved LVEF.  Sx began as Exertional Angina on Saturday 11/7 while working out. Sunday 11/8, pain returned during more strenuous exertion --> did not go away with rest --> [facility] ED.    Key new complaints: READY TO GET UP AND WALK AROUND  Key examination changes: stable exam, radial access site benign; normal Allen's test  Key new findings / data: echocardiogram pending, EKG with involving anterior STEMI changes  EKG with NSR rate 67, 1 PVC, evolving biphasic ST-T wave changes in anterior and lateral leads suggestive of evolving anterior MI. Inferior STdepression no longer present  troponins continue to greater than 20  PLAN:  Continue to  titrate cardiac medications. Changing from Lipitor to Crestor for better tolerance of statins. He had a prior previous intolerance to simvastatin.  On dual antiplatelet therapy with aspirin plus Brilinta per DES stent in LAD. With blood pressure being elevated, starting carvedilol.  We discussed testosterone replacement therapy, my recommendation would be targeting some therapeutic levels, with the knowledge that there is potential Ross reaction between hormone replacement and cardiac disease.  For follow-up, he can follow up with [facility] office with a mid-level provider then with Dr. Tamala Julian. After that he may consider switching to the [place] office.  Transferred to telemetry day anticipate discharge tomorrow if stable. Worked with cardiac rehabilitation today. He will need a work note for at least  3 weeks out of work, after that he should return to full desk work. However I recommend that he waits a full 6 weeks or going into full exercise routine.  Leonie Man, M.D., M.S.  [address] [place], [place]  [address]  [phone]  05/26/2014 12:12 PM

## 2014-05-26 NOTE — Care Management Note (Signed)
    Page 1 of 1   05/26/2014     10:00:57 AM CARE MANAGEMENT NOTE 05/26/2014  Patient:  Stephen [name], Stephen [name]   Account Number:  [id number]  Date Initiated:  05/26/2014  Documentation initiated by:  Elissa Hefty  Subjective/Objective Assessment:   adm w stemi     Action/Plan:   lives w wife, pcp dr Elsie Stain   Anticipated DC Date:     Anticipated DC Plan:  [place]  CM consult  Medication Assistance      Choice offered to / List presented to:             Status of service:   Medicare Important Message given?   (If response is "NO", the following Medicare IM given date fields will be blank) Date Medicare IM given:   Medicare IM given by:   Date Additional Medicare IM given:   Additional Medicare IM given by:    Discharge Disposition:    Per UR Regulation:  Reviewed for med. necessity/level of care/duration of stay  If discussed at [place] of Stay Meetings, dates discussed:    Comments:  11/9 0959 debbie Ziyan Schoon rn,bsn card rehab gave pt 30day free and brilinta copay assist card.

## 2014-05-27 ENCOUNTER — Encounter (HOSPITAL_COMMUNITY): Payer: Self-pay | Admitting: Cardiology

## 2014-05-27 DIAGNOSIS — I517 Cardiomegaly: Secondary | ICD-10-CM

## 2014-05-27 DIAGNOSIS — Z720 Tobacco use: Secondary | ICD-10-CM | POA: Diagnosis present

## 2014-05-27 HISTORY — DX: Tobacco use: Z72.0

## 2014-05-27 LAB — TROPONIN I
Troponin I: 14.77 ng/mL (ref <0.30)
Troponin I: 7.78 ng/mL (ref <0.30)

## 2014-05-27 MED ORDER — TESTOSTERONE CYPIONATE 200 MG/ML IM SOLN: 200.0000 mg | INTRAMUSCULAR | Status: DC

## 2014-05-27 MED ORDER — TICAGRELOR 90 MG PO TABS: 90.0000 mg | ORAL_TABLET | Freq: Two times a day (BID) | ORAL | Status: DC

## 2014-05-27 MED ORDER — SILDENAFIL CITRATE 100 MG PO TABS: 50.0000 mg | ORAL_TABLET | Freq: Every day | ORAL | Status: DC | PRN

## 2014-05-27 MED ORDER — MOMETASONE FURO-FORMOTEROL FUM 100-5 MCG/ACT IN AERO: 2.0000 | INHALATION_SPRAY | Freq: Two times a day (BID) | RESPIRATORY_TRACT | Status: DC

## 2014-05-27 MED ORDER — TICAGRELOR 90 MG PO TABS
90.0000 mg | ORAL_TABLET | Freq: Two times a day (BID) | ORAL | Status: DC
Start: 2014-05-27 — End: 2014-05-27

## 2014-05-27 MED ORDER — LISINOPRIL 5 MG PO TABS: 5.0000 mg | ORAL_TABLET | Freq: Every day | ORAL | Status: DC

## 2014-05-27 MED ORDER — ROSUVASTATIN CALCIUM 20 MG PO TABS
20.0000 mg | ORAL_TABLET | Freq: Every day | ORAL | Status: DC
Start: 2014-05-27 — End: 2014-08-18

## 2014-05-27 MED ORDER — CARVEDILOL 6.25 MG PO TABS: 6.2500 mg | ORAL_TABLET | Freq: Two times a day (BID) | ORAL | Status: DC

## 2014-05-27 MED ORDER — LISINOPRIL 5 MG PO TABS
5.0000 mg | ORAL_TABLET | Freq: Every day | ORAL | Status: DC
  Filled 2014-05-27: qty 1

## 2014-05-27 MED ORDER — NITROGLYCERIN 0.4 MG SL SUBL: 0.4000 mg | SUBLINGUAL_TABLET | SUBLINGUAL | Status: DC | PRN

## 2014-05-27 MED ORDER — ASPIRIN 81 MG PO CHEW: 81.0000 mg | CHEWABLE_TABLET | Freq: Every day | ORAL | Status: AC

## 2014-05-27 NOTE — Progress Notes (Signed)
05/27/2014 2:55 PM Iv's removed. Discharge instructions given and verbalized understanding by Patient and wife. Discharge to car via w/c. Stephen [name], Carolynn Comment

## 2014-05-27 NOTE — Discharge Summary (Signed)
Physician Discharge Summary       Patient ID: Stephen [name] MRN: [id number] DOB/AGE: 1961-02-15 53 y.o.  Admit date: 05/25/2014 Discharge date: 05/27/2014  Discharge Diagnoses:  Principal Problem:   ST elevation (STEMI) myocardial infarction involving left anterior descending coronary artery Active Problems:   Presence of drug coated stent in pLAD -- Resolute Integrity 3 5 x 18 mm DES (3.75 mm)   CAD S/P percutaneous coronary angioplasty   Hyperlipidemia with target LDL less than 70   Essential hypertension   Hypogonadism male   Prediabetes   Tobacco abuse, + cigars   Discharged Condition: good  Primary Cardiologist: Dr. Tamala Julian   Procedures: 05/25/14 emergent cardiac cath with Dr. Tamala Julian  05/25/14  Successful proximal LAD angioplasty and stenting with reduction of 99% proximal stenosis to less than 10% with TIMI grade III flow.  EF 50%   Hospital Course: 53 year old gentleman with no prior history of cardiovascular disease developed severe chest discomfort starting at 9:15 AM on 05/25/14. He had a similar but less severe discomfort while running a 5K 24 hours earlier. In the [facility] Emergency Department, EKG demonstrated anterior ST elevation V1 through V5. Nitroglycerin paste was started. He was given aspirin and given 4000 units of IV heparin. He was then transported to the [facility] in the emergency room where by the time of arrival he was pain-free. The discomfort resolved during transport. He noted that the use of Cialis within the past 24 hours. He has been recently treated for low testosterone. He has erectile dysfunction. He has seasonal allergies. He has had side effects including cognitive impairment and neuropathic symptoms on statin therapy. He does admit to cigar smoking  Pt taken emergently to cath lab due to EKG withSinus rhythm with ST elevation V2 through V5 compatible with acute anterior infarction.  CAth revealed 99%  stenosis in LAD undergoing PTCA and stent.  EF 50%.  (see full report below)   Acute Anterior STEMI s/p DES to LAD - No further CP with troponins downtrending (14.77 from peak >20) and no events on telemetry. Continue Phase II cardiac rehab. On discharge continue aspirin 81 mg daily and brilanta 90 mg BID which he is to continue for at least 1 year s/p DES. Pt with occasional gasping in setting of brilanta use, if persists more than 1 month consider changing to effient. Continue crestor 20 mg daily for hyperlipidemia. Caution with nitrate therapy in setting of viagra use. Pt counseled on limiting streneuos physical activity for next 6 weeks. Pt to follow-up with his cardiologist Dr. Tamala Julian. Pt needs work excusal note in addition to spouse who will miss work for first initial days. Stable for discharge home after has 2D-echo.  Reduced LV systolic function - Reported to be 50% on cardiac catherization. 2D-echo today.  ACEi therapy  Added to have reduced LV systolic function. Continue newly started carvedilol 6.25 mg BID, consider uptitrating as outpatient.  Hypertension - Currently normotensive. Continue newly started carvedilol 6.25 mg BID, consider uptitrating as outpatient. Also consider ACEi therapy as outpatient if found to have reduced LV systolic function.  Hyperlipidemia - LDL 139 with goal <70. Total cholesterol 208. Continue crestor 20 mg daily on discharge. Pt reports intolerance to simvastatin in past. Continue to monitor for AE as outpatient.  History of Pre-diabetes - Last A1c in August was 5.7, improved to 5.4. Continue lifestyle modification.  Hypogonadism - On IM testosterone replacement therapy bimonthly. Last testosterone level in August was low (  216). Management per PCP with recommendations to continue with target therapeutic goal due to potential increased risk of CVD with use. Have asked not to use until cleared by Dr. Tamala Julian.  No Viagra for 3 weeks. 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   Was seen on 05/27/14 by Dr. Ellyn Hack and found stable for discharge.  He will follow up with Dr. Tamala Julian.   Echo stable at discharge per Dr. Ellyn Hack, currently not in computer.    Consults: None  Significant Diagnostic Studies:  BMET    Component Value Date/Time   NA 138 05/26/2014 0536   NA 140 02/21/2014 1150   K 4.7 05/26/2014 0536   CL 100 05/26/2014 0536   CO2 25 05/26/2014 0536   GLUCOSE 129* 05/26/2014 0536   GLUCOSE 82 02/21/2014 1150   BUN 10 05/26/2014 0536   BUN 14 02/21/2014 1150   CREATININE 0.99 05/26/2014 0536   CALCIUM 9.2 05/26/2014 0536   GFRNONAA >90 05/26/2014 0536   GFRAA >90 05/26/2014 0536    CBC    Component Value Date/Time   WBC 10.7* 05/26/2014 0536   WBC 5.6 03/06/2014 0801   RBC 5.46 05/26/2014 0536   RBC 5.38 03/06/2014 0801   HGB 17.6* 05/26/2014 0536   HCT 50.4 05/26/2014 0536   PLT 255 05/26/2014 0536   MCV 92.3 05/26/2014 0536   MCH 32.2 05/26/2014 0536   MCH 31.2 03/06/2014 0801   MCHC 34.9 05/26/2014 0536   MCHC 34.8 03/06/2014 0801   RDW 13.3 05/26/2014 0536   RDW 14.1 03/06/2014 0801   LYMPHSABS 1.6 03/06/2014 0801   EOSABS 0.4 03/06/2014 0801   BASOSABS 0.0 03/06/2014 0801   Troponin I  >20 pk at discharge 14.77  Lipid Panel     Component Value Date/Time   CHOL 208* 05/25/2014 1250   TRIG 116 05/25/2014 1250   HDL 46 05/25/2014 1250   HDL 42 02/21/2014 1150   CHOLHDL 4.5 05/25/2014 1250   CHOLHDL 5.7* 02/21/2014 1150   VLDL 23 05/25/2014 1250   LDLCALC 139* 05/25/2014 1250   LDLCALC 158* 02/21/2014 1150   CXR  CHEST 2 VIEW COMPARISON: None. FINDINGS: Heart size is normal. The lungs are clear. No pulmonary edema. No focal consolidations or pleural effusions. There is mild compression deformity of mid vertebral body levels, likely T7, T8, and T9. These appear to be chronic. IMPRESSION: 1. No  evidence for acute cardiopulmonary abnormality. 2. 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  EKG: prior to discharge  Sinus rhythm with occasional Premature ventricular complexes Rightward axis ST T wave changes consistent with injury Changes evolving from previous ECG   ECHO:  Typed copy Pending at discharge, but stable per Dr. Ellyn Hack.  Cardiac cath: HEMODYNAMICS: Aortic pressure 153/92 mmHg; LV pressure 155 /11 mmHg; LVEDP 18 mmHg  ANGIOGRAPHIC DATA: The left main coronary artery is normal.  The left anterior descending artery is occluded by a 99% thrombotic eccentric proximal stenosis. Total occlusion apical LAD.Marland Kitchen  The left circumflex artery is small and widely patent.  The ramus intermedius is large and trifurcates on the lateral wall.  The right coronary artery is dominant. It contains eccentric proximal 40-50% narrowing. The PDA is large and reaches the left ventricular apex. One large bifurcating left ventricular branch arises distally and is widely patent.  LEFT VENTRICULOGRAM: Left ventricular angiogram was done in the 30 RAO projection and  revealed mild distal anteroapical hypokinesis. EF 50%.  PCI RESULTS: Proximal LAD is 99% obstructed with evidence of thrombus. There was distal embolization of the distal LAD with total occlusion at the apex. After angioplasty and stenting the proximal lesion revealed less than 10% obstruction. There was a focal eccentric 90% apical LAD lesion due to thrombus. TIMI grade 3 flow was noted throughout the LAD territory. Final stent diameter of 4.0 mm.  IMPRESSIONS: 1. Anterior ST elevation myocardial infarction with 45-50 minutes of ischemic time. The patient had spontaneous recanalization in the first angiographic images revealed a severely thrombotic proximal LAD 99% stenosis with TIMI grade 3 flow and apical occlusion due to distal embolus.  2. Successful proximal LAD angioplasty and stenting with reduction of 99%  proximal stenosis to less than 10% with TIMI grade III flow.  3. Widely patent circumflex and ramus intermedius. The proximal dominant RCA contains eccentric 40-50% narrowing.  4. Mild anteroapical hypokinesis with EF of 50% by ventriculography   Discharge Exam: Blood pressure 139/86, pulse 71, temperature 98.5 F (36.9 C), temperature source Oral, resp. rate 18, height 5\' 7"  (1.702 m), weight 207 lb 3.7 oz (94 kg), SpO2 98 %.  Disposition: home       Discharge Instructions    Amb Referral to Cardiac Rehabilitation    Complete by:  As directed   Referring to [facility] 2            Medication List    STOP taking these medications        ADVAIR DISKUS 100-50 MCG/DOSE Aepb  Generic drug:  Fluticasone-Salmeterol  Replaced by:  mometasone-formoterol 100-5 MCG/ACT Aero     atenolol 25 MG tablet  Commonly known as:  TENORMIN     BC HEADACHE POWDER PO     CIALIS 10 MG tablet  Generic drug:  tadalafil     Fluticasone-Salmeterol 100-50 MCG/DOSE Aepb  Commonly known as:  ADVAIR      TAKE these medications        acetaminophen 500 MG tablet  Commonly known as:  TYLENOL  Take 500-1,000 mg by mouth daily as needed (pain).     ALAWAY OP  Place 1 drop into both eyes daily as needed (seasonal allergies).     aspirin 81 MG chewable tablet  Chew 1 tablet (81 mg total) by mouth daily.     carvedilol 6.25 MG tablet  Commonly known as:  COREG  Take 1 tablet (6.25 mg total) by mouth 2 (two) times daily with a meal.     COLCRYS 0.6 MG tablet  Generic drug:  colchicine  Take 0.6 mg by mouth daily as needed (gout symptoms).     EYE ALLERGY RELIEF OP  Apply to eye as needed.     fluticasone 50 MCG/ACT nasal spray  Commonly known as:  FLONASE  Place 1 spray into both nostrils daily as needed for allergies.     lisinopril 5 MG tablet  Commonly known as:  PRINIVIL,ZESTRIL  Take 1 tablet (5 mg total) by mouth daily.     mometasone-formoterol 100-5 MCG/ACT Aero    Commonly known as:  DULERA  Inhale 2 puffs into the lungs 2 (two) times daily.     rosuvastatin 20 MG tablet  Commonly known as:  CRESTOR  Take 1 tablet (20 mg total) by mouth daily at 6 PM.     sildenafil 100 MG tablet  Commonly known as:  VIAGRA  Take 50 mg by mouth daily as needed for  erectile dysfunction.     Syringe (Disposable) 3 ML Misc  Use every 28 days with 22g 1 inch needle.  Use for testosterone injection.  Disp 25 syringes and 25 needles     testosterone cypionate 200 MG/ML injection  Commonly known as:  DEPOTESTOTERONE CYPIONATE  Inject 1 mL (200 mg total) into the muscle every 21 ( twenty-one) days.     ticagrelor 90 MG Tabs tablet  Commonly known as:  BRILINTA  Take 1 tablet (90 mg total) by mouth 2 (two) times daily.     ZINC PO  Take 1 tablet by mouth daily.          Discharge Instructions: Call [facility] [phone] if any bleeding, swelling or drainage at cath site.  May shower, no tub baths for 48 hours for groin sticks.  No driving for 5 days, no lifting over 5 pounds for 5 days, no running for now.  Walk as per cardiac rehab.  Take 1 NTG, under your tongue, while sitting.  If no relief of pain may repeat NTG, one tab every 5 minutes up to 3 tablets total over 15 minutes.  If no relief CALL 911.  If you have dizziness/lightheadness  while taking NTG, stop taking and call 911.      Do not take if you have had viagra in 48 hours.   Heart Healthy diet. No Viagra for 3 weeks,  Hold testosterone until cleared by Dr. Tamala Julian.  No work for 3 weeks.    If the breathless feeling continues or increases call the office  [phone]    Signed: [facility]: [facility] 05/27/2014, 10:30 AM  Time spent on discharge : > 35 minutes.    I saw as an additional day of discharge. Please see my last note for full details. He was admitted for anterior and the with partial reperfusion   Prior to catheterization. He had normal EF by LV gram, as any further anginal pains. He is hemodynamically stable and ready for discharge as per Dr. Thompson Caul original assessment.  I personally review the echocardiogram, the report is not formally up, however it does appear that the ejection fraction is well-preserved with normal wall motion.   Leonie Man, M.D., M.S. Interventional Cardiologist   Pager # [phone]

## 2014-05-27 NOTE — Progress Notes (Signed)
[id number] Cardiac Rehab Noted that pt is for discharge. Reviewed discharge education with pt and wife. I answered their questions. He voices understanding. Pt knows that [facility] will contact him. Deon Pilling, RN 05/27/2014 10:51 AM

## 2014-05-27 NOTE — Progress Notes (Signed)
  Echocardiogram 2D Echocardiogram has been performed.  Aleah Ahlgrim FRANCES 05/27/2014, 12:27 PM

## 2014-05-27 NOTE — Discharge Instructions (Signed)
Call [facility] [phone] if any bleeding, swelling or drainage at cath site.  May shower, no tub baths for 48 hours for groin sticks.  No driving for 4 days, no lifting over 5 pounds for 5 days, no running for now.  Walk as per cardiac rehab.  Take 1 NTG, under your tongue, while sitting.  If no relief of pain may repeat NTG, one tab every 5 minutes up to 3 tablets total over 15 minutes.  If no relief CALL 911.  If you have dizziness/lightheadness  while taking NTG, stop taking and call 911.      Do not take if you have had viagra in 48 hours.   Heart Healthy diet.   No Viagra for 3 weeks,  Hold testosterone until cleared by Dr. Tamala Julian.  No work for 3 weeks.    If the breathless feeling continues or increases call the office  [phone]

## 2014-05-27 NOTE — Progress Notes (Signed)
SUBJECTIVE:  Pt seen and examined. No acute events overnight on telemetry. He reports having occasional gasping but denies chest pain, palpitations, LE edema, or lightheadedness.   OBJECTIVE:   Vitals:   Filed Vitals:   05/26/14 2000 05/27/14 0600 05/27/14 0741 05/27/14 0744  BP: 154/91 146/92  139/86  Pulse: 71     Temp: 98.1 F (36.7 C) 98.2 F (36.8 C) 98.5 F (36.9 C)   TempSrc: Oral Oral Oral   Resp: 16   18  Height:      Weight:      SpO2: 96% 98%  98%   I&O's:    Intake/Output Summary (Last 24 hours) at 05/27/14 0844 Last data filed at 05/27/14 0800  Gross per 24 hour  Intake    600 ml  Output   1675 ml  Net  -1075 ml   TELEMETRY: Reviewed telemetry pt in NSR and 1st degree heart block  PHYSICAL EXAM General: Well developed, well nourished, in no acute distress Head:   Normal cephalic and atramatic  Lungs:  Clear bilaterally to auscultation. Heart:  HRRR S1 S2  No JVD.   Abdomen: Abdomen soft and non-tender Msk:  Back normal,  Normal strength and tone for age. Right wrist with CDI dressing and palpable radial pulse.  Extremities:  No edema.   Neuro: Alert and oriented. Psych:  Normal affect, responds appropriately Skin: No rash  LABS: Basic Metabolic Panel:  Recent Labs  05/25/14 1250 05/26/14 0536  NA 139 138  K 3.8 4.7  CL 101 100  CO2 27 25  GLUCOSE 98 129*  BUN 12 10  CREATININE 0.99 0.99  CALCIUM 8.7 9.2   Liver Function Tests:  Recent Labs  05/25/14 1250  AST 39*  ALT 25  ALKPHOS 75  BILITOT 0.3  PROT 6.7  ALBUMIN 3.8   No results for input(s): LIPASE, AMYLASE in the last 72 hours. CBC:  Recent Labs  05/25/14 1250 05/26/14 0536  WBC 10.2 10.7*  HGB 16.3 17.6*  HCT 46.0 50.4  MCV 91.1 92.3  PLT 241 255   Cardiac Enzymes:  Recent Labs  05/26/14 1110 05/26/14 1922 05/27/14 0131  TROPONINI 20.00* 19.71* 14.77*   BNP: Invalid input(s): POCBNP D-Dimer: No results for input(s): DDIMER in the last 72  hours. Hemoglobin A1C:  Recent Labs  05/26/14 0536  HGBA1C 5.4   Fasting Lipid Panel:  Recent Labs  05/25/14 1250  CHOL 208*  HDL 46  LDLCALC 139*  TRIG 116  CHOLHDL 4.5   Thyroid Function Tests: No results for input(s): TSH, T4TOTAL, T3FREE, THYROIDAB in the last 72 hours.  Invalid input(s): FREET3 Anemia Panel: No results for input(s): VITAMINB12, FOLATE, FERRITIN, TIBC, IRON, RETICCTPCT in the last 72 hours. Coag Panel:   No results found for: INR, PROTIME  RADIOLOGY: Dg Chest 2 View  05/26/2014   CLINICAL DATA:  Acute MI.  Stent.  EXAM: CHEST  2 VIEW  COMPARISON:  None.  FINDINGS: Heart size is normal. The lungs are clear. No pulmonary edema. No focal consolidations or pleural effusions. There is mild compression deformity of mid vertebral body levels, likely T7, T8, and T9. These appear to be chronic.  IMPRESSION: 1.  No evidence for acute cardiopulmonary abnormality. 2. 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   Electronically Signed   By: Shon Hale M.D.   On: 05/26/2014 07:28   CARDIAC CATH:   Principal Problem:   ST elevation (STEMI) myocardial infarction involving  left anterior descending coronary artery Active Problems:   Hyperlipidemia with target LDL less than 70   Essential hypertension   Hypogonadism male   Presence of drug coated stent in pLAD -- Resolute Integrity 3 5 x 18 mm DES (3.75 mm)   CAD S/P percutaneous coronary angioplasty   Prediabetes  ASSESSMENT: Pt is a 53 yo M with HTN, HL, hypogonadism on testosterone thearpy, and gout found to have acute anterior STEMI with 45-50 minutes of ischemia s/p proximal LAD angioplasty with drug eluting stenting.   PLAN:   Acute Anterior STEMI s/p DES to LAD - No further CP with troponins downtrending (14.77 from peak >20) and no events on telemetry. Continue Phase II cardiac rehab. On discharge continue aspirin 81 mg daily and brilanta 90 mg BID which he is to continue for at least 1 year  s/p DES. Pt with occasional gasping in setting of brilanta use, if persists more than 1 month consider changing to effient. Continue crestor 20 mg daily for hyperlipidemia. Caution with nitrate therapy in setting of viagra use. Pt counseled on limiting streneuos physical activity for next 6 weeks. Pt to follow-up with his cardiologist Dr. Tamala Julian. Pt needs work excusal note in addition to spouse who will miss work for first initial days. Stable for discharge home after has 2D-echo.   Reduced LV systolic function -  Reported to be 50% on cardiac catherization. 2D-echo today. Consider ACEi therapy as outpatient if found to have reduced LV systolic function. Continue newly started carvedilol 6.25 mg BID, consider uptitrating as outpatient.  Hypertension - Currently normotensive. Continue newly started carvedilol 6.25 mg BID, consider uptitrating as outpatient. Also consider ACEi therapy as outpatient if found to have reduced LV systolic function.  Hyperlipidemia - LDL 139 with goal <70. Total cholesterol 208. Continue crestor 20 mg daily on discharge. Pt reports intolerance to simvastatin in past. Continue to monitor for AE as outpatient.  History of Pre-diabetes - Last A1c in August was 5.7, improved to 5.4. Continue lifestyle modification.   Hypogonadism - On IM testosterone replacement therapy bimonthly. Last testosterone level in August was low (216). Management per PCP with recommendations to continue with target therapeutic goal due to potential increased risk of CVD with use.  Non-tophaceous Gout -  Last uric acid level 7.2 on chronic colchicine therapy, consider changing to allopurinol as outpatient with goal uric acid <6.    Pt stable for discharge home from CCU today. Attending note to follow.  Stephen Mire, MD  PGY-II IMTS Pager: [phone] 05/27/2014  8:44 AM  LOS: 2  ATTENDING ATTESTATION:  I have seen and examined the patient along with Dr. Naaman Plummer on morning rounds.  I have reviewed  the chart, notes and new data.  I agree with her findings, examination & recommendations as noted above.    Brief Description: 53 year old gentleman now 2 days status post anterior STEMI with LAD stent. Echocardiogram pending currently. He remains hemodynamically stable and chest pain-free. He does note some mild gasping breathing symptom which is consistent with Brilinta side effect. He is tolerating stable dose of carvedilol as well as Crestor. Mildly reduced EF by cardiac cath. Echo pending today. Depending on his blood pressure would consider ARB or ACE inhibitor on discharge.  PLAN:  He has been stable and living in the halls today. Plan is discharge today.    He still has echocardiogram pending and will be discharged following that.  Agree with comments on testosterone replacement therapy - would increasedosage interval versus  reduce the dose itself tissue for a lower therapeutic range.    Leonie Man, M.D., M.S.  [address] [place], [place]  [address]  [phone]  05/27/2014 12:21 PM

## 2014-05-28 LAB — POCT I-STAT, CHEM 8
BUN: 12 mg/dL (ref 6–23)
Calcium, Ion: 1.2 mmol/L (ref 1.12–1.23)
Chloride: 103 mEq/L (ref 96–112)
Creatinine, Ser: 0.9 mg/dL (ref 0.50–1.35)
Glucose, Bld: 116 mg/dL — ABNORMAL HIGH (ref 70–99)
HCT: 51 % (ref 39.0–52.0)
Hemoglobin: 17.3 g/dL — ABNORMAL HIGH (ref 13.0–17.0)
Potassium: 4.1 mEq/L (ref 3.7–5.3)
Sodium: 140 mEq/L (ref 137–147)
TCO2: 24 mmol/L (ref 0–100)

## 2014-05-28 LAB — POCT ACTIVATED CLOTTING TIME: Activated Clotting Time: 332 seconds

## 2014-05-30 ENCOUNTER — Ambulatory Visit (INDEPENDENT_AMBULATORY_CARE_PROVIDER_SITE_OTHER): Payer: Managed Care, Other (non HMO) | Admitting: Interventional Cardiology

## 2014-05-30 ENCOUNTER — Encounter: Payer: Self-pay | Admitting: Interventional Cardiology

## 2014-05-30 VITALS — BP 122/78 | HR 52 | Ht 67.0 in | Wt 207.0 lb

## 2014-05-30 DIAGNOSIS — I2510 Atherosclerotic heart disease of native coronary artery without angina pectoris: Secondary | ICD-10-CM

## 2014-05-30 DIAGNOSIS — Z720 Tobacco use: Secondary | ICD-10-CM

## 2014-05-30 DIAGNOSIS — E785 Hyperlipidemia, unspecified: Secondary | ICD-10-CM

## 2014-05-30 DIAGNOSIS — Z9861 Coronary angioplasty status: Secondary | ICD-10-CM

## 2014-05-30 DIAGNOSIS — I10 Essential (primary) hypertension: Secondary | ICD-10-CM

## 2014-05-30 DIAGNOSIS — I2102 ST elevation (STEMI) myocardial infarction involving left anterior descending coronary artery: Secondary | ICD-10-CM

## 2014-05-30 NOTE — Progress Notes (Signed)
Patient ID: Stephen [name], male   DOB: 1960-08-14, 53 y.o.   MRN: [id number]    [address] [place], [place]  [address] Phone: [phone] Fax:  [phone]  Date:  05/30/2014   ID:  Stephen [name], DOB [birth year], MRN [id number]  PCP:  Stephen Stain, MD   ASSESSMENT:  1. Anterior ST elevation myocardial infarction on 05/25/2014, treated with drug-eluting stent to the proximal LAD without any recurrence of symptoms 2. Initial reduction in LV function acutely with recovery of LVEF to 60% by time of discharge 48 hours later 3. Hyperlipidemia, currently on statin therapy 4. History of statin intolerance with cognitive and musculoskeletal Complications 5. Hypertension, essential   PLAN:  1. If he develops musculoskeletal or other complaints on rosuvastatin, we will switch the patient to a PSK-9 agent 2. Clinical follow-up in 6 weeks 3. Enroll in cardiac rehabilitation 4. Anticipate return to work 06/15/2014 5. On return office visit will check a repeat lipid panel with ALT 6. Reassurance concerning feelings of sadness and anxiety that he is having. If she continues to have difficulty he may require antidepressant therapy for what would be expected To be short-term situational.    SUBJECTIVE: Stephen [name] is a 53 y.o. male who suffered an anterior ST elevation myocardial infarction while exercising on treadmill. He had 30-40 minutes of severe ischemic pain associated with ST segment elevation. Upon arrival at the emergency department via EMS, he was pain-free and at first angiogram had reperfused the proximal LAD which contained a 99% thrombus containing obstruction and there was also evidence of apical LAD embolization. He denies orthopnea, PND, exertional dyspnea, syncope, and other complaints. No palpitations. No recurrence of angina. No side effects to the current medical regimen.  He is clearly emotionally distraught concerning this occurrence. He  states on one hand he is related that he is still living. On the other hand he is saddened and frightened by the fact that he had a heart attack when he was trying to lead a healthy lifestyle. He is also concerned that he just started a new management position with [employer] and is concerned about his future.   Wt Readings from Last 3 Encounters:  05/30/14 207 lb (93.895 kg)  05/25/14 207 lb 3.7 oz (94 kg)  02/21/14 204 lb 8 oz (92.761 kg)     Past Medical History  Diagnosis Date  . GERD (gastroesophageal reflux disease)   . Gout   . Hypertension   . Elevated glucose   . Seasonal allergies     uses advair in May  . ED (erectile dysfunction)   . Hyperlipidemia   . Achilles tendon injury     right  . Morton's neuroma     2014  . Tobacco abuse, + cigars 05/27/2014  . CAD S/P percutaneous coronary angioplasty 05/25/2014  . ST elevation myocardial infarction (STEMI) of anterior wall 05/25/14    Current Outpatient Prescriptions  Medication Sig Dispense Refill  . aspirin 81 MG chewable tablet Chew 1 tablet (81 mg total) by mouth daily.    . carvedilol (COREG) 6.25 MG tablet Take 1 tablet (6.25 mg total) by mouth 2 (two) times daily with a meal. 60 tablet 6  . colchicine (COLCRYS) 0.6 MG tablet Take 0.6 mg by mouth daily as needed (gout symptoms).    Marland Kitchen lisinopril (PRINIVIL,ZESTRIL) 5 MG tablet Take 1 tablet (5 mg total) by mouth daily. 30 tablet 6  . Multiple Vitamins-Minerals (ZINC PO) Take 1 tablet by mouth  daily.    . rosuvastatin (CRESTOR) 20 MG tablet Take 1 tablet (20 mg total) by mouth daily at 6 PM. 30 tablet 6  . Syringe, Disposable, 3 ML MISC Use every 28 days with 22g 1 inch needle.  Use for testosterone injection.  Disp 25 syringes and 25 needles 25 each 2  . [START ON 06/03/2014] testosterone cypionate (DEPOTESTOTERONE CYPIONATE) 200 MG/ML injection Inject 1 mL (200 mg total) into the muscle every 21 ( twenty-one) days. 10 mL 0  . ticagrelor (BRILINTA) 90 MG TABS tablet Take 1  tablet (90 mg total) by mouth 2 (two) times daily. 60 tablet 11  . acetaminophen (TYLENOL) 500 MG tablet Take 500-1,000 mg by mouth daily as needed (pain).    . ADVAIR DISKUS 100-50 MCG/DOSE AEPB   0  . fluticasone (FLONASE) 50 MCG/ACT nasal spray Place 1 spray into both nostrils daily as needed for allergies.     . Ketotifen Fumarate (ALAWAY OP) Place 1 drop into both eyes daily as needed (seasonal allergies).    . mometasone-formoterol (DULERA) 100-5 MCG/ACT AERO Inhale 2 puffs into the lungs 2 (two) times daily. 1 Inhaler 1  . Naphazoline-Pheniramine (EYE ALLERGY RELIEF OP) Apply to eye as needed.      . nitroGLYCERIN (NITROSTAT) 0.4 MG SL tablet Place 1 tablet (0.4 mg total) under the tongue every 5 (five) minutes as needed for chest pain. 25 tablet 3  . [START ON 06/17/2014] sildenafil (VIAGRA) 100 MG tablet Take 0.5 tablets (50 mg total) by mouth daily as needed for erectile dysfunction. 10 tablet 0   No current facility-administered medications for this visit.    Allergies:    Allergies  Allergen Reactions  . Claritin-D 12 Hour [Loratadine-Pseudoephedrine Er] Other (See Comments)    shaky  . Kiwi Extract Other (See Comments)    Throat irritation, swelling  . Penicillins Other (See Comments)    Unknown childhood allergic reaction  . Simvastatin Other (See Comments)    Numbness in toes, raised A1C, affected memory, fatigue    Social History:  The patient  reports that he quit smoking 5 days ago. His smoking use included Cigars. He has never used smokeless tobacco. He reports that he drinks about 1.2 oz of alcohol per week. He reports that he does not use illicit drugs.   ROS:  Please see the history of present illness.   Denies neurological complaints. No orthopnea. Denies peripheral edema. No local right radial cath site complications. He denies abdominal pain. No edema.   All other systems reviewed and negative.   OBJECTIVE: VS:  BP 122/78 mmHg  Pulse 52  Ht 5\' 7"  (1.702 m)   Wt 207 lb (93.895 kg)  BMI 32.41 kg/m2 Well nourished, well developed, in no acute distress, obese HEENT: normal Neck: JVD flat. Carotid bruit absent  Cardiac:  normal S1, S2; RRR; no murmur Lungs:  clear to auscultation bilaterally, no wheezing, rhonchi or rales Abd: soft, nontender, no hepatomegaly Ext: Edema absent. Pulses 2+ and symmetric Skin: warm and dry Neuro:  CNs 2-12 intact, no focal abnormalities noted  EKG:  Not repeated       Signed, Illene Labrador III, MD 05/30/2014 12:34 PM

## 2014-05-30 NOTE — Patient Instructions (Signed)
Your physician recommends that you continue on your current medications as directed. Please refer to the Current Medication list given to you today.  Your physician recommends that you schedule a follow-up appointment in: 6 weeks

## 2014-06-02 ENCOUNTER — Telehealth: Payer: Self-pay | Admitting: Interventional Cardiology

## 2014-06-02 NOTE — Telephone Encounter (Signed)
New Message:  Pt calling in stating that he was admitted to [facility] on 11/8 and some blood work was done but he has yet to receive any results. Please call  Thanks

## 2014-06-02 NOTE — Telephone Encounter (Signed)
pt adv that we cannot release lab results that were drawn while pt was hospitalized.pt will need to call [facility] medical rcords dept. provided Tel # for [facility] to pt.pt verbalized understanding.

## 2014-06-02 NOTE — Telephone Encounter (Signed)
New Message:  Arbie Cookey was calling in stating that she will be faxing over an order for Cardiac Rehab and would like for the Doctor to sign it and send back over. Please call  Thanks

## 2014-06-03 ENCOUNTER — Telehealth: Payer: Self-pay | Admitting: Interventional Cardiology

## 2014-06-03 ENCOUNTER — Encounter: Payer: Self-pay | Admitting: Interventional Cardiology

## 2014-06-03 NOTE — Telephone Encounter (Signed)
Pt adv that pt testosterone injections have not been d/c by Dr.Smith. Dr.Smith recommend that Dr.Eskridge treat his low testosterone conservatively. Keeping his levels on the lower end of normal.pt adv that info has been fwd to Carrick 's office.pt verbalized understanding

## 2014-06-03 NOTE — Telephone Encounter (Signed)
Signed  [facility] cardiac rehab ref form faxed

## 2014-06-03 NOTE — Telephone Encounter (Signed)
New message           Pt has questions about medication

## 2014-06-23 ENCOUNTER — Other Ambulatory Visit: Payer: Self-pay | Admitting: *Deleted

## 2014-06-23 ENCOUNTER — Telehealth: Payer: Self-pay | Admitting: Interventional Cardiology

## 2014-06-23 MED ORDER — TICAGRELOR 90 MG PO TABS: 90.0000 mg | ORAL_TABLET | Freq: Two times a day (BID) | ORAL | Status: DC

## 2014-06-23 NOTE — Telephone Encounter (Signed)
Returned pt call. Pt rqst an Rx for 90 day supply of Brilinta.done.pt adv that his disability paperwork has been received back form [employer] it is awaiting Dr.Smith's signature. Adv him that medical records will notify him when paperwork is completed. Pt verbalized understanding

## 2014-06-23 NOTE — Telephone Encounter (Signed)
New problem   Pt need to speak to nurse concerning his medications and a form. Please call pt.

## 2014-06-26 ENCOUNTER — Encounter (HOSPITAL_COMMUNITY): Payer: Self-pay | Admitting: Interventional Cardiology

## 2014-06-30 NOTE — Telephone Encounter (Signed)
returned pt call.ppt sts that his employer got the incorrect FMLA paperwork. There was 2 be 2 FMLA completed one for him as the pt to give to his employer and one to be completed for his wife employer, and  Adv him that all paperwork received form [employer] has been signed by Dr.Smith and given to our office medical records. Adv him I will f/u with Maudie Mercury in medical records and call back with an update

## 2014-06-30 NOTE — Telephone Encounter (Signed)
Follow Up      Pt is following up on forms and would like to speak to nurse to clarify some details. Please call.

## 2014-07-01 ENCOUNTER — Telehealth: Payer: Self-pay | Admitting: Interventional Cardiology

## 2014-07-01 NOTE — Telephone Encounter (Signed)
I have Called and Left Vm With Pt's Disability Representative With [employer] @ # [phone] he called asking For Dates of Service,Codes,Surgeries,OV notes, a Signed ROI Will be Needed 1st Before Records can Be Released. Fax # For Herbie Baltimore at [place] is [phone] with Claim # Of [id number]

## 2014-07-01 NOTE — Telephone Encounter (Signed)
Routed to [facility] in medical records to address

## 2014-07-01 NOTE — Telephone Encounter (Signed)
New message      Need to verify info regarding his hosp stay for disability claim

## 2014-07-28 ENCOUNTER — Encounter: Payer: Self-pay | Admitting: Interventional Cardiology

## 2014-07-28 ENCOUNTER — Ambulatory Visit (INDEPENDENT_AMBULATORY_CARE_PROVIDER_SITE_OTHER): Payer: Managed Care, Other (non HMO) | Admitting: Interventional Cardiology

## 2014-07-28 VITALS — BP 144/92 | HR 59 | Ht 67.0 in | Wt 206.0 lb

## 2014-07-28 DIAGNOSIS — Z720 Tobacco use: Secondary | ICD-10-CM

## 2014-07-28 DIAGNOSIS — I2510 Atherosclerotic heart disease of native coronary artery without angina pectoris: Secondary | ICD-10-CM

## 2014-07-28 DIAGNOSIS — E785 Hyperlipidemia, unspecified: Secondary | ICD-10-CM

## 2014-07-28 DIAGNOSIS — I10 Essential (primary) hypertension: Secondary | ICD-10-CM

## 2014-07-28 LAB — LIPID PANEL
Cholesterol: 120 mg/dL (ref 0–200)
HDL: 37 mg/dL — ABNORMAL LOW (ref >39.00)
LDL Cholesterol: 59 mg/dL (ref 0–99)
NonHDL: 83
Total CHOL/HDL Ratio: 3
Triglycerides: 121 mg/dL (ref 0.0–149.0)
VLDL: 24.2 mg/dL (ref 0.0–40.0)

## 2014-07-28 LAB — HEPATIC FUNCTION PANEL
ALT: 28 U/L (ref 0–53)
AST: 19 U/L (ref 0–37)
Albumin: 4.3 g/dL (ref 3.5–5.2)
Alkaline Phosphatase: 78 U/L (ref 39–117)
Bilirubin, Direct: 0 mg/dL (ref 0.0–0.3)
Total Bilirubin: 0.7 mg/dL (ref 0.2–1.2)
Total Protein: 7.7 g/dL (ref 6.0–8.3)

## 2014-07-28 MED ORDER — VALSARTAN 80 MG PO TABS: 80.0000 mg | ORAL_TABLET | Freq: Every day | ORAL | Status: DC

## 2014-07-28 MED ORDER — CLOPIDOGREL BISULFATE 75 MG PO TABS: 75.0000 mg | ORAL_TABLET | Freq: Every day | ORAL | Status: DC

## 2014-07-28 NOTE — Progress Notes (Signed)
Patient ID: Stephen [name], male   DOB: March 30, 1961, 54 y.o.   MRN: [id number]    [address] [place], [place]  [address] Phone: [phone] Fax:  [phone]  Date:  07/28/2014   ID:  Stephen [name], DOB 05/08/1961, MRN [id number]  PCP:  Elsie Stain, MD   ASSESSMENT:  1. Coronary artery disease with anterior MI in November, asymptomatic 2. Hyperlipidemia with unknown current lipid status 3. ACE inhibitor related cough 4. Essential hypertension, poorly controlled 5. Dyspnea related to Brilinta  PLAN:  1. Discontinue lisinopril and start Diovan 80 mg daily 2. Switch to Clopidogril 75 mg per day and discontinue Brilinta in the end of February 3. Statin panel 4. P2Y12,  2-3 weeks after starting clopidogrel 5. Six-month clinical follow-up 6. Call if blood pressures remain elevated above 140   SUBJECTIVE: Stephen [name] is a 54 y.o. male who is doing relatively well. Seems somewhat depressed. Complains of decreased energy. No angina. He has a dry cough. He also complains of dyspnea. The dyspnea is not on exertion. It occurs at random without associated wheezing, cough, other complaints. He's had no chest discomfort. His back to exercise on a regular relatively regular basis.   Wt Readings from Last 3 Encounters:  07/28/14 206 lb (93.441 kg)  05/30/14 207 lb (93.895 kg)  05/25/14 207 lb 3.7 oz (94 kg)     Past Medical History  Diagnosis Date  . GERD (gastroesophageal reflux disease)   . Gout   . Hypertension   . Elevated glucose   . Seasonal allergies     uses advair in May  . ED (erectile dysfunction)   . Hyperlipidemia   . Achilles tendon injury     right  . Morton's neuroma     2014  . Tobacco abuse, + cigars 05/27/2014  . CAD S/P percutaneous coronary angioplasty 05/25/2014  . ST elevation myocardial infarction (STEMI) of anterior wall 05/25/14    Current Outpatient Prescriptions  Medication Sig Dispense Refill  . acetaminophen  (TYLENOL) 500 MG tablet Take 500-1,000 mg by mouth daily as needed (pain).    . ADVAIR DISKUS 100-50 MCG/DOSE AEPB   0  . aspirin 81 MG chewable tablet Chew 1 tablet (81 mg total) by mouth daily.    . carvedilol (COREG) 6.25 MG tablet Take 1 tablet (6.25 mg total) by mouth 2 (two) times daily with a meal. 60 tablet 6  . colchicine (COLCRYS) 0.6 MG tablet Take 0.6 mg by mouth daily as needed (gout symptoms).    . fluticasone (FLONASE) 50 MCG/ACT nasal spray Place 1 spray into both nostrils daily as needed for allergies.     . Ketotifen Fumarate (ALAWAY OP) Place 1 drop into both eyes daily as needed (seasonal allergies).    Marland Kitchen lisinopril (PRINIVIL,ZESTRIL) 5 MG tablet Take 1 tablet (5 mg total) by mouth daily. 30 tablet 6  . Multiple Vitamins-Minerals (ZINC PO) Take 1 tablet by mouth daily.    . nitroGLYCERIN (NITROSTAT) 0.4 MG SL tablet Place 1 tablet (0.4 mg total) under the tongue every 5 (five) minutes as needed for chest pain. 25 tablet 3  . rosuvastatin (CRESTOR) 20 MG tablet Take 1 tablet (20 mg total) by mouth daily at 6 PM. 30 tablet 6  . sildenafil (VIAGRA) 100 MG tablet Take 0.5 tablets (50 mg total) by mouth daily as needed for erectile dysfunction. 10 tablet 0  . Syringe, Disposable, 3 ML MISC Use every 28 days with 22g 1 inch needle.  Use for testosterone injection.  Disp 25 syringes and 25 needles 25 each 2  . testosterone cypionate (DEPOTESTOTERONE CYPIONATE) 200 MG/ML injection Inject 1 mL (200 mg total) into the muscle every 21 ( twenty-one) days. 10 mL 0  . ticagrelor (BRILINTA) 90 MG TABS tablet Take 1 tablet (90 mg total) by mouth 2 (two) times daily. 60 tablet 2   No current facility-administered medications for this visit.    Allergies:    Allergies  Allergen Reactions  . Claritin-D 12 Hour [Loratadine-Pseudoephedrine Er] Other (See Comments)    shaky  . Kiwi Extract Other (See Comments)    Throat irritation, swelling  . Penicillins Other (See Comments)    Unknown  childhood allergic reaction  . Simvastatin Other (See Comments)    Numbness in toes, raised A1C, affected memory, fatigue    Social History:  The patient  reports that he quit smoking about 2 months ago. His smoking use included Cigars. He has never used smokeless tobacco. He reports that he drinks about 1.2 oz of alcohol per week. He reports that he does not use illicit drugs.   ROS:  Please see the history of present illness.   No edema. Denies syncope. No transient neurological complaints.   All other systems reviewed and negative.   OBJECTIVE: VS:  BP 144/92 mmHg  Pulse 59  Ht 5\' 7"  (1.702 m)  Wt 206 lb (93.441 kg)  BMI 32.26 kg/m2 Well nourished, well developed, in no acute distress, obese HEENT: normal Neck: JVD flat. Carotid bruit absent  Cardiac:  normal S1, S2; RRR; no murmur Lungs:  clear to auscultation bilaterally, no wheezing, rhonchi or rales Abd: soft, nontender, no hepatomegaly Ext: Edema absent. Pulses 2+ Skin: warm and dry Neuro:  CNs 2-12 intact, no focal abnormalities noted  EKG:  Not repeated       Signed, Illene Labrador III, MD 07/28/2014 8:17 AM

## 2014-07-28 NOTE — Patient Instructions (Addendum)
Your physician has recommended you make the following change in your medication:  1) Complete your supply of Brilinta, Then start Plavix 75mg  daily, an Rx has been sent to your pharmacy 2) STOP Lisinopril 3) START Diovan 80mg  daily, an Rx has ben sent to your pharmacy  Lab Toad: Lipid, Hepatic  Your physician recommends that you return for lab work the end of February. Please have this done at the [facility]. On the first floor. You may walk in you do not need an appontment   Measure your blood pressure 3-4 times a week call the office if your BP is consistently over 140/90  Your physician wants you to follow-up in: 6 months with Dr.Smith. You will receive a reminder letter in the mail two months in advance. If you don't receive a letter, please call our office to schedule the follow-up appointment.

## 2014-07-30 ENCOUNTER — Telehealth: Payer: Self-pay | Admitting: *Deleted

## 2014-07-30 ENCOUNTER — Other Ambulatory Visit: Payer: Self-pay | Admitting: *Deleted

## 2014-07-30 DIAGNOSIS — E785 Hyperlipidemia, unspecified: Secondary | ICD-10-CM

## 2014-07-30 NOTE — Telephone Encounter (Signed)
Called pt to let him know that his lab results were good and that Dr. Tamala Julian wanted him to have lipid/hepatic checked again in six months. He stated verbal understanding however he was not able to make that appointment today, but stated that he would call back.

## 2014-07-30 NOTE — Telephone Encounter (Signed)
-----   Message from Sinclair Grooms, MD sent at 07/29/2014  6:14 PM EST ----- Outstanding results. Continue same dose.Recheck in 6 months.

## 2014-08-06 ENCOUNTER — Telehealth: Payer: Self-pay | Admitting: Interventional Cardiology

## 2014-08-06 NOTE — Telephone Encounter (Signed)
Okay to stay on Brilinta and not switch to plavix.  Need to increase Diovan to 160 mg daily. Okay to use two 80 mg tabs until new prescription for 160 mg tablets is picked up.   Monitor BP on 160 mg daily.

## 2014-08-06 NOTE — Telephone Encounter (Signed)
Pt calling stating that he was told to call in the office to report to Dr Tamala Julian of his BP readings since starting new med Diovan.  Pt reports his BP readings have consistently been running in the 140s/70s. And HR running in the 60s. Pt states his last reading was 145/78 HR 60.  Pt denies any cp, sob, doe, dizziness, HA, blurred vision, palpitations, pre-syncopal or syncopal issues at this time.  Pt states he is actually feeling pretty good, and ran 3 miles this morning. Pt also wanted to ask Dr Tamala Julian if he could continue staying on Brilinta instead of switching over to Plavix, for his c/o sob have diminished over time while taking the Brilinta.  Pt states he has never switched over to the Plavix, he has just continued taking the Brilinta.  Informed the pt that Dr Tamala Julian and covering CMA are currently out of the office today, but I will route this message to the both of them for further review and recommendation and someone will follow-up with him thereafter. Pt verbalized understanding and agrees with this plan.

## 2014-08-06 NOTE — Telephone Encounter (Signed)
New message      Pt c/o BP issue:  1. What are your last 5 BP readings? 145/78 pulse 60 this am 2. Are you having any other symptoms (ex. Dizziness, headache, blurred vision, passed out)? no 3. What is your medication issue? Pt want to stay on brilinta instead of changing to plavix.  Pt has not been sob in a while. Pt is in a lot of meetings today----please leave a message on his vm

## 2014-08-07 MED ORDER — VALSARTAN 160 MG PO TABS: 160.0000 mg | ORAL_TABLET | Freq: Every day | ORAL | Status: DC

## 2014-08-07 NOTE — Telephone Encounter (Signed)
Spoke with pt in regards to changes in medication. Informed pt that Dr. Tamala Julian said it was ok to stay on the [place].   Informed pt that Dr. Tamala Julian wanted to increase his Diovan to 160mg  daily. Verified pharmacy and told pt I would send prescription over to pharmacy. Also informed pt to monitor BP on the Diovan 160mg . Told pt to call us with any issues or concerns should they arise with this medication change. Pt verbalized understanding and was in agreement with this plan.

## 2014-08-18 ENCOUNTER — Other Ambulatory Visit: Payer: Self-pay

## 2014-08-18 MED ORDER — CARVEDILOL 6.25 MG PO TABS: 6.2500 mg | ORAL_TABLET | Freq: Two times a day (BID) | ORAL | Status: DC

## 2014-08-18 MED ORDER — ROSUVASTATIN CALCIUM 20 MG PO TABS: 20.0000 mg | ORAL_TABLET | Freq: Every day | ORAL | Status: DC

## 2014-08-18 NOTE — Telephone Encounter (Signed)
Rx sent to pharmacy   

## 2014-08-19 ENCOUNTER — Other Ambulatory Visit: Payer: Self-pay | Admitting: *Deleted

## 2014-08-19 MED ORDER — VALSARTAN 160 MG PO TABS: 160.0000 mg | ORAL_TABLET | Freq: Every day | ORAL | Status: DC

## 2014-08-19 MED ORDER — BRILINTA 90 MG PO TABS: 90.0000 mg | ORAL_TABLET | Freq: Two times a day (BID) | ORAL | Status: DC

## 2014-08-19 NOTE — Telephone Encounter (Signed)
Refill

## 2014-10-17 ENCOUNTER — Other Ambulatory Visit: Payer: Self-pay | Admitting: Interventional Cardiology

## 2014-11-12 ENCOUNTER — Telehealth: Payer: Self-pay | Admitting: Interventional Cardiology

## 2014-11-12 DIAGNOSIS — I10 Essential (primary) hypertension: Secondary | ICD-10-CM

## 2014-11-12 MED ORDER — VALSARTAN-HYDROCHLOROTHIAZIDE 160-12.5 MG PO TABS: 1.0000 | ORAL_TABLET | Freq: Every day | ORAL | Status: DC

## 2014-11-12 NOTE — Telephone Encounter (Signed)
New message      Pt c/o BP issue: STAT if pt c/o blurred vision, one-sided weakness or slurred speech  1. What are your last 5 BP readings?  Today 146/87 HR 50, 26th 139/83 HR 53, 25th 145/84 HR 52, 24th  152/80 HR 60 pt took allergy rx on this day, 19th 143/77 HR 54, 14th 138/78 HR 69  2. Are you having any other symptoms (ex. Dizziness, headache, blurred vision, passed out)?  No---pt feel fine  3. What is your BP issue?  Pt is calling to give bp readings.  If pt can't take advair for allergies because it may raise his bp, what can he take?

## 2014-11-12 NOTE — Telephone Encounter (Signed)
Returned pt call. Adv him that his bp readings have been reviewed by Dr.Smith. Pt is not getting good bp control. Dr.Smith wants him to change his valsartan to an hctz component. An Rx for valsartan hctz 160mg /12.5mg  has been sent to pt mail order pharmacy, pt instructed to begin medication once received. Pt will come to the office on 5/17 for a bmet. Adv pt to continue to monitor his bp and call the office if it is consistently > 140/90.adv him it is ok for him to use his Advair and it should not affect his bp.pt thanked me for my assistance and verbalized understanding.

## 2014-12-02 ENCOUNTER — Other Ambulatory Visit (INDEPENDENT_AMBULATORY_CARE_PROVIDER_SITE_OTHER): Payer: Managed Care, Other (non HMO) | Admitting: *Deleted

## 2014-12-02 DIAGNOSIS — I10 Essential (primary) hypertension: Secondary | ICD-10-CM | POA: Diagnosis not present

## 2014-12-02 LAB — BASIC METABOLIC PANEL
BUN: 12 mg/dL (ref 6–23)
CO2: 32 mEq/L (ref 19–32)
Calcium: 10.6 mg/dL — ABNORMAL HIGH (ref 8.4–10.5)
Chloride: 100 mEq/L (ref 96–112)
Creatinine, Ser: 1.01 mg/dL (ref 0.40–1.50)
GFR: 81.98 mL/min (ref >60.00)
Glucose, Bld: 95 mg/dL (ref 70–99)
Potassium: 4.2 mEq/L (ref 3.5–5.1)
Sodium: 136 mEq/L (ref 135–145)

## 2014-12-10 ENCOUNTER — Telehealth: Payer: Self-pay

## 2014-12-10 NOTE — Telephone Encounter (Signed)
-----   Message from Belva Crome, MD sent at 12/02/2014  6:46 PM EDT ----- The labs are okay on the higher dose of Diovan. His blood pressure improved?

## 2014-12-10 NOTE — Telephone Encounter (Signed)
Pt aware of lab results The labs are okay on the higher dose of Diovan. Called to ck on pt. Pt sts that his bp has been controlled since adding the HCTZ to his valsartan. He cks his bp a couple of times a week and keeps a bp log. He has had a couple of reading with his systolic in the 599'J. When his bp is that low he reports feeling a little dizzy. Overall he feels he is doing well. Pt has an appt with Dr.Smith in July. He will bring his readings with him to his o/v

## 2014-12-25 ENCOUNTER — Other Ambulatory Visit: Payer: Self-pay | Admitting: Interventional Cardiology

## 2014-12-26 NOTE — Telephone Encounter (Signed)
Per note 1.11.16 and 1.20.16

## 2015-01-27 ENCOUNTER — Encounter: Payer: Self-pay | Admitting: Interventional Cardiology

## 2015-01-27 ENCOUNTER — Other Ambulatory Visit (INDEPENDENT_AMBULATORY_CARE_PROVIDER_SITE_OTHER): Payer: Managed Care, Other (non HMO)

## 2015-01-27 ENCOUNTER — Ambulatory Visit (INDEPENDENT_AMBULATORY_CARE_PROVIDER_SITE_OTHER): Payer: Managed Care, Other (non HMO) | Admitting: Interventional Cardiology

## 2015-01-27 VITALS — BP 114/74 | HR 76 | Ht 67.0 in | Wt 192.0 lb

## 2015-01-27 DIAGNOSIS — E785 Hyperlipidemia, unspecified: Secondary | ICD-10-CM

## 2015-01-27 DIAGNOSIS — I2510 Atherosclerotic heart disease of native coronary artery without angina pectoris: Secondary | ICD-10-CM

## 2015-01-27 DIAGNOSIS — I10 Essential (primary) hypertension: Secondary | ICD-10-CM

## 2015-01-27 NOTE — Progress Notes (Signed)
Cardiology Office Note   Date:  01/27/2015   ID:  Stephen [name], DOB [birth year], MRN [id number]  PCP:  Elsie Stain, MD  Cardiologist:  Sinclair Grooms, MD   Chief Complaint  Patient presents with  . Coronary Artery Disease      History of Present Illness: Stephen [name] is a 55 y.o. male who presents for coronary artery disease, LAD stent during anterior ST elevation MI (Promus Premier 18 mm postdilated to 3.75), and hyperlipidemia. Some prior history of glucose intolerance.  Doing relatively well. Running without chest discomfort. Has "indigestion" it is a tightness in the chest a goes away with belching. This particular symptom occurs at random and is not exertion related. If he is able to belch a goes completely away. No episodes lasted longer than several minutes (less than 5).    Past Medical History  Diagnosis Date  . GERD (gastroesophageal reflux disease)   . Gout   . Hypertension   . Elevated glucose   . Seasonal allergies     uses advair in May  . ED (erectile dysfunction)   . Hyperlipidemia   . Achilles tendon injury     right  . Morton's neuroma     2014  . Tobacco abuse, + cigars 05/27/2014  . CAD S/P percutaneous coronary angioplasty 05/25/2014  . ST elevation myocardial infarction (STEMI) of anterior wall 05/25/14    Past Surgical History  Procedure Laterality Date  . Hospital / asthma      multiple times as child  . Fx l forearm closed fx  as a child  . Coronary angioplasty with stent placement  05/25/14    Resolute integrity 3.5 X 18 mm DES 3.75 mm  . Left heart catheterization with coronary angiogram N/A 05/25/2014    Procedure: LEFT HEART CATHETERIZATION WITH CORONARY ANGIOGRAM;  Surgeon: Sinclair Grooms, MD;  Location: [facility] CATH LAB;  Service: Cardiovascular;  Laterality: N/A;  . Percutaneous coronary stent intervention (pci-s)  05/25/2014    Procedure: PERCUTANEOUS CORONARY STENT INTERVENTION (PCI-S);  Surgeon: Sinclair Grooms, MD;   Location: [facility] CATH LAB;  Service: Cardiovascular;;  PROX LAD      Current Outpatient Prescriptions  Medication Sig Dispense Refill  . acetaminophen (TYLENOL) 500 MG tablet Take 500-1,000 mg by mouth daily as needed (pain).    . ADVAIR DISKUS 100-50 MCG/DOSE AEPB Inhale 1 puff into the lungs daily as needed (allergies).   0  . aspirin 81 MG chewable tablet Chew 1 tablet (81 mg total) by mouth daily.    Marland Kitchen BRILINTA 90 MG TABS tablet Take 1 tablet by mouth two  times daily 180 tablet 2  . carvedilol (COREG) 6.25 MG tablet TAKE 1 TABLET BY MOUTH 2  TIMES DAILY WITH MEALS 180 tablet 2  . colchicine (COLCRYS) 0.6 MG tablet Take 0.6 mg by mouth daily as needed (gout symptoms).    . fluticasone (FLONASE) 50 MCG/ACT nasal spray Place 1 spray into both nostrils daily as needed for allergies.     . Ketotifen Fumarate (ALAWAY OP) Place 1 drop into both eyes daily as needed (seasonal allergies).    . Multiple Vitamins-Minerals (ZINC PO) Take 1 tablet by mouth daily.    . nitroGLYCERIN (NITROSTAT) 0.4 MG SL tablet Place 1 tablet (0.4 mg total) under the tongue every 5 (five) minutes as needed for chest pain. 25 tablet 3  . rosuvastatin (CRESTOR) 20 MG tablet Take 1 tablet (20 mg total) by mouth daily at 6  PM. 180 tablet 1  . sildenafil (VIAGRA) 100 MG tablet Take 0.5 tablets (50 mg total) by mouth daily as needed for erectile dysfunction. 10 tablet 0  . Syringe, Disposable, 3 ML MISC Use every 28 days with 22g 1 inch needle.  Use for testosterone injection.  Disp 25 syringes and 25 needles 25 each 2  . testosterone cypionate (DEPOTESTOTERONE CYPIONATE) 200 MG/ML injection Inject 1 mL (200 mg total) into the muscle every 21 ( twenty-one) days. 10 mL 0  . valsartan-hydrochlorothiazide (DIOVAN HCT) 160-12.5 MG per tablet Take 1 tablet by mouth daily. 90 tablet 2   No current facility-administered medications for this visit.    Allergies:   Claritin-d 12 hour; Kiwi extract; Penicillins; and Simvastatin     Social History:  The patient  reports that he quit smoking about 8 months ago. His smoking use included Cigars. He has never used smokeless tobacco. He reports that he drinks about 1.2 oz of alcohol per week. He reports that he does not use illicit drugs.   Family History:  The patient's family history includes AAA (abdominal aortic aneurysm) in his father; Alcohol abuse in his brother; Dementia in his father; Hyperlipidemia in his sister; Hypertension in his father and sister; Stroke in his mother; Stroke (age of onset: 65) in his father. There is no history of Prostate cancer or Colon cancer.    ROS:  Please see the history of present illness.   Otherwise, review of systems are positive for numbness in both feet occasional dizziness and otherwise unremarkable..   All other systems are reviewed and negative.    PHYSICAL EXAM: VS:  BP 114/74 mmHg  Pulse 76  Ht 5\' 7"  (1.702 m)  Wt 87.091 kg (192 lb)  BMI 30.06 kg/m2  SpO2 97% , BMI Body mass index is 30.06 kg/(m^2). GEN: Well nourished, well developed, in no acute distress HEENT: normal Neck: no JVD, carotid bruits, or masses Cardiac: RRR; no murmurs, rubs, or gallops,no edema  Respiratory:  clear to auscultation bilaterally, normal work of breathing GI: soft, nontender, nondistended, + BS MS: no deformity or atrophy Skin: warm and dry, no rash Neuro:  Strength and sensation are intact Psych: euthymic mood, full affect   EKG:  EKG is not ordered today.    Recent Labs: 05/26/2014: Hemoglobin 17.6*; Platelets 255 07/28/2014: ALT 28 12/02/2014: BUN 12; Creatinine, Ser 1.01; Potassium 4.2; Sodium 136    Lipid Panel    Component Value Date/Time   CHOL 120 07/28/2014 0903   CHOL 240* 02/21/2014 1150   TRIG 121.0 07/28/2014 0903   HDL 37.00* 07/28/2014 0903   HDL 42 02/21/2014 1150   CHOLHDL 3 07/28/2014 0903   CHOLHDL 5.7* 02/21/2014 1150   VLDL 24.2 07/28/2014 0903   LDLCALC 59 07/28/2014 0903   LDLCALC 158* 02/21/2014  1150      Wt Readings from Last 3 Encounters:  01/27/15 87.091 kg (192 lb)  07/28/14 93.441 kg (206 lb)  05/30/14 93.895 kg (207 lb)      Other studies Reviewed: Additional studies/ records that were reviewed today include:     ASSESSMENT AND PLAN:  1. CAD in native artery Denies exertional discomfort. The "indigestion" will be monitored for now. Because of this we will perform a stress Cardiolite in November prior to consideration of reverting from dual antiplatelets therapy to aspirin.  2. Hyperlipidemia with target LDL less than 70 Needs to be reevaluated.  3. Essential hypertension Controlled    Current medicines are reviewed at  length with the patient today.  The patient has concerns regarding medicines. He feels that lower extremity numbness could be related to statin therapy.  The following changes have been made:  No change  Labs/ tests ordered today include:  No orders of the defined types were placed in this encounter.     Disposition:   FU with HS in 6 months  Signed, Sinclair Grooms, MD  01/27/2015 9:10 AM    [facility] [address], [place], [place]  [address] Phone: [phone]; Fax: [phone]

## 2015-01-27 NOTE — Patient Instructions (Signed)
Medication Instructions:  Your physician recommends that you continue on your current medications as directed. Please refer to the Current Medication list given to you today.   Labwork: Please have your fasting lipid and liver panel lab results forwarded to our office  Testing/Procedures: Your physician has requested that you have en exercise stress myoview. For further information please visit [email or web address]. Please follow instruction sheet, as given. (To be scheduled in Nov 2016)   Follow-Up: Your physician wants you to follow-up in: 6 months with Dr.Smith You will receive a reminder letter in the mail two months in advance. If you don't receive a letter, please call our office to schedule the follow-up appointment.   Any Other Special Instructions Will Be Listed Below (If Applicable).

## 2015-01-30 ENCOUNTER — Encounter: Payer: Self-pay | Admitting: Interventional Cardiology

## 2015-02-05 ENCOUNTER — Ambulatory Visit (INDEPENDENT_AMBULATORY_CARE_PROVIDER_SITE_OTHER): Payer: Managed Care, Other (non HMO) | Admitting: Family Medicine

## 2015-02-05 ENCOUNTER — Encounter: Payer: Self-pay | Admitting: Family Medicine

## 2015-02-05 VITALS — BP 128/82 | HR 60 | Temp 98.4°F | Wt 201.2 lb

## 2015-02-05 DIAGNOSIS — Z789 Other specified health status: Secondary | ICD-10-CM

## 2015-02-05 DIAGNOSIS — Z0000 Encounter for general adult medical examination without abnormal findings: Secondary | ICD-10-CM

## 2015-02-05 DIAGNOSIS — Z1211 Encounter for screening for malignant neoplasm of colon: Secondary | ICD-10-CM

## 2015-02-05 DIAGNOSIS — I2510 Atherosclerotic heart disease of native coronary artery without angina pectoris: Secondary | ICD-10-CM

## 2015-02-05 NOTE — Patient Instructions (Signed)
Don't change your meds for now.  Go to the lab on the way out.  We'll contact you with your lab report. Take care.  Glad to see you.  

## 2015-02-05 NOTE — Progress Notes (Signed)
Pre visit review using our clinic review tool, if applicable. No additional management support is needed unless otherwise documented below in the visit note.  CPE- See plan.  Routine anticipatory guidance given to patient.  See health maintenance. Tetanus 2011 Flu done yearly.   Shingles not due PNA not due D/w patient [id number] for colon cancer screening, including IFOB vs. colonoscopy.  Risks and benefits of both were discussed and patient voiced understanding.  Pt elects for: IFOB.  PSA per urology.   Living will d/w pt. Wife is designated if he is incapacitated.  Diet and exercise d/w pt.  Diet is good.  Now running 4 miles, doing well.   Needs cotinine testing done for work.  Nonsmoker.   CAD. Now running 4 miles, doing well.  No CP, SOB, BLE edema.  He has has f/u with cards prev and will have f/u stress test done this fall.    PMH and SH reviewed  Meds, vitals, and allergies reviewed.   ROS: See HPI.  Otherwise negative.    GEN: nad, alert and oriented HEENT: mucous membranes moist NECK: supple w/o LA CV: rrr. PULM: ctab, no inc wob ABD: soft, +bs EXT: no edema SKIN: no acute rash

## 2015-02-06 DIAGNOSIS — Z0000 Encounter for general adult medical examination without abnormal findings: Secondary | ICD-10-CM | POA: Insufficient documentation

## 2015-02-06 NOTE — Assessment & Plan Note (Signed)
Per cards  

## 2015-02-06 NOTE — Assessment & Plan Note (Signed)
Routine anticipatory guidance given to patient.  See health maintenance. Tetanus 2011 Flu done yearly.   Shingles not due PNA not due D/w patient QG:BEEFEOF for colon cancer screening, including IFOB vs. colonoscopy.  Risks and benefits of both were discussed and patient voiced understanding.  Pt elects for: IFOB.  PSA per urology.   Living will d/w pt. Wife is designated if he is incapacitated.  Diet and exercise d/w pt.  Diet is good.  Now running 4 miles, doing well.   Needs cotinine testing done for work.  Nonsmoker.  I'll fill out his work forms when the data is available.

## 2015-02-10 ENCOUNTER — Telehealth: Payer: Self-pay

## 2015-02-10 DIAGNOSIS — E785 Hyperlipidemia, unspecified: Secondary | ICD-10-CM

## 2015-02-10 NOTE — Telephone Encounter (Signed)
Pt aware of lab results. At [facility]. Recheck 1 year Pt verbalized understanding.

## 2015-02-10 NOTE — Telephone Encounter (Signed)
-----   Message from Belva Crome, MD sent at 02/08/2015  3:46 PM EDT ----- At [employer]. Recheck 1 year

## 2015-02-11 LAB — NICOTINE/COTININE METABOLITES
Cotinine: NOT DETECTED ng/mL
Nicotine: NOT DETECTED ng/mL

## 2015-02-18 ENCOUNTER — Encounter: Payer: Self-pay | Admitting: *Deleted

## 2015-02-18 LAB — FECAL OCCULT BLOOD, IMMUNOCHEMICAL: Fecal Occult Bld: NEGATIVE

## 2015-03-30 ENCOUNTER — Encounter: Payer: Self-pay | Admitting: Family Medicine

## 2015-03-31 ENCOUNTER — Telehealth: Payer: Self-pay | Admitting: Family Medicine

## 2015-03-31 NOTE — Telephone Encounter (Signed)
Wife dropped off form for work that needs to be filled out.  Placing on [employer]

## 2015-03-31 NOTE — Telephone Encounter (Signed)
Physician signature needed to complete form. Will await Dr. Carole Civil return for paperwork to be finished.

## 2015-04-02 NOTE — Telephone Encounter (Signed)
Notified patient that form was ready to be picked up. Patient states that he would most likely pick it up tomorrow. Thanks!

## 2015-04-21 ENCOUNTER — Other Ambulatory Visit: Payer: Self-pay | Admitting: Family Medicine

## 2015-05-19 ENCOUNTER — Encounter (HOSPITAL_COMMUNITY): Payer: Managed Care, Other (non HMO)

## 2015-05-27 ENCOUNTER — Telehealth (HOSPITAL_COMMUNITY): Payer: Self-pay

## 2015-05-27 NOTE — Telephone Encounter (Signed)
Patient given detailed instructions per Myocardial Perfusion Study Information Sheet for the test on 06-01-2015 at 0730. Patient notified to arrive 15 minutes early and that it is imperative to arrive on time for appointment to keep from having the test rescheduled.  If you need to cancel or reschedule your appointment, please call the office within 24 hours of your appointment. Failure to do so may result in a cancellation of your appointment, and a $50 no show fee. Patient verbalized understanding.Oletta Lamas, Emillia Weatherly A

## 2015-06-01 ENCOUNTER — Ambulatory Visit (HOSPITAL_COMMUNITY): Payer: Managed Care, Other (non HMO) | Attending: Cardiovascular Disease

## 2015-06-01 DIAGNOSIS — I10 Essential (primary) hypertension: Secondary | ICD-10-CM | POA: Diagnosis not present

## 2015-06-01 DIAGNOSIS — R9439 Abnormal result of other cardiovascular function study: Secondary | ICD-10-CM | POA: Insufficient documentation

## 2015-06-01 DIAGNOSIS — R079 Chest pain, unspecified: Secondary | ICD-10-CM | POA: Diagnosis not present

## 2015-06-01 DIAGNOSIS — I2510 Atherosclerotic heart disease of native coronary artery without angina pectoris: Secondary | ICD-10-CM

## 2015-06-01 LAB — MYOCARDIAL PERFUSION IMAGING
LV dias vol: 133 mL
LV sys vol: 54 mL
Peak HR: 108 {beats}/min
RATE: 0.33
Rest HR: 76 {beats}/min
SDS: 3
SRS: 2
SSS: 5
TID: 0.93

## 2015-06-01 MED ORDER — TECHNETIUM TC 99M SESTAMIBI GENERIC - CARDIOLITE
11.0000 | Freq: Once | INTRAVENOUS | Status: AC | PRN
  Administered 2015-06-01: 11 via INTRAVENOUS

## 2015-06-01 MED ORDER — TECHNETIUM TC 99M SESTAMIBI GENERIC - CARDIOLITE
31.4000 | Freq: Once | INTRAVENOUS | Status: AC | PRN
  Administered 2015-06-01: 31 via INTRAVENOUS

## 2015-06-01 MED ORDER — REGADENOSON 0.4 MG/5ML IV SOLN
0.4000 mg | Freq: Once | INTRAVENOUS | Status: AC
  Administered 2015-06-01: 0.4 mg via INTRAVENOUS

## 2015-06-02 ENCOUNTER — Telehealth: Payer: Self-pay

## 2015-06-02 ENCOUNTER — Other Ambulatory Visit: Payer: Self-pay

## 2015-06-02 NOTE — Telephone Encounter (Signed)
-----   Message from Belva Crome, MD sent at 06/01/2015  9:30 PM EST ----- Study is low risk. Ok to stop Brilinta. Continue aspirin

## 2015-06-02 NOTE — Telephone Encounter (Signed)
Pt aware of myoview results and Dr.Smith's recommendation. Study is low risk. Ok to stop Brilinta. Continue aspirin Pt sts that he is doing well and does not fell he need to f/u with Sandria Senter that is scheduled this month. He will f/u as planned with Dr.Smith in Feb 2017 Pt appreciative for the call and verbalized understanding.

## 2015-06-08 ENCOUNTER — Ambulatory Visit: Payer: Managed Care, Other (non HMO) | Admitting: Cardiology

## 2015-07-02 ENCOUNTER — Other Ambulatory Visit: Payer: Self-pay | Admitting: Interventional Cardiology

## 2015-07-08 ENCOUNTER — Telehealth: Payer: Self-pay | Admitting: Interventional Cardiology

## 2015-07-08 NOTE — Telephone Encounter (Signed)
Returned pt call. Pt reports bp readings below have been elevated the last couple of days. Pt is asymptomatic. He is taking all medications ad prescribed and adheres to a low sodium diet. Pt reports that he has been checking his bp first thing in the morning before he takes his morning medications. Recommended pt continue to monitor his bp, he should try checking it kid morning 1-2 hours after he has taken his meds. Adv pt that I will fed Dr.Smith an update and call back with his recommendation. Pt agreeable with plan and verbalized understanding.  Per pt ok to send Dr.Smith's response via my chart

## 2015-07-08 NOTE — Telephone Encounter (Signed)
Pt c/o BP issue: STAT if pt c/o blurred vision, one-sided weakness or slurred speech  1. What are your last 5 BP readings? 12/19 am- 145/84, 12/20 am- 150/88, 12/21 am- 158/92 p 55   2. Are you having any other symptoms (ex. Dizziness, headache, blurred vision, passed out)? No- pt feels fine  3. What is your BP issue? Pt BP has been high last few days

## 2015-07-09 NOTE — Telephone Encounter (Signed)
Further increase carvedilol to 12.5 mg twice a day. Encouraged him to measure heart rate as well as blood pressure. Informed him that heart rate may dip into the 50s and he should inform us of any heart rates that are less than 50 beats per minute.

## 2015-07-10 NOTE — Telephone Encounter (Signed)
I called the patient with Dr. Thompson Caul recommendations. He states that for the last 2 days his BP has been much better- today he was 137/82. He has been watching his sodium intake very closely. He would like to keep his same dose of carvedilol for now and monitor his BP readings.  I advised he could do this and to please call back if BP's trend up again and we can change his medication at that time. He verbalizes understanding.

## 2015-08-06 ENCOUNTER — Other Ambulatory Visit: Payer: Self-pay | Admitting: Family Medicine

## 2015-08-06 NOTE — Telephone Encounter (Signed)
Electronic refill request. Last Filled:   05/31/2013

## 2015-08-20 ENCOUNTER — Encounter: Payer: Self-pay | Admitting: Interventional Cardiology

## 2015-08-20 ENCOUNTER — Ambulatory Visit (INDEPENDENT_AMBULATORY_CARE_PROVIDER_SITE_OTHER): Payer: Managed Care, Other (non HMO) | Admitting: Interventional Cardiology

## 2015-08-20 VITALS — BP 142/84 | HR 54 | Ht 67.0 in | Wt 211.1 lb

## 2015-08-20 DIAGNOSIS — E785 Hyperlipidemia, unspecified: Secondary | ICD-10-CM | POA: Diagnosis not present

## 2015-08-20 DIAGNOSIS — I2510 Atherosclerotic heart disease of native coronary artery without angina pectoris: Secondary | ICD-10-CM

## 2015-08-20 DIAGNOSIS — Z720 Tobacco use: Secondary | ICD-10-CM | POA: Diagnosis not present

## 2015-08-20 DIAGNOSIS — I10 Essential (primary) hypertension: Secondary | ICD-10-CM | POA: Diagnosis not present

## 2015-08-20 MED ORDER — VALSARTAN-HYDROCHLOROTHIAZIDE 320-12.5 MG PO TABS: 1.0000 | ORAL_TABLET | Freq: Every day | ORAL | Status: DC

## 2015-08-20 MED ORDER — CARVEDILOL 3.125 MG PO TABS: 3.1250 mg | ORAL_TABLET | Freq: Two times a day (BID) | ORAL | Status: DC

## 2015-08-20 NOTE — Patient Instructions (Signed)
Medication Instructions:  Your physician has recommended you make the following change in your medication:  1) DECREASE Carvedilol to 3.125mg  twice daily 2) INCREASE Diovan to 320-12.5mg  daily  Labwork: Your physician recommends that you return for a FASTING lipid profile and alt in July 2017   Testing/Procedures: None ordered  Follow-Up: Your physician wants you to follow-up in: 1 year with Dr.Smith You will receive a reminder letter in the mail two months in advance. If you don't receive a letter, please call our office to schedule the follow-up appointment.   Any Other Special Instructions Will Be Listed Below (If Applicable). Your physician encouraged you to lose weight for better health.  Your physician discussed the importance of regular exercise and recommended that you start or continue a regular exercise program for good health.       If you need a refill on your cardiac medications before your next appointment, please call your pharmacy.

## 2015-08-20 NOTE — Progress Notes (Signed)
Cardiology Office Note   Date:  08/20/2015   ID:  Stephen [name], DOB June 17, 1961, MRN [id number]  PCP:  Elsie Stain, MD  Cardiologist:  Sinclair Grooms, MD   Chief Complaint  Patient presents with  . Coronary Artery Disease      History of Present Illness: Stephen [name] is a 55 y.o. male who presents for anterior ST elevation myocardial infarction treated with LAD drug-eluting stents, hypertension, hyperlipidemia, prior tobacco abuse, erectile dysfunction, and glucose intolerance.  Having erectile dysfunction but otherwise no complaints. No chest discomfort. Now off Brilinta. He denies orthopnea, PND, and exertional chest discomfort, he has not had syncope. He denies dizziness. He is concerned that beta blocker therapy is influencing erectile function and causing some difficulty with sleep.    Past Medical History  Diagnosis Date  . GERD (gastroesophageal reflux disease)   . Gout   . Hypertension   . Elevated glucose   . Seasonal allergies     uses advair in May  . ED (erectile dysfunction)   . Hyperlipidemia   . Achilles tendon injury     right  . Morton's neuroma     2014  . Tobacco abuse, + cigars 05/27/2014  . CAD S/P percutaneous coronary angioplasty 05/25/2014  . ST elevation myocardial infarction (STEMI) of anterior wall ([place]) 05/25/14    Past Surgical History  Procedure Laterality Date  . Hospital / asthma      multiple times as child  . Fx l forearm closed fx  as a child  . Coronary angioplasty with stent placement  05/25/14    Resolute integrity 3.5 X 18 mm DES 3.75 mm  . Left heart catheterization with coronary angiogram N/A 05/25/2014    Procedure: LEFT HEART CATHETERIZATION WITH CORONARY ANGIOGRAM;  Surgeon: Sinclair Grooms, MD;  Location: [facility] CATH LAB;  Service: Cardiovascular;  Laterality: N/A;  . Percutaneous coronary stent intervention (pci-s)  05/25/2014    Procedure: PERCUTANEOUS CORONARY STENT INTERVENTION (PCI-S);  Surgeon: Sinclair Grooms, MD;  Location: [facility] CATH LAB;  Service: Cardiovascular;;  PROX LAD      Current Outpatient Prescriptions  Medication Sig Dispense Refill  . acetaminophen (TYLENOL) 500 MG tablet Take 500-1,000 mg by mouth daily as needed (pain).    . ADVAIR DISKUS 100-50 MCG/DOSE AEPB Inhale 1 puff into the lungs daily as needed (allergies).   0  . ADVAIR DISKUS 100-50 MCG/DOSE AEPB inhale 1 dose by mouth twice a day 60 each 3  . aspirin 81 MG chewable tablet Chew 1 tablet (81 mg total) by mouth daily.    . carvedilol (COREG) 6.25 MG tablet TAKE 1 TABLET BY MOUTH 2  TIMES DAILY WITH MEALS 180 tablet 2  . colchicine 0.6 MG tablet Take 1 tablet by mouth  daily as needed 90 tablet 0  . Ketotifen Fumarate (ALAWAY OP) Place 1 drop into both eyes daily as needed (seasonal allergies).    . Multiple Vitamins-Minerals (ZINC PO) Take 1 tablet by mouth daily.    . nitroGLYCERIN (NITROSTAT) 0.4 MG SL tablet Place 1 tablet (0.4 mg total) under the tongue every 5 (five) minutes as needed for chest pain. 25 tablet 3  . rosuvastatin (CRESTOR) 20 MG tablet Take 1 tablet by mouth  daily at 6pm 90 tablet 1  . sildenafil (VIAGRA) 100 MG tablet Take 0.5 tablets (50 mg total) by mouth daily as needed for erectile dysfunction. 10 tablet 0  . Syringe, Disposable, 3 ML MISC Use every  28 days with 22g 1 inch needle.  Use for testosterone injection.  Disp 25 syringes and 25 needles 25 each 2  . testosterone cypionate (DEPOTESTOTERONE CYPIONATE) 200 MG/ML injection Inject 1 mL (200 mg total) into the muscle every 21 ( twenty-one) days. 10 mL 0  . valsartan-hydrochlorothiazide (DIOVAN-HCT) 160-12.5 MG tablet Take 1 tablet by mouth  daily 90 tablet 1   No current facility-administered medications for this visit.    Allergies:   Claritin-d 12 hour; Kiwi extract; Penicillins; and Simvastatin    Social History:  The patient  reports that he quit smoking about 14 months ago. His smoking use included Cigars. He has never used  smokeless tobacco. He reports that he drinks about 1.2 oz of alcohol per week. He reports that he does not use illicit drugs.   Family History:  The patient's family history includes AAA (abdominal aortic aneurysm) in his father; Alcohol abuse in his brother; Dementia in his father; Hyperlipidemia in his sister; Hypertension in his father and sister; Stroke in his mother; Stroke (age of onset: 67) in his father. There is no history of Prostate cancer or Colon cancer.    ROS:  Please see the history of present illness.   Otherwise, review of systems are positive for insomnia, erectile dysfunction, dyspnea has improved off Brilinta, and some depression..   All other systems are reviewed and negative.    PHYSICAL EXAM: VS:  BP 142/84 mmHg  Pulse 54  Ht 5\' 7"  (1.702 m)  Wt 211 lb 1.9 oz (95.763 kg)  BMI 33.06 kg/m2 , BMI Body mass index is 33.06 kg/(m^2). GEN: Well nourished, well developed, in no acute distress HEENT: normal Neck: no JVD, carotid bruits, or masses Cardiac: RRR.  There is no murmur, rub, or gallop. There is no edema. Respiratory:  clear to auscultation bilaterally, normal work of breathing. GI: soft, nontender, nondistended, + BS MS: no deformity or atrophy Skin: warm and dry, no rash Neuro:  Strength and sensation are intact Psych: euthymic mood, full affect   EKG:  EKG is ordered today. The ekg reveals sinus bradycardia at 52 bpm, first degree AV block, otherwise unremarkable.   Recent Labs: 12/02/2014: BUN 12; Creatinine, Ser 1.01; Potassium 4.2; Sodium 136    Lipid Panel    Component Value Date/Time   CHOL 120 07/28/2014 0903   CHOL 240* 02/21/2014 1150   TRIG 121.0 07/28/2014 0903   HDL 37.00* 07/28/2014 0903   HDL 42 02/21/2014 1150   CHOLHDL 3 07/28/2014 0903   CHOLHDL 5.7* 02/21/2014 1150   VLDL 24.2 07/28/2014 0903   LDLCALC 59 07/28/2014 0903   LDLCALC 158* 02/21/2014 1150      Wt Readings from Last 3 Encounters:  08/20/15 211 lb 1.9 oz (95.763  kg)  06/01/15 201 lb (91.173 kg)  02/05/15 201 lb 4 oz (91.286 kg)      Other studies Reviewed: Additional studies/ records that were reviewed today include: Had liver lipid panel done in July at [facility]. The findings include LDL is less than 70. Total cholesterol less than 1:30.    ASSESSMENT AND PLAN:  1. CAD in native artery Stable without angina  2. Hyperlipidemia Last evaluation was January 2016. This will be reevaluated today.  3. Essential hypertension Slightly increased systolic  4. Tobacco abuse, + cigars Discontinued  5. Insomnia and erectile dysfunction  Possibly contributed to by beta blocker therapy   Current medicines are reviewed at length with the patient today.  The patient has the  following concerns regarding medicines: Erectile dysfunction and fatigue and insomnia possibly related to carvedilol..  The following changes/actions have been instituted:    Decrease carvedilol 3.125 mg twice a day especially given relative bradycardia and normal LVEF nuclear scintigraphy.  Increase Diovan HCT to 320/12.5 mg daily  Low salt diet  Exercise with weight loss  Labs/ tests ordered today include: We'll need to have a fasting liver and lipid panel done in July  No orders of the defined types were placed in this encounter.     Disposition:   FU with HS in 1 year  Signed, Sinclair Grooms, MD  08/20/2015 8:35 AM    [facility] [address], [place], [place]  [address] Phone: [phone]; Fax: [phone]

## 2016-01-28 ENCOUNTER — Telehealth: Payer: Self-pay | Admitting: Family Medicine

## 2016-01-28 NOTE — Telephone Encounter (Signed)
Pt called, has cpe 8/2 but needs spec labs for insurance health screening. He asks that you put orders in for cpe labs scheduled 7/31. Same as last visit, nicotine not needed.

## 2016-01-29 ENCOUNTER — Other Ambulatory Visit: Payer: Self-pay | Admitting: Family Medicine

## 2016-01-29 DIAGNOSIS — M109 Gout, unspecified: Secondary | ICD-10-CM

## 2016-01-29 DIAGNOSIS — E785 Hyperlipidemia, unspecified: Secondary | ICD-10-CM

## 2016-01-29 NOTE — Telephone Encounter (Signed)
Ordered cmet/lipid/uric acid. PSA was per uro.  That should cover it all.   Thanks.

## 2016-01-29 NOTE — Telephone Encounter (Signed)
Spoken and notified patient of Dr Duncan's comments. Patient verbalized understanding.  

## 2016-02-15 ENCOUNTER — Other Ambulatory Visit (INDEPENDENT_AMBULATORY_CARE_PROVIDER_SITE_OTHER): Payer: Managed Care, Other (non HMO)

## 2016-02-15 DIAGNOSIS — E785 Hyperlipidemia, unspecified: Secondary | ICD-10-CM

## 2016-02-15 DIAGNOSIS — M109 Gout, unspecified: Secondary | ICD-10-CM

## 2016-02-15 NOTE — Addendum Note (Signed)
Addended by: Marchia Bond on: 02/15/2016 08:20 AM   Modules accepted: Orders

## 2016-02-16 LAB — COMPREHENSIVE METABOLIC PANEL
ALT: 50 IU/L — ABNORMAL HIGH (ref 0–44)
AST: 35 IU/L (ref 0–40)
Albumin/Globulin Ratio: 1.8 (ref 1.2–2.2)
Albumin: 4.6 g/dL (ref 3.5–5.5)
Alkaline Phosphatase: 76 IU/L (ref 39–117)
BUN/Creatinine Ratio: 13 (ref 9–20)
BUN: 13 mg/dL (ref 6–24)
Bilirubin Total: 0.6 mg/dL (ref 0.0–1.2)
CO2: 26 mmol/L (ref 18–29)
Calcium: 9.7 mg/dL (ref 8.7–10.2)
Chloride: 97 mmol/L (ref 96–106)
Creatinine, Ser: 1.02 mg/dL (ref 0.76–1.27)
GFR calc Af Amer: 96 mL/min/{1.73_m2} (ref >59)
GFR calc non Af Amer: 83 mL/min/{1.73_m2} (ref >59)
Globulin, Total: 2.6 g/dL (ref 1.5–4.5)
Glucose: 92 mg/dL (ref 65–99)
Potassium: 4.9 mmol/L (ref 3.5–5.2)
Sodium: 139 mmol/L (ref 134–144)
Total Protein: 7.2 g/dL (ref 6.0–8.5)

## 2016-02-16 LAB — LIPID PANEL
Chol/HDL Ratio: 3 ratio units (ref 0.0–5.0)
Cholesterol, Total: 137 mg/dL (ref 100–199)
HDL: 46 mg/dL (ref >39)
LDL Calculated: 63 mg/dL (ref 0–99)
Triglycerides: 140 mg/dL (ref 0–149)
VLDL Cholesterol Cal: 28 mg/dL (ref 5–40)

## 2016-02-16 LAB — URIC ACID: Uric Acid: 7.2 mg/dL (ref 3.7–8.6)

## 2016-02-16 LAB — PLEASE NOTE

## 2016-02-17 ENCOUNTER — Encounter: Payer: Self-pay | Admitting: Family Medicine

## 2016-02-17 ENCOUNTER — Ambulatory Visit (INDEPENDENT_AMBULATORY_CARE_PROVIDER_SITE_OTHER): Payer: Managed Care, Other (non HMO) | Admitting: Family Medicine

## 2016-02-17 VITALS — BP 128/84 | HR 68 | Temp 98.7°F | Ht 67.0 in | Wt 211.2 lb

## 2016-02-17 DIAGNOSIS — Z1211 Encounter for screening for malignant neoplasm of colon: Secondary | ICD-10-CM | POA: Diagnosis not present

## 2016-02-17 DIAGNOSIS — Z0000 Encounter for general adult medical examination without abnormal findings: Secondary | ICD-10-CM | POA: Diagnosis not present

## 2016-02-17 DIAGNOSIS — G4700 Insomnia, unspecified: Secondary | ICD-10-CM

## 2016-02-17 DIAGNOSIS — Z7189 Other specified counseling: Secondary | ICD-10-CM

## 2016-02-17 DIAGNOSIS — Z8639 Personal history of other endocrine, nutritional and metabolic disease: Secondary | ICD-10-CM

## 2016-02-17 DIAGNOSIS — E785 Hyperlipidemia, unspecified: Secondary | ICD-10-CM

## 2016-02-17 MED ORDER — TESTOSTERONE CYPIONATE 200 MG/ML IM SOLN
140.0000 mg | INTRAMUSCULAR | 0 refills | Status: DC
Start: 2016-02-17 — End: 2018-04-09

## 2016-02-17 NOTE — Progress Notes (Signed)
Pre visit review using our clinic review tool, if applicable. No additional management support is needed unless otherwise documented below in the visit note. 

## 2016-02-17 NOTE — Patient Instructions (Addendum)
Go to the lab on the way out.  We'll contact you with your lab report. Take care.  Glad to see you.  Update me as needed.  I check with Tamala Julian about your LFTS and fill out your form in the meantime.

## 2016-02-17 NOTE — Progress Notes (Signed)
CPE- See plan.  Routine anticipatory guidance given to patient.  See health maintenance. Tetanus 2011 Flu done yearly Shingles not due PNA not due D/w patient [id number] for colon cancer screening, including IFOB vs. colonoscopy.  Risks and benefits of both were discussed and patient voiced understanding.  Pt elects for: IFOB.  PSA per urology.   Living will d/w pt. Wife is designated if he is incapacitated.  Diet and exercise d/w pt.   See below about exercise, he is working to get his distance back up.  Diet is good.   HIV and HCV screening d/w pt. HIV prev neg 2002.   Declined HCV screening.   He was promoted at work with higher stress noted.  He does still enjoy the job.  D/w pt.    Father in law died in the last year, after aspiration.  This was difficult for the patient.  Has had episodes of insomnia and lower mood in the meantime.  Exercise clearly helps in the meantime, when he feels well enough to run, not fatigued.  Had BB dose changed prev, per cards. He can get to sleep but then wakes in the middle of the night.  Tylenol pm helped some with insomnia.    Mild inc in LFTs on labs, d/w pt.  Other labs unremarkable.    Waist 39".  He is working on diet and exercise.    PMH and SH reviewed  Meds, vitals, and allergies reviewed.   ROS: Per HPI.  Unless specifically indicated otherwise in HPI, the patient denies:  General: fever. Eyes: acute vision changes ENT: sore throat Cardiovascular: chest pain Respiratory: SOB GI: vomiting GU: dysuria Musculoskeletal: acute back pain Derm: acute rash Neuro: acute motor dysfunction Psych: worsening mood Endocrine: polydipsia Heme: bleeding Allergy: hayfever  GEN: nad, alert and oriented HEENT: mucous membranes moist NECK: supple w/o LA CV: rrr. PULM: ctab, no inc wob ABD: soft, +bs EXT: no edema SKIN: no acute rash

## 2016-02-18 ENCOUNTER — Encounter: Payer: Self-pay | Admitting: *Deleted

## 2016-02-18 ENCOUNTER — Encounter: Payer: Self-pay | Admitting: Family Medicine

## 2016-02-18 ENCOUNTER — Telehealth: Payer: Self-pay | Admitting: Family Medicine

## 2016-02-18 DIAGNOSIS — G4700 Insomnia, unspecified: Secondary | ICD-10-CM | POA: Insufficient documentation

## 2016-02-18 DIAGNOSIS — R7401 Elevation of levels of liver transaminase levels: Secondary | ICD-10-CM

## 2016-02-18 DIAGNOSIS — R740 Nonspecific elevation of levels of transaminase and lactic acid dehydrogenase [LDH]: Principal | ICD-10-CM

## 2016-02-18 LAB — HEMOGLOBIN A1C
Est. average glucose Bld gHb Est-mCnc: 111 mg/dL
Hgb A1c MFr Bld: 5.5 % (ref 4.8–5.6)

## 2016-02-18 NOTE — Assessment & Plan Note (Signed)
He can try plain prn benadryl qhs to help with sleep, which may help his energy level and ability to exercise.  D/w pt . He agrees.

## 2016-02-18 NOTE — Telephone Encounter (Signed)
Call pt.  Talked to Dr. Tamala Julian.  Wouldn't change statin.  Stop any etoh and tylenol.  Recheck lfts in about 1 month.  Order is in.  Thanks.

## 2016-02-18 NOTE — Assessment & Plan Note (Signed)
Living will d/w pt.  Wife is designated if he is incapacitated.   

## 2016-02-18 NOTE — Assessment & Plan Note (Signed)
I'll check with cards about mild LFT elevation.  D/w pt.  Advised patient to cut back on etoh (mild intake) and tylenol in the meantime.   App help of all involved.

## 2016-02-18 NOTE — Telephone Encounter (Signed)
Patient advised.  Lab appt scheduled.  

## 2016-02-18 NOTE — Assessment & Plan Note (Signed)
Tetanus 2011 Flu done yearly Shingles not due PNA not due D/w patient [id number] for colon cancer screening, including IFOB vs. colonoscopy.  Risks and benefits of both were discussed and patient voiced understanding.  Pt elects for: IFOB.  PSA per urology.   Living will d/w pt. Wife is designated if he is incapacitated.  Diet and exercise d/w pt.   See below about exercise, he is working to get his distance back up.  Diet is good.   HIV and HCV screening d/w pt. HIV prev neg 2002.   Declined HCV screening.

## 2016-02-28 LAB — FECAL OCCULT BLOOD, IMMUNOCHEMICAL: Fecal Occult Bld: NEGATIVE

## 2016-03-01 ENCOUNTER — Encounter: Payer: Self-pay | Admitting: Family Medicine

## 2016-03-01 ENCOUNTER — Telehealth: Payer: Self-pay | Admitting: *Deleted

## 2016-03-01 NOTE — Telephone Encounter (Signed)
Note sent to patient.  I'll work on it as soon as I can.

## 2016-03-01 NOTE — Telephone Encounter (Signed)
Form placed in Dr. Josefine Class In [place].  Please see MyChart message from patient also.

## 2016-03-01 NOTE — Telephone Encounter (Signed)
Mr. Grise brought in a health screening form for his insurance to be filled out. Please call him when it is ready [phone]. Form placed in prescription tower.

## 2016-03-22 ENCOUNTER — Other Ambulatory Visit (INDEPENDENT_AMBULATORY_CARE_PROVIDER_SITE_OTHER): Payer: Managed Care, Other (non HMO)

## 2016-03-22 DIAGNOSIS — R740 Nonspecific elevation of levels of transaminase and lactic acid dehydrogenase [LDH]: Secondary | ICD-10-CM

## 2016-03-22 DIAGNOSIS — R7401 Elevation of levels of liver transaminase levels: Secondary | ICD-10-CM

## 2016-03-23 LAB — HEPATIC FUNCTION PANEL
ALT: 39 IU/L (ref 0–44)
AST: 23 IU/L (ref 0–40)
Albumin: 4.7 g/dL (ref 3.5–5.5)
Alkaline Phosphatase: 83 IU/L (ref 39–117)
Bilirubin Total: 0.5 mg/dL (ref 0.0–1.2)
Bilirubin, Direct: 0.14 mg/dL (ref 0.00–0.40)
Total Protein: 7.3 g/dL (ref 6.0–8.5)

## 2016-04-14 ENCOUNTER — Encounter: Payer: Self-pay | Admitting: Family Medicine

## 2016-07-05 ENCOUNTER — Telehealth: Payer: Self-pay | Admitting: Interventional Cardiology

## 2016-07-05 NOTE — Telephone Encounter (Signed)
Spoke with pt and advised that sleep medications would need to come from pt's PCP.  Pt states that he wanted to know if there were any OTC sleep meds that Dr. Tamala Julian felt were safe for him to try.  Dr. Tamala Julian said ok to try Melatonin.  Made pt aware of recommendation and was appreciative for assistance.

## 2016-07-05 NOTE — Telephone Encounter (Signed)
New message     Patient calling having problem sleeping still.   PCP prescribe benadryl.   Wants to know if Dr. Tamala Julian can prescribe him something.

## 2016-07-23 ENCOUNTER — Other Ambulatory Visit: Payer: Self-pay | Admitting: Interventional Cardiology

## 2016-09-05 ENCOUNTER — Encounter: Payer: Self-pay | Admitting: Interventional Cardiology

## 2016-09-05 ENCOUNTER — Ambulatory Visit (INDEPENDENT_AMBULATORY_CARE_PROVIDER_SITE_OTHER): Payer: Managed Care, Other (non HMO) | Admitting: Interventional Cardiology

## 2016-09-05 VITALS — BP 140/84 | HR 71 | Ht 67.0 in | Wt 199.6 lb

## 2016-09-05 DIAGNOSIS — I2510 Atherosclerotic heart disease of native coronary artery without angina pectoris: Secondary | ICD-10-CM | POA: Diagnosis not present

## 2016-09-05 DIAGNOSIS — E784 Other hyperlipidemia: Secondary | ICD-10-CM

## 2016-09-05 DIAGNOSIS — I10 Essential (primary) hypertension: Secondary | ICD-10-CM | POA: Diagnosis not present

## 2016-09-05 DIAGNOSIS — E7849 Other hyperlipidemia: Secondary | ICD-10-CM

## 2016-09-05 MED ORDER — NITROGLYCERIN 0.4 MG SL SUBL: 0.4000 mg | SUBLINGUAL_TABLET | SUBLINGUAL | 3 refills | Status: DC | PRN

## 2016-09-05 NOTE — Patient Instructions (Signed)
Medication Instructions:  None  Labwork: None  Testing/Procedures: None  Follow-Up: Your physician wants you to follow-up in: 1 year with Dr.Smith. You will receive a reminder letter in the mail two months in advance. If you don't receive a letter, please call our office to schedule the follow-up appointment.   Any Other Special Instructions Will Be Listed Below (If Applicable).  Target for your LDL (bad) cholesterol is 70.  Blood pressure target is 140/90 or less.     If you need a refill on your cardiac medications before your next appointment, please call your pharmacy.

## 2016-09-05 NOTE — Progress Notes (Signed)
Cardiology Office Note    Date:  09/05/2016   ID:  Stephen [name], DOB Jun 19, 1961, MRN [id number]  PCP:  Elsie Stain, MD  Cardiologist: Sinclair Grooms, MD   Chief Complaint  Patient presents with  . Coronary Artery Disease    History of Present Illness:  Stephen [name] is a 56 y.o. male who presents for anterior ST elevation myocardial infarction treated with LAD drug-eluting stents, hypertension, hyperlipidemia, prior tobacco abuse, erectile dysfunction,  He denies angina. He exercises regularly. He saw his primary earlier in 2017 and screening risk factor evaluation was unremarkable.   Past Medical History:  Diagnosis Date  . Achilles tendon injury    right  . CAD S/P percutaneous coronary angioplasty 05/25/2014  . ED (erectile dysfunction)   . Elevated glucose   . GERD (gastroesophageal reflux disease)   . Gout   . Hyperlipidemia   . Hypertension   . Morton's neuroma    2014  . Seasonal allergies    uses advair in May  . ST elevation myocardial infarction (STEMI) of anterior wall ([place]) 05/25/14  . Tobacco abuse, + cigars 05/27/2014    Past Surgical History:  Procedure Laterality Date  . CORONARY ANGIOPLASTY WITH STENT PLACEMENT  05/25/14   Resolute integrity 3.5 X 18 mm DES 3.75 mm  . Fx L forearm closed fx  as a child  . Hospital / asthma     multiple times as child  . LEFT HEART CATHETERIZATION WITH CORONARY ANGIOGRAM N/A 05/25/2014   Procedure: LEFT HEART CATHETERIZATION WITH CORONARY ANGIOGRAM;  Surgeon: Sinclair Grooms, MD;  Location: [facility] CATH LAB;  Service: Cardiovascular;  Laterality: N/A;  . PERCUTANEOUS CORONARY STENT INTERVENTION (PCI-S)  05/25/2014   Procedure: PERCUTANEOUS CORONARY STENT INTERVENTION (PCI-S);  Surgeon: Sinclair Grooms, MD;  Location: [facility] CATH LAB;  Service: Cardiovascular;;  PROX LAD     Current Medications: Outpatient Medications Prior to Visit  Medication Sig Dispense Refill  . acetaminophen (TYLENOL) 500 MG tablet  Take 500-1,000 mg by mouth daily as needed (pain).    . ADVAIR DISKUS 100-50 MCG/DOSE AEPB Inhale 1 puff into the lungs daily as needed (allergies).   0  . aspirin 81 MG chewable tablet Chew 1 tablet (81 mg total) by mouth daily.    . carvedilol (COREG) 3.125 MG tablet Take 1 tablet (3.125 mg total) by mouth 2 (two) times daily with a meal. 180 tablet 0  . Ketotifen Fumarate (ALAWAY OP) Place 1 drop into both eyes daily as needed (seasonal allergies).    . Multiple Vitamins-Minerals (ZINC PO) Take 1 tablet by mouth daily.    . rosuvastatin (CRESTOR) 20 MG tablet Take 1 tablet (20 mg total) by mouth daily at 6 PM. 90 tablet 0  . Syringe, Disposable, 3 ML MISC Use every 28 days with 22g 1 inch needle.  Use for testosterone injection.  Disp 25 syringes and 25 needles 25 each 2  . testosterone cypionate (DEPOTESTOSTERONE CYPIONATE) 200 MG/ML injection Inject 0.7 mLs (140 mg total) into the muscle every 14 (fourteen) days. 10 mL 0  . valsartan-hydrochlorothiazide (DIOVAN-HCT) 320-12.5 MG tablet Take 1 tablet by mouth daily. 90 tablet 0  . nitroGLYCERIN (NITROSTAT) 0.4 MG SL tablet Place 1 tablet (0.4 mg total) under the tongue every 5 (five) minutes as needed for chest pain. 25 tablet 3  . colchicine 0.6 MG tablet Take 1 tablet by mouth  daily as needed (Patient not taking: Reported on 09/05/2016) 90 tablet 0  .  sildenafil (REVATIO) 20 MG tablet Take 20-100 mg by mouth daily as needed.     No facility-administered medications prior to visit.      Allergies:   Claritin-d 12 hour [loratadine-pseudoephedrine er]; Kiwi extract; Penicillins; and Simvastatin   Social History   Social History  . Marital status: Married    Spouse name: N/A  . Number of children: 1  . Years of education: N/A   Occupational History  . [employer]     supervisor of special chemistries   Social History Main Topics  . Smoking status: Former Smoker    Types: Cigars    Quit date: 05/25/2014  . Smokeless tobacco: Never Used    . Alcohol use 1.2 oz/week    2 Cans of beer per week     Comment: beer  . Drug use: No  . Sexual activity: Yes   Other Topics Concern  . None   Social History Narrative   One son, 1 stepson   From [place]   Works for [employer].    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       Family History:  The patient's family history includes AAA (abdominal aortic aneurysm) in his father; Alcohol abuse in his brother; Dementia in his father; Hyperlipidemia in his sister; Hypertension in his father and sister; Stroke in his mother; Stroke (age of onset: 102) in his father.   ROS:   Please see the history of present illness.    Occasional palpitation and recently an upper respiratory illness with congestion. Decreased hearing both ears. Occasional lower extremity swelling and leg pain. Some difficulty with balance. All other systems reviewed and are negative.   PHYSICAL EXAM:   VS:  BP 140/84 (BP Location: Left Arm)   Pulse 71   Ht 5\' 7"  (1.702 m)   Wt 199 lb 9.6 oz (90.5 kg)   BMI 31.26 kg/m    GEN: Well nourished, well developed, in no acute distress  HEENT: normal  Neck: no JVD, carotid bruits, or masses Cardiac: RRR; no murmurs, rubs, or gallops,no edema  Respiratory:  clear to auscultation bilaterally, normal work of breathing GI: soft, nontender, nondistended, + BS MS: no deformity or atrophy  Skin: warm and dry, no rash Neuro:  Alert and Oriented x 3, Strength and sensation are intact Psych: euthymic mood, full affect  Wt Readings from Last 3 Encounters:  09/05/16 199 lb 9.6 oz (90.5 kg)  02/17/16 211 lb 4 oz (95.8 kg)  08/20/15 211 lb 1.9 oz (95.8 kg)      Studies/Labs Reviewed:   EKG:  EKG  Normal sinus rhythm, inferior T-wave abnormality, otherwise unremarkable. No change compared to prior.  Recent Labs: 02/15/2016: BUN 13; Creatinine, Ser 1.02; Potassium 4.9; Sodium 139 03/22/2016: ALT 39   Lipid Panel    Component Value Date/Time   CHOL 137  02/15/2016 0000   TRIG 140 02/15/2016 0000   HDL 46 02/15/2016 0000   CHOLHDL 3.0 02/15/2016 0000   CHOLHDL 3 07/28/2014 0903   VLDL 24.2 07/28/2014 0903   LDLCALC 63 02/15/2016 0000    Additional studies/ records that were reviewed today include:  None.    ASSESSMENT:    1. CAD in native artery   2. Essential hypertension   3. Other hyperlipidemia      PLAN:  In order of problems listed above:  1. Asymptomatic. Heavy physical activity without symptoms. Overall stable. 2. Monitors his blood pressure frequently. Blood pressure target  140/90 mmHg or less. 3. Last LDL 63 as noted above.  Clinical follow-up in one year. Encouraged LDL less than 70, blood pressures less than 140/90, and continued aerobic activity.    Medication Adjustments/Labs and Tests Ordered: Current medicines are reviewed at length with the patient today.  Concerns regarding medicines are outlined above.  Medication changes, Labs and Tests ordered today are listed in the Patient Instructions below. Patient Instructions  Medication Instructions:  None  Labwork: None  Testing/Procedures: None  Follow-Up: Your physician wants you to follow-up in: 1 year with Dr.Ryan Ogborn. You will receive a reminder letter in the mail two months in advance. If you don't receive a letter, please call our office to schedule the follow-up appointment.   Any Other Special Instructions Will Be Listed Below (If Applicable).  Target for your LDL (bad) cholesterol is 70.  Blood pressure target is 140/90 or less.     If you need a refill on your cardiac medications before your next appointment, please call your pharmacy.      Signed, Sinclair Grooms, MD  09/05/2016 9:36 AM    [facility] [place], [place], [place]  [address] Phone: [phone]; Fax: [phone]

## 2016-10-08 ENCOUNTER — Other Ambulatory Visit: Payer: Self-pay | Admitting: Interventional Cardiology

## 2016-12-13 ENCOUNTER — Ambulatory Visit (INDEPENDENT_AMBULATORY_CARE_PROVIDER_SITE_OTHER): Payer: Managed Care, Other (non HMO) | Admitting: Podiatry

## 2016-12-13 ENCOUNTER — Ambulatory Visit (INDEPENDENT_AMBULATORY_CARE_PROVIDER_SITE_OTHER): Payer: Managed Care, Other (non HMO)

## 2016-12-13 DIAGNOSIS — D3610 Benign neoplasm of peripheral nerves and autonomic nervous system, unspecified: Secondary | ICD-10-CM

## 2016-12-13 DIAGNOSIS — G5761 Lesion of plantar nerve, right lower limb: Secondary | ICD-10-CM

## 2016-12-15 NOTE — Progress Notes (Signed)
   HPI: Patient is a 56 year old male presenting today complaining of sharp pain and numbness of the right plantar forefoot just beneath the second toe that has been present for the past 2 months. He states he has seen Dr. Paulla Dolly in the past and received injections which provided significant relief. Running intensifies the pain. He is here for further evaluation and treatment.   Physical Exam: General: The patient is alert and oriented x3 in no acute distress.  Dermatology: Skin is warm, dry and supple bilateral lower extremities. Negative for open lesions or macerations.  Vascular: Palpable pedal pulses bilaterally. No edema or erythema noted. Capillary refill within normal limits.  Neurological: Epicritic and protective threshold grossly intact bilaterally.   Musculoskeletal Exam: Sharp pain with palpation of the right second interspace and lateral compression of the metatarsal heads consistent with neuroma. Positive Conley Canal sign with loadbearing of the forefoot.  Radiographic Exam:  Normal osseous mineralization. Joint spaces preserved. No fracture/dislocation/boney destruction.    Assessment: 1.  Morton's neuroma second interspace right foot   Plan of Care:  1. Patient was evaluated. X-rays reviewed. 2. Injection of 2 mL 4% dehydrated alcohol sclerosing agent into neuroma. 3. Neuroma pads dispensed. 4. Return to clinic in 2 weeks.   Edrick Kins, DPM [facility]  Dr. Edrick Kins, [address]                                        [place], [place] [address]                Office [phone]  Fax [phone]

## 2016-12-20 ENCOUNTER — Ambulatory Visit (INDEPENDENT_AMBULATORY_CARE_PROVIDER_SITE_OTHER): Payer: Managed Care, Other (non HMO) | Admitting: Podiatry

## 2016-12-20 ENCOUNTER — Encounter: Payer: Self-pay | Admitting: Podiatry

## 2016-12-20 DIAGNOSIS — G5761 Lesion of plantar nerve, right lower limb: Secondary | ICD-10-CM | POA: Diagnosis not present

## 2016-12-20 DIAGNOSIS — G5791 Unspecified mononeuropathy of right lower limb: Secondary | ICD-10-CM

## 2016-12-20 MED ORDER — METHYLPREDNISOLONE 4 MG PO TABS: ORAL_TABLET | ORAL | 0 refills | Status: DC

## 2016-12-22 NOTE — Progress Notes (Signed)
   HPI: Patient is a 56 year old male presenting for follow-up evaluation of a neuroma to the second interspace of the right foot. He states the pain has worsened since receiving injections last week. He is here for further evaluation and treatment.   Physical Exam: General: The patient is alert and oriented x3 in no acute distress.  Dermatology: Skin is warm, dry and supple bilateral lower extremities. Negative for open lesions or macerations.  Vascular: Palpable pedal pulses bilaterally. No edema or erythema noted. Capillary refill within normal limits.  Neurological: Epicritic and protective threshold grossly intact bilaterally.   Musculoskeletal Exam: Sharp pain with palpation of the right second interspace and lateral compression of the metatarsal heads consistent with neuroma. Positive Conley Canal sign with loadbearing of the forefoot.   Assessment: 1.  Morton's neuroma second interspace right foot   Plan of Care:  1. Patient was evaluated. 2. Injection of 0.5 mLs Celestone Soluspan injected into the second interspace right foot at the level place of the Morton's neuroma 3. Prescription for Medrol Dosepak given to patient. 4. Return to clinic in 4 weeks.   Edrick Kins, DPM [facility]  Dr. Edrick Kins, [place]                                        [place],  [address]                Office [phone]  Fax [phone]

## 2017-01-06 ENCOUNTER — Ambulatory Visit: Payer: Managed Care, Other (non HMO) | Admitting: Podiatry

## 2017-01-07 MED ORDER — BETAMETHASONE SOD PHOS & ACET 6 (3-3) MG/ML IJ SUSP: 3.0000 mg | Freq: Once | INTRAMUSCULAR | Status: DC

## 2017-03-12 ENCOUNTER — Other Ambulatory Visit: Payer: Self-pay | Admitting: Family Medicine

## 2017-03-13 NOTE — Telephone Encounter (Signed)
Electronic refill request. Colchicine Last office visit:   02/17/16 CPE Last Filled:   ?  Please advise.

## 2017-03-13 NOTE — Telephone Encounter (Signed)
Sent.  Needs CPE scheduled.  Thanks.  

## 2017-03-14 NOTE — Telephone Encounter (Signed)
Patient advised.

## 2017-03-27 ENCOUNTER — Telehealth: Payer: Self-pay | Admitting: Interventional Cardiology

## 2017-03-27 MED ORDER — CARVEDILOL 6.25 MG PO TABS: 6.2500 mg | ORAL_TABLET | Freq: Two times a day (BID) | ORAL | 3 refills | Status: DC

## 2017-03-27 NOTE — Telephone Encounter (Signed)
Pt states BP has been running higher for about a week.  BP this AM was 161/101, taken about an hour after medications.  Most recent BP's are as follows: 9/7- 159/100 9/8- 159/93 (before meds), 143/88, 158/85, 179/86, 163/85 9/9- 163/100 (before meds), 138/82, 157/93   **All BP's were after medications unless noted otherwise  Pt denies sx other than fatigue and just generally not feeling well.  Pt increased his Carvedilol on his own.  Has been taking Coreg 6.25mg  in AM and 3.125mg  in PM.  Also takes Valsartan/HCTZ 320/12.5mg  QD.  Advised pt I would send message to Dr. Tamala Julian for review and advisement.

## 2017-03-27 NOTE — Telephone Encounter (Signed)
New Message     Pt c/o BP issue: STAT if pt c/o blurred vision, one-sided weakness or slurred speech  1. What are your last 5 BP readings? 161/101   2. Are you having any other symptoms (ex. Dizziness, headache, blurred vision, passed out)? No symptoms , just doesn't feel good   3. What is your BP issue?  Thinks its higher than it should be

## 2017-03-27 NOTE — Telephone Encounter (Signed)
Hence gain weight? Is there any significant alcohol use?  Recommend increasing carvedilol to 6.25 mg twice a day. Continue to monitor blood pressure. If remains high we will bring him into the blood pressure clinic

## 2017-03-27 NOTE — Telephone Encounter (Signed)
Pt denies any weight gain or alcohol use.  Advised pt of recommendation to increase Carvedilol and monitor BP.  Pt will call if BP remains elevated.

## 2017-03-28 ENCOUNTER — Other Ambulatory Visit (INDEPENDENT_AMBULATORY_CARE_PROVIDER_SITE_OTHER): Payer: Managed Care, Other (non HMO)

## 2017-03-28 ENCOUNTER — Other Ambulatory Visit: Payer: Self-pay | Admitting: Family Medicine

## 2017-03-28 DIAGNOSIS — I10 Essential (primary) hypertension: Secondary | ICD-10-CM | POA: Diagnosis not present

## 2017-03-28 DIAGNOSIS — M109 Gout, unspecified: Secondary | ICD-10-CM

## 2017-03-28 NOTE — Addendum Note (Signed)
Addended by: Ellamae Sia on: 03/28/2017 07:54 AM   Modules accepted: Orders

## 2017-03-29 LAB — CBC WITH DIFFERENTIAL/PLATELET
Basophils Absolute: 0 x10E3/uL (ref 0.0–0.2)
Basos: 0 %
EOS (ABSOLUTE): 0.5 x10E3/uL — ABNORMAL HIGH (ref 0.0–0.4)
Eos: 8 %
Hematocrit: 50.4 % (ref 37.5–51.0)
Hemoglobin: 17.8 g/dL — ABNORMAL HIGH (ref 13.0–17.7)
Immature Grans (Abs): 0 x10E3/uL (ref 0.0–0.1)
Immature Granulocytes: 0 %
Lymphocytes Absolute: 1.4 x10E3/uL (ref 0.7–3.1)
Lymphs: 22 %
MCH: 32.7 pg (ref 26.6–33.0)
MCHC: 35.3 g/dL (ref 31.5–35.7)
MCV: 93 fL (ref 79–97)
Monocytes Absolute: 0.6 x10E3/uL (ref 0.1–0.9)
Monocytes: 10 %
Neutrophils Absolute: 3.9 x10E3/uL (ref 1.4–7.0)
Neutrophils: 60 %
Platelets: 216 x10E3/uL (ref 150–379)
RBC: 5.45 x10E6/uL (ref 4.14–5.80)
RDW: 14 % (ref 12.3–15.4)
WBC: 6.4 x10E3/uL (ref 3.4–10.8)

## 2017-03-29 LAB — LIPID PANEL
Chol/HDL Ratio: 2.7 ratio (ref 0.0–5.0)
Cholesterol, Total: 148 mg/dL (ref 100–199)
HDL: 54 mg/dL
LDL Calculated: 71 mg/dL (ref 0–99)
Triglycerides: 117 mg/dL (ref 0–149)
VLDL Cholesterol Cal: 23 mg/dL (ref 5–40)

## 2017-03-29 LAB — COMPREHENSIVE METABOLIC PANEL WITH GFR
ALT: 50 IU/L — ABNORMAL HIGH (ref 0–44)
AST: 30 IU/L (ref 0–40)
Albumin/Globulin Ratio: 2.2 (ref 1.2–2.2)
Albumin: 4.6 g/dL (ref 3.5–5.5)
Alkaline Phosphatase: 73 IU/L (ref 39–117)
BUN/Creatinine Ratio: 11 (ref 9–20)
BUN: 13 mg/dL (ref 6–24)
Bilirubin Total: 0.6 mg/dL (ref 0.0–1.2)
CO2: 26 mmol/L (ref 20–29)
Calcium: 9.7 mg/dL (ref 8.7–10.2)
Chloride: 94 mmol/L — ABNORMAL LOW (ref 96–106)
Creatinine, Ser: 1.16 mg/dL (ref 0.76–1.27)
GFR calc Af Amer: 81 mL/min/1.73
GFR calc non Af Amer: 70 mL/min/1.73
Globulin, Total: 2.1 g/dL (ref 1.5–4.5)
Glucose: 96 mg/dL (ref 65–99)
Potassium: 4.7 mmol/L (ref 3.5–5.2)
Sodium: 139 mmol/L (ref 134–144)
Total Protein: 6.7 g/dL (ref 6.0–8.5)

## 2017-03-29 LAB — URIC ACID: Uric Acid: 7.2 mg/dL (ref 3.7–8.6)

## 2017-03-31 ENCOUNTER — Encounter: Payer: Self-pay | Admitting: Family Medicine

## 2017-03-31 ENCOUNTER — Ambulatory Visit (INDEPENDENT_AMBULATORY_CARE_PROVIDER_SITE_OTHER): Payer: Managed Care, Other (non HMO) | Admitting: Family Medicine

## 2017-03-31 VITALS — BP 136/90 | HR 77 | Temp 98.6°F | Ht 70.0 in | Wt 208.0 lb

## 2017-03-31 DIAGNOSIS — Z0000 Encounter for general adult medical examination without abnormal findings: Secondary | ICD-10-CM

## 2017-03-31 DIAGNOSIS — E785 Hyperlipidemia, unspecified: Secondary | ICD-10-CM

## 2017-03-31 DIAGNOSIS — Z659 Problem related to unspecified psychosocial circumstances: Secondary | ICD-10-CM

## 2017-03-31 DIAGNOSIS — Z7189 Other specified counseling: Secondary | ICD-10-CM

## 2017-03-31 DIAGNOSIS — I10 Essential (primary) hypertension: Secondary | ICD-10-CM

## 2017-03-31 DIAGNOSIS — Z1211 Encounter for screening for malignant neoplasm of colon: Secondary | ICD-10-CM

## 2017-03-31 NOTE — Progress Notes (Signed)
CPE- See plan.  Routine anticipatory guidance given to patient.  See health maintenance.  The possibility exists that previously documented standard health maintenance information may have been brought forward from a previous encounter into this note.  If needed, that same information has been updated to reflect the current situation based on today's encounter.    Tetanus 2011 Flu done yearly at pharmacy.   Shingles d/w pt.   PNA not due D/w patient [id number] for colon cancer screening, including IFOB vs. colonoscopy. Risks and benefits of both were discussed and patient voiced understanding. Pt elects for: IFOB.  PSA per urology.  Living will d/w pt. Wife is designated if he is incapacitated.  Diet and exercise d/w pt.  See below about exercise, he is working to get his distance back up.  Diet is good.   HIV prev neg 2002.     Hypertension:    Using medication without problems or lightheadedness: no  Chest pain with exertion:no Edema:no Short of breath: deconditioning may contribute.   BB was recently increased. He has been able to tolerate the increase in med. No NTG use.   He isn't on valsartan that was involved in the recall.  D/w pt- he checked on that.    He had a job change and that has been stressful.  He is trying to work through the situation.  He has had more indigestion in the meantime, improved with pepto bismol.  He doesn't have exertional sx but he isn't exercising as much.  He had prev lost weight with diet and exercise prior to the job change.  He is motivated for improvement.  His mood is affected by his job situation.  No SI/HI.  He has a meeting planned at work for next week- that may help him address his concerns.  He can use benadryl for sleep in the meantime and he'll update me after the meeting.  D/w pt.  He agrees.    PMH and SH reviewed  Meds, vitals, and allergies reviewed.   ROS: Per HPI.  Unless specifically indicated otherwise in HPI, the patient  denies:  General: fever. Eyes: acute vision changes ENT: sore throat Cardiovascular: chest pain Respiratory: SOB GI: vomiting GU: dysuria Musculoskeletal: acute back pain Derm: acute rash Neuro: acute motor dysfunction Psych: worsening mood Endocrine: polydipsia Heme: bleeding Allergy: hayfever  GEN: nad, alert and oriented HEENT: mucous membranes moist NECK: supple w/o LA CV: rrr. PULM: ctab, no inc wob ABD: soft, +bs EXT: no edema SKIN: no acute rash

## 2017-03-31 NOTE — Patient Instructions (Addendum)
Go to the lab on the way out.  We'll contact you with your lab report. Take care.  Glad to see you.  I'll update your other docs in the meantime.  Let me know how the meeting goes next week.   Try the whole pill of benadryl at night for sleep and if that doesn't work then let me know.

## 2017-04-02 DIAGNOSIS — Z659 Problem related to unspecified psychosocial circumstances: Secondary | ICD-10-CM | POA: Insufficient documentation

## 2017-04-02 NOTE — Assessment & Plan Note (Signed)
D/w pt about lipids, meds, diet and exercise.  His work situation is the biggest obstacle at this point.  He'll address that and update me as needed.  He agrees.

## 2017-04-02 NOTE — Assessment & Plan Note (Signed)
Tetanus 2011 Flu done yearly at pharmacy.   Shingles d/w pt.   PNA not due D/w patient [id number] for colon cancer screening, including IFOB vs. colonoscopy. Risks and benefits of both were discussed and patient voiced understanding. Pt elects for: IFOB.  PSA per urology.  Living will d/w pt. Wife is designated if he is incapacitated.  Diet and exercise d/w pt.  See below about exercise, he is working to get his distance back up.  Diet is good.   HIV prev neg 2002.

## 2017-04-02 NOTE — Assessment & Plan Note (Signed)
Living will d/w pt.  Wife is designated if he is incapacitated.   

## 2017-04-02 NOTE — Assessment & Plan Note (Signed)
BB was recently increased. He has been able to tolerate the increase in med. No NTG use.   He isn't on valsartan that was involved in the recall.  D/w pt- he checked on that.   BP is reasonable today.

## 2017-04-02 NOTE — Assessment & Plan Note (Signed)
He had a job change and that has been stressful.  He is trying to work through the situation.  He has had more indigestion in the meantime, improved with pepto bismol.  He doesn't have exertional sx but he isn't exercising as much.  He had prev lost weight with diet and exercise prior to the job change.  He is motivated for improvement.  His mood is affected by his job situation.  No SI/HI.  He has a meeting planned at work for next week- that may help him address his concerns.  He can use benadryl for sleep in the meantime and he'll update me after the meeting.  D/w pt.  He agrees.

## 2017-04-06 ENCOUNTER — Encounter: Payer: Self-pay | Admitting: Family Medicine

## 2017-04-06 LAB — LAB REPORT - SCANNED: Prostate Specific Ag, Serum: 0.92

## 2017-04-14 NOTE — Addendum Note (Signed)
Addended by: Ellamae Sia on: 04/14/2017 04:26 PM   Modules accepted: Orders

## 2017-04-14 NOTE — Addendum Note (Signed)
Addended by: Ellamae Sia on: 04/14/2017 04:14 PM   Modules accepted: Orders

## 2017-04-27 ENCOUNTER — Encounter: Payer: Self-pay | Admitting: Family Medicine

## 2017-04-27 ENCOUNTER — Telehealth: Payer: Self-pay | Admitting: Family Medicine

## 2017-04-27 NOTE — Telephone Encounter (Signed)
Terri- please talk to me about this patient's stool test.  See mychart message pasted below.  Thanks.   ----------------------------- Good morning, I am following up on occult blood test you asked me to collect during my annual physical. The first test kit given me was the wrong one (I only submitted 1 of 3 samples- this was not a [facility] test kit), I have been sent the correct [facility] kit, but after collecting the sample I have realized I don't have the requisition to put in with the sample. So it seems I need another [facility] kit FOB kit sent to me with the test requisition if possible?    Thanks for your help. Good news is my BP is now 136/76 since changing my job.    [name]

## 2017-04-27 NOTE — Addendum Note (Signed)
Addended by: Ellamae Sia on: 04/27/2017 12:39 PM   Modules accepted: Orders

## 2017-05-02 ENCOUNTER — Encounter: Payer: Self-pay | Admitting: Podiatry

## 2017-05-02 ENCOUNTER — Encounter: Payer: Self-pay | Admitting: Family Medicine

## 2017-05-02 ENCOUNTER — Ambulatory Visit (INDEPENDENT_AMBULATORY_CARE_PROVIDER_SITE_OTHER): Payer: Managed Care, Other (non HMO) | Admitting: Podiatry

## 2017-05-02 DIAGNOSIS — M7751 Other enthesopathy of right foot: Secondary | ICD-10-CM

## 2017-05-04 NOTE — Progress Notes (Signed)
   HPI: 56 year old male presenting today with a complaint of a flareup of a neuroma of the right foot beginning one month ago. He reports associated pain rating it at 6/10. Walking and running increased the pain. He denies alleviating factors. He has not done anything to treat the symptoms. He is here for further evaluation and treatment.   Past Medical History:  Diagnosis Date  . Achilles tendon injury    right  . CAD S/P percutaneous coronary angioplasty 05/25/2014  . ED (erectile dysfunction)   . Elevated glucose   . GERD (gastroesophageal reflux disease)   . Gout   . Hyperlipidemia   . Hypertension   . Morton's neuroma    2014  . Seasonal allergies    uses advair in May  . ST elevation myocardial infarction (STEMI) of anterior wall ([place]) 05/25/14  . Tobacco abuse, + cigars 05/27/2014     Physical Exam: General: The patient is alert and oriented x3 in no acute distress.  Dermatology: Skin is warm, dry and supple bilateral lower extremities. Negative for open lesions or macerations.  Vascular: Palpable pedal pulses bilaterally. No edema or erythema noted. Capillary refill within normal limits.  Neurological: Epicritic and protective threshold grossly intact bilaterally.   Musculoskeletal Exam: Pain with palpation to the second MPJ of the right foot. Range of motion within normal limits to all pedal and ankle joints bilateral. Muscle strength 5/5 in all groups bilateral.   Assessment: - Second MPJ capsulitis right   Plan of Care:  - Patient evaluated. - Injection of 0.5 mLs Celestone Soluspan injected into the second MPJ of the right foot. - Appointment with Liliane Channel for custom molded orthotics. - Return to clinic in 4 weeks.   Edrick Kins, DPM [facility]  Dr. Edrick Kins, DPM    [address], [place] [address]                Office [phone]  Fax [phone]

## 2017-05-11 MED ORDER — BETAMETHASONE SOD PHOS & ACET 6 (3-3) MG/ML IJ SUSP: 3.0000 mg | Freq: Once | INTRAMUSCULAR | Status: DC

## 2017-05-24 ENCOUNTER — Ambulatory Visit (INDEPENDENT_AMBULATORY_CARE_PROVIDER_SITE_OTHER): Payer: Managed Care, Other (non HMO) | Admitting: Orthotics

## 2017-05-24 DIAGNOSIS — G5761 Lesion of plantar nerve, right lower limb: Secondary | ICD-10-CM

## 2017-05-24 DIAGNOSIS — G5791 Unspecified mononeuropathy of right lower limb: Secondary | ICD-10-CM

## 2017-05-24 LAB — FECAL OCCULT BLOOD, IMMUNOCHEMICAL: Fecal Occult Bld: NEGATIVE

## 2017-05-24 NOTE — Progress Notes (Signed)
Patient presents today for f/o per Dr. Amalia Hailey.  Patient has #3 capsulitis, and needs offloading.  Plan on [employer] to fab device for athletic shoe.

## 2017-05-30 ENCOUNTER — Ambulatory Visit: Payer: Managed Care, Other (non HMO) | Admitting: Podiatry

## 2017-05-30 ENCOUNTER — Encounter: Payer: Self-pay | Admitting: Podiatry

## 2017-05-30 DIAGNOSIS — M7751 Other enthesopathy of right foot: Secondary | ICD-10-CM | POA: Diagnosis not present

## 2017-05-30 MED ORDER — NONFORMULARY OR COMPOUNDED ITEM: 2 refills | Status: DC

## 2017-06-02 NOTE — Progress Notes (Signed)
   HPI: 56 year old male presenting today for follow up evaluation of 2nd MPJ capsulitis of the right foot. He states the injection he previously received helped alleviate the pain for about two weeks. He has been scanned for orthotics by Liliane Channel but they have not arrived yet. He denies any new complaints at this time. He is here for further evaluation and treatment.   Past Medical History:  Diagnosis Date  . Achilles tendon injury    right  . CAD S/P percutaneous coronary angioplasty 05/25/2014  . ED (erectile dysfunction)   . Elevated glucose   . GERD (gastroesophageal reflux disease)   . Gout   . Hyperlipidemia   . Hypertension   . Morton's neuroma    2014  . Seasonal allergies    uses advair in May  . ST elevation myocardial infarction (STEMI) of anterior wall ([place]) 05/25/14  . Tobacco abuse, + cigars 05/27/2014     Physical Exam: General: The patient is alert and oriented x3 in no acute distress.  Dermatology: Skin is warm, dry and supple bilateral lower extremities. Negative for open lesions or macerations.  Vascular: Palpable pedal pulses bilaterally. No edema or erythema noted. Capillary refill within normal limits.  Neurological: Epicritic and protective threshold grossly intact bilaterally.   Musculoskeletal Exam: Pain with palpation to the second MPJ of the right foot. Range of motion within normal limits to all pedal and ankle joints bilateral. Muscle strength 5/5 in all groups bilateral.   Assessment: - Second MPJ capsulitis right   Plan of Care:  - Patient evaluated. - Injection of 0.5 mLs Celestone Soluspan injected into the second MPJ of the right foot. - Prescription for antiinflammatory pain cream to be dispensed from [facility]. - Pt has been molded for orthotics. - Return to clinic when necessary.    Edrick Kins, DPM [facility]  Dr. Edrick Kins, DPM    [address],  [place] [address]                Office [phone]  Fax [phone]

## 2017-06-20 ENCOUNTER — Ambulatory Visit (INDEPENDENT_AMBULATORY_CARE_PROVIDER_SITE_OTHER): Payer: Managed Care, Other (non HMO) | Admitting: Orthotics

## 2017-06-20 DIAGNOSIS — G5761 Lesion of plantar nerve, right lower limb: Secondary | ICD-10-CM

## 2017-06-20 NOTE — Progress Notes (Signed)
Patient came in today to pick up custom made foot orthotics.  The goals were accomplished and the patient reported no dissatisfaction with said orthotics.  Patient was advised of breakin period and how to report any issues. Plan vendor

## 2017-07-20 ENCOUNTER — Telehealth: Payer: Self-pay | Admitting: Interventional Cardiology

## 2017-07-20 NOTE — Telephone Encounter (Signed)
Pt states he saw on the news where more Valsartan NDC #s were added to the recall.  Pt states he looked it up and his particular [place] # was included.  Advised I would reach out to his pharmacy to see if this was correct or if they have another supplier that they could send out prior to changing medication.  Spoke with Cecille Rubin, Pharmacist at [facility], who attempted to look and see if pt's medication was included in the most recent recall.  She can see that manufacturer was but couldn't look up particular lot number while we were on the phone.  Cecille Rubin states she will look into this and call me back today to let me know if pt's medication is included and needs to be changed.  She states that if it is not included she will not call back and pt can continue with current supply.

## 2017-07-20 NOTE — Telephone Encounter (Signed)
New Message  Pt c/o medication issue:  1. Name of Medication: Valsartan   2. How are you currently taking this medication (dosage and times per day)? 320- 12.5mg    3. Are you having a reaction (difficulty breathing--STAT)? no  4. What is your medication issue? recall

## 2017-07-25 NOTE — Telephone Encounter (Signed)
Spoke with RPH at [facility].  He states pt's lot number was recalled from the prescription that was sent out back in October.  They mailed a new prescription out yesterday that was not part of the recall.  Spoke with pt and made him aware.  Pt verbalized understanding and was appreciative for call.

## 2017-07-30 IMAGING — NM NM MISC PROCEDURE
6 series · 36 of 36 positions shown · non-contrast
Comparison: none

[Series 1: stress-sum-em · 6.40mm/px · 6 of 64 frames shown]
[frame 6/64]
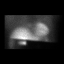
[frame 16/64]
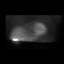
[frame 27/64]
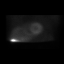
[frame 38/64]
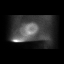
[frame 48/64]
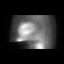
[frame 59/64]
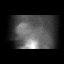

[Series 1: stress-gsp · 6.40mm/px · 6 of 512 frames shown]
[frame 43/512]
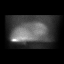
[frame 128/512]
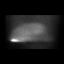
[frame 214/512]
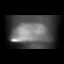
[frame 299/512]
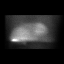
[frame 384/512]
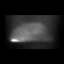
[frame 470/512]
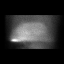

[Series 1: wbr_r-card_st rest · 6.4mm · 6.40mm/px · 6 of 21 frames shown]
[frame 2/21]
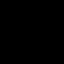
[frame 6/21]
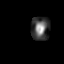
[frame 9/21]
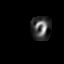
[frame 13/21]
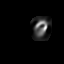
[frame 16/21]
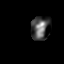
[frame 20/21]
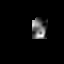

[Series 1: wbr_s-card_st stress-gsp · 6.4mm · 6.40mm/px · 6 of 178 frames shown]
[frame 15/178]
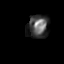
[frame 45/178]
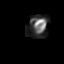
[frame 75/178]
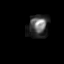
[frame 104/178]
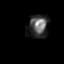
[frame 134/178]
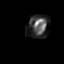
[frame 164/178]
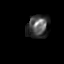

[Series 1: wbr_s-card_st stress-sum-em · 6.4mm · 6.40mm/px · 6 of 21 frames shown]
[frame 2/21]
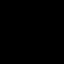
[frame 6/21]
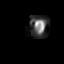
[frame 9/21]
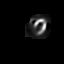
[frame 13/21]
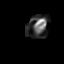
[frame 16/21]
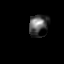
[frame 20/21]
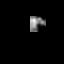

[Series 1: rest · 6.40mm/px · 6 of 64 frames shown]
[frame 6/64]
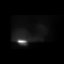
[frame 16/64]
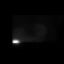
[frame 27/64]
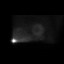
[frame 38/64]
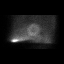
[frame 48/64]
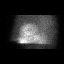
[frame 59/64]
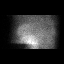

[36 of 36 positions shown; findings below may reference images not displayed]

Canned report from images found in remote index.

Refer to host system for actual result text.

## 2017-09-05 NOTE — Progress Notes (Signed)
Cardiology Office Note    Date:  09/06/2017   ID:  Stephen [name], DOB 1961/03/15, MRN [id number]  PCP:  Stephen Ghent, MD  Cardiologist: Stephen Grooms, MD   Chief Complaint  Patient presents with  . Coronary Artery Disease    History of Present Illness:  Stephen [name] is a 57 y.o. male who presents for anterior ST elevation myocardial infarction treated with LAD drug-eluting stents 05/2014, hypertension, hyperlipidemia, prior tobacco abuse, erectile dysfunction,   Stephen [name] is doing well.  He has not been able to exercise by running, which has been his favorite because of orthopedic issues.  He bought a Nurse, adult and is now exercising at an increased level 30-45 minutes/day without dyspnea, chest discomfort, leg fatigue or pain.  He has erectile dysfunction for which he uses sildenafil.  He has not needed to use nitroglycerin.   Past Medical History:  Diagnosis Date  . Achilles tendon injury    right  . CAD S/P percutaneous coronary angioplasty 05/25/2014  . ED (erectile dysfunction)   . Elevated glucose   . GERD (gastroesophageal reflux disease)   . Gout   . Hyperlipidemia   . Hypertension   . Morton's neuroma    2014  . Seasonal allergies    uses advair in May  . ST elevation myocardial infarction (STEMI) of anterior wall ([place]) 05/25/14  . Tobacco abuse, + cigars 05/27/2014    Past Surgical History:  Procedure Laterality Date  . CORONARY ANGIOPLASTY WITH STENT PLACEMENT  05/25/14   Resolute integrity 3.5 X 18 mm DES 3.75 mm  . Fx L forearm closed fx  as a child  . Hospital / asthma     multiple times as child  . LEFT HEART CATHETERIZATION WITH CORONARY ANGIOGRAM N/A 05/25/2014   Procedure: LEFT HEART CATHETERIZATION WITH CORONARY ANGIOGRAM;  Surgeon: Stephen Grooms, MD;  Location: [facility] CATH LAB;  Service: Cardiovascular;  Laterality: N/A;  . PERCUTANEOUS CORONARY STENT INTERVENTION (PCI-S)  05/25/2014   Procedure: PERCUTANEOUS CORONARY STENT  INTERVENTION (PCI-S);  Surgeon: Stephen Grooms, MD;  Location: [facility] CATH LAB;  Service: Cardiovascular;;  PROX LAD     Current Medications: Outpatient Medications Prior to Visit  Medication Sig Dispense Refill  . acetaminophen (TYLENOL) 500 MG tablet Take 500-1,000 mg by mouth daily as needed (pain).    . ADVAIR DISKUS 100-50 MCG/DOSE AEPB Inhale 1 puff into the lungs daily as needed (allergies).   0  . aspirin 81 MG chewable tablet Chew 1 tablet (81 mg total) by mouth daily.    . colchicine 0.6 MG tablet Take 0.6 mg by mouth daily as needed (gout).    . Ketotifen Fumarate (ALAWAY OP) Place 1 drop into both eyes daily as needed (seasonal allergies).    . Multiple Vitamins-Minerals (ZINC PO) Take 1 tablet by mouth daily.    . nitroGLYCERIN (NITROSTAT) 0.4 MG SL tablet Place 1 tablet (0.4 mg total) under the tongue every 5 (five) minutes as needed for chest pain. 25 tablet 3  . NONFORMULARY OR COMPOUNDED ITEM See pharmacy note 120 each 2  . rosuvastatin (CRESTOR) 20 MG tablet TAKE 1 TABLET BY MOUTH  DAILY AT 6 PM. 90 tablet 3  . sildenafil (REVATIO) 20 MG tablet Take 20 mg to 100 mg by mouth daily as needed for erectile dysfunction.    . Syringe, Disposable, 3 ML MISC Use every 28 days with 22g 1 inch needle.  Use for testosterone injection.  Disp 25 syringes  and 25 needles 25 each 2  . testosterone cypionate (DEPOTESTOSTERONE CYPIONATE) 200 MG/ML injection Inject 0.7 mLs (140 mg total) into the muscle every 14 (fourteen) days. 10 mL 0  . valsartan-hydrochlorothiazide (DIOVAN-HCT) 320-12.5 MG tablet Take 1 tablet by mouth daily. 90 tablet 3  . carvedilol (COREG) 6.25 MG tablet Take 1 tablet (6.25 mg total) by mouth 2 (two) times daily. 180 tablet 3  . colchicine 0.6 MG tablet TAKE 1 TABLET BY MOUTH  DAILY AS NEEDED (Patient not taking: Reported on 09/06/2017) 90 tablet 0  . methylPREDNISolone (MEDROL) 4 MG tablet Take as directed (Patient not taking: Reported on 09/06/2017) 21 tablet 0    Facility-Administered Medications Prior to Visit  Medication Dose Route Frequency Provider Last Rate Last Dose  . betamethasone acetate-betamethasone sodium phosphate (CELESTONE) injection 3 mg  3 mg Intramuscular Once Stephen [name] M, DPM      . betamethasone acetate-betamethasone sodium phosphate (CELESTONE) injection 3 mg  3 mg Intramuscular Once Stephen [name], DPM         Allergies:   Claritin-d 12 hour [loratadine-pseudoephedrine er]; Kiwi extract; Penicillins; and Simvastatin   Social History   Socioeconomic History  . Marital status: Married    Spouse name: None  . Number of children: 1  . Years of education: None  . Highest education level: None  Social Needs  . Financial resource strain: None  . Food insecurity - worry: None  . Food insecurity - inability: None  . Transportation needs - medical: None  . Transportation needs - non-medical: None  Occupational History  . Occupation: [employer]    CommentSecretary/administrator of special chemistries  Tobacco Use  . Smoking status: Former Smoker    Types: Cigars    Last attempt to quit: 05/25/2014    Years since quitting: 3.2  . Smokeless tobacco: Never Used  Substance and Sexual Activity  . Alcohol use: Yes    Alcohol/week: 1.2 oz    Types: 2 Cans of beer per week    Comment: beer  . Drug use: No  . Sexual activity: Yes  Other Topics Concern  . None  Social History Narrative   One son, 1 stepson   Married 2001   From [place]   Works for [employer].    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      Family History:  The patient's family history includes AAA (abdominal aortic aneurysm) in his father; Alcohol abuse in his brother; Dementia in his father; Hyperlipidemia in his sister; Hypertension in his father and sister; Stroke in his mother; Stroke (age of onset: 69) in his father.   ROS:   Please see the history of present illness.    Chronic insomnia clinical issue.  Significant improvement since the  exercise.  Chronic back pain.  Not using sleep aids.  Discouraged by weight gain despite increasing exercise. All other systems reviewed and are negative.   PHYSICAL EXAM:   VS:  BP 132/88   Pulse 62   Ht 5\' 7"  (1.702 m)   Wt 212 lb (96.2 kg)   BMI 33.20 kg/m   He is overweight. GEN: Well nourished, well developed, in no acute distress  HEENT: normal  Neck: no JVD, carotid bruits, or masses Cardiac: RRR; no murmurs, rubs, or gallops,no edema  Respiratory:  clear to auscultation bilaterally, normal work of breathing GI: soft, nontender, nondistended, + BS MS: no deformity or atrophy  Skin: warm and dry, no rash Neuro:  Alert and Oriented x 3, Strength and sensation are intact Psych: euthymic mood, full affect  Wt Readings from Last 3 Encounters:  09/06/17 212 lb (96.2 kg)  03/31/17 208 lb (94.3 kg)  09/05/16 199 lb 9.6 oz (90.5 kg)      Studies/Labs Reviewed:   EKG:  EKG normal sinus rhythm with inferior T wave abnormality.  First-degree AV block is also noted.  Otherwise no significant abnormality is noted.  Recent Labs: 03/28/2017: ALT 50; BUN 13; Creatinine, Ser 1.16; Hemoglobin 17.8; Platelets 216; Potassium 4.7; Sodium 139   Lipid Panel    Component Value Date/Time   CHOL 148 03/28/2017 0758   TRIG 117 03/28/2017 0758   HDL 54 03/28/2017 0758   CHOLHDL 2.7 03/28/2017 0758   CHOLHDL 3 07/28/2014 0903   VLDL 24.2 07/28/2014 0903   LDLCALC 71 03/28/2017 0758    Additional studies/ records that were reviewed today include:  LDL was 70 18 March 2017 with a total of 148.    ASSESSMENT:    1. CAD in native artery   2. Essential hypertension   3. Hyperlipidemia with target LDL less than 70      PLAN:  In order of problems listed above:  1. Stable with history of LAD stent in the setting of acute anterior myocardial infarction.  No current symptoms.  No indication for functional testing given absence of symptoms activity. 2. Blood pressure control.   Patient indicated that he has decreased carvedilol to 3.125 mg twice daily rather than the 6.25 mg twice daily dose and I ordered.  He has been monitoring his blood pressures at home and has generally gotten recordings less than 140/90 mmHg and on average around 130/80. 3. Excellent lipid panel as noted above.  Is being followed by primary care.  Encourage physical activity as he is doing.  Decrease carbohydrates in diet.  Increase vegetables and fiber.  Call with any complaints.  No role for function testing at this time.    Medication Adjustments/Labs and Tests Ordered: Current medicines are reviewed at length with the patient today.  Concerns regarding medicines are outlined above.  Medication changes, Labs and Tests ordered today are listed in the Patient Instructions below. Patient Instructions  Medication Instructions:  1) A new prescription for Carvedilol 3.125mg  twice daily has been sent to your pharmacy  Labwork: None  Testing/Procedures: None  Follow-Up: Your physician wants you to follow-up in: 1 year with Dr. Tamala Julian.  You will receive a reminder letter in the mail two months in advance. If you don't receive a letter, please call our office to schedule the follow-up appointment.   Any Other Special Instructions Will Be Listed Below (If Applicable).     If you need a refill on your cardiac medications before your next appointment, please call your pharmacy.      Signed, Stephen Grooms, MD  09/06/2017 9:24 AM    [facility] [address], [place], [place]  [address] Phone: [phone]; Fax: [phone]

## 2017-09-06 ENCOUNTER — Ambulatory Visit: Payer: Managed Care, Other (non HMO) | Admitting: Interventional Cardiology

## 2017-09-06 ENCOUNTER — Encounter: Payer: Self-pay | Admitting: Interventional Cardiology

## 2017-09-06 VITALS — BP 132/88 | HR 62 | Ht 67.0 in | Wt 212.0 lb

## 2017-09-06 DIAGNOSIS — I2510 Atherosclerotic heart disease of native coronary artery without angina pectoris: Secondary | ICD-10-CM

## 2017-09-06 DIAGNOSIS — E785 Hyperlipidemia, unspecified: Secondary | ICD-10-CM | POA: Diagnosis not present

## 2017-09-06 DIAGNOSIS — I10 Essential (primary) hypertension: Secondary | ICD-10-CM | POA: Diagnosis not present

## 2017-09-06 MED ORDER — CARVEDILOL 3.125 MG PO TABS: 3.1250 mg | ORAL_TABLET | Freq: Two times a day (BID) | ORAL | 3 refills | Status: DC

## 2017-09-06 NOTE — Patient Instructions (Signed)
Medication Instructions:  1) A new prescription for Carvedilol 3.125mg  twice daily has been sent to your pharmacy  Labwork: None  Testing/Procedures: None  Follow-Up: Your physician wants you to follow-up in: 1 year with Dr. Tamala Julian.  You will receive a reminder letter in the mail two months in advance. If you don't receive a letter, please call our office to schedule the follow-up appointment.   Any Other Special Instructions Will Be Listed Below (If Applicable).     If you need a refill on your cardiac medications before your next appointment, please call your pharmacy.

## 2017-09-12 ENCOUNTER — Encounter: Payer: Self-pay | Admitting: Interventional Cardiology

## 2017-10-28 ENCOUNTER — Other Ambulatory Visit: Payer: Self-pay | Admitting: Interventional Cardiology

## 2018-02-14 ENCOUNTER — Encounter: Payer: Self-pay | Admitting: Family Medicine

## 2018-04-02 ENCOUNTER — Other Ambulatory Visit: Payer: Self-pay | Admitting: Family Medicine

## 2018-04-02 DIAGNOSIS — I10 Essential (primary) hypertension: Secondary | ICD-10-CM

## 2018-04-02 DIAGNOSIS — M109 Gout, unspecified: Secondary | ICD-10-CM

## 2018-04-05 ENCOUNTER — Other Ambulatory Visit (INDEPENDENT_AMBULATORY_CARE_PROVIDER_SITE_OTHER): Payer: Managed Care, Other (non HMO)

## 2018-04-05 DIAGNOSIS — I10 Essential (primary) hypertension: Secondary | ICD-10-CM

## 2018-04-05 DIAGNOSIS — M109 Gout, unspecified: Secondary | ICD-10-CM

## 2018-04-05 NOTE — Addendum Note (Signed)
Addended by: Ellamae Sia on: 04/05/2018 08:20 AM   Modules accepted: Orders

## 2018-04-06 LAB — CBC WITH DIFFERENTIAL/PLATELET
Basophils Absolute: 0 10*3/uL (ref 0.0–0.2)
Basos: 1 %
EOS (ABSOLUTE): 0.3 10*3/uL (ref 0.0–0.4)
Eos: 5 %
Hematocrit: 48.4 % (ref 37.5–51.0)
Hemoglobin: 16.7 g/dL (ref 13.0–17.7)
Immature Grans (Abs): 0 10*3/uL (ref 0.0–0.1)
Immature Granulocytes: 1 %
Lymphocytes Absolute: 1.3 10*3/uL (ref 0.7–3.1)
Lymphs: 20 %
MCH: 31.6 pg (ref 26.6–33.0)
MCHC: 34.5 g/dL (ref 31.5–35.7)
MCV: 92 fL (ref 79–97)
Monocytes Absolute: 0.5 10*3/uL (ref 0.1–0.9)
Monocytes: 8 %
Neutrophils Absolute: 4.4 10*3/uL (ref 1.4–7.0)
Neutrophils: 65 %
Platelets: 263 10*3/uL (ref 150–450)
RBC: 5.29 x10E6/uL (ref 4.14–5.80)
RDW: 13.2 % (ref 12.3–15.4)
WBC: 6.6 10*3/uL (ref 3.4–10.8)

## 2018-04-06 LAB — LIPID PANEL
Chol/HDL Ratio: 2.6 ratio (ref 0.0–5.0)
Cholesterol, Total: 120 mg/dL (ref 100–199)
HDL: 46 mg/dL (ref >39)
LDL Calculated: 56 mg/dL (ref 0–99)
Triglycerides: 89 mg/dL (ref 0–149)
VLDL Cholesterol Cal: 18 mg/dL (ref 5–40)

## 2018-04-06 LAB — COMPREHENSIVE METABOLIC PANEL
ALT: 30 IU/L (ref 0–44)
AST: 22 IU/L (ref 0–40)
Albumin/Globulin Ratio: 1.6 (ref 1.2–2.2)
Albumin: 4.3 g/dL (ref 3.5–5.5)
Alkaline Phosphatase: 78 IU/L (ref 39–117)
BUN/Creatinine Ratio: 13 (ref 9–20)
BUN: 13 mg/dL (ref 6–24)
Bilirubin Total: 0.4 mg/dL (ref 0.0–1.2)
CO2: 26 mmol/L (ref 20–29)
Calcium: 9.3 mg/dL (ref 8.7–10.2)
Chloride: 102 mmol/L (ref 96–106)
Creatinine, Ser: 1.03 mg/dL (ref 0.76–1.27)
GFR calc Af Amer: 93 mL/min/{1.73_m2} (ref >59)
GFR calc non Af Amer: 81 mL/min/{1.73_m2} (ref >59)
Globulin, Total: 2.7 g/dL (ref 1.5–4.5)
Glucose: 91 mg/dL (ref 65–99)
Potassium: 4.7 mmol/L (ref 3.5–5.2)
Sodium: 142 mmol/L (ref 134–144)
Total Protein: 7 g/dL (ref 6.0–8.5)

## 2018-04-06 LAB — URIC ACID: Uric Acid: 6.4 mg/dL (ref 3.7–8.6)

## 2018-04-09 ENCOUNTER — Encounter: Payer: Self-pay | Admitting: Family Medicine

## 2018-04-09 ENCOUNTER — Encounter: Payer: Managed Care, Other (non HMO) | Admitting: Family Medicine

## 2018-04-09 ENCOUNTER — Ambulatory Visit (INDEPENDENT_AMBULATORY_CARE_PROVIDER_SITE_OTHER): Payer: Managed Care, Other (non HMO) | Admitting: Family Medicine

## 2018-04-09 VITALS — BP 148/80 | HR 60 | Temp 98.6°F | Ht 67.0 in | Wt 214.0 lb

## 2018-04-09 DIAGNOSIS — E291 Testicular hypofunction: Secondary | ICD-10-CM

## 2018-04-09 DIAGNOSIS — Z0000 Encounter for general adult medical examination without abnormal findings: Secondary | ICD-10-CM | POA: Diagnosis not present

## 2018-04-09 DIAGNOSIS — I10 Essential (primary) hypertension: Secondary | ICD-10-CM

## 2018-04-09 DIAGNOSIS — Z7189 Other specified counseling: Secondary | ICD-10-CM

## 2018-04-09 DIAGNOSIS — E785 Hyperlipidemia, unspecified: Secondary | ICD-10-CM

## 2018-04-09 DIAGNOSIS — Z1211 Encounter for screening for malignant neoplasm of colon: Secondary | ICD-10-CM

## 2018-04-09 DIAGNOSIS — M109 Gout, unspecified: Secondary | ICD-10-CM

## 2018-04-09 DIAGNOSIS — I2510 Atherosclerotic heart disease of native coronary artery without angina pectoris: Secondary | ICD-10-CM

## 2018-04-09 MED ORDER — TESTOSTERONE CYPIONATE 200 MG/ML IM SOLN: 150.0000 mg | INTRAMUSCULAR | Status: AC

## 2018-04-09 NOTE — Progress Notes (Signed)
CPE- See plan.  Routine anticipatory guidance given to patient.  See health maintenance.  The possibility exists that previously documented standard health maintenance information may have been brought forward from a previous encounter into this note.  If needed, that same information has been updated to reflect the current situation based on today's encounter.    Tetanus 2011 Flu done yearly at pharmacy, to be done.   Shingles d/w pt.   PNA not due D/w patient [id number] for colon cancer screening, including IFOB vs. colonoscopy. Risks and benefits of both were discussed and patient voiced understanding. Pt elects for: IFOB.   PSA per urology.  Living will d/w pt. Wife is designated if he is incapacitated.  Diet and exercise d/w pt. he has exercise bike and treadmill, is working on diet.  HIV prev neg 2002.   Hypertension/CAD.   Using medication without problems or lightheadedness: yes Chest pain with exertion:no Edema:no Short of breath:no BP controlled at home.  Usually higher at the clinic.  D/w pt.   Elevated Cholesterol: Using medications without problems:yes Muscle aches: no Diet compliance:encouraged.   Exercise: encouraged Labs d/w pt.    Rare colchicine use with relief for occ gout flare.  No ADE on med unless diarrhea with prolonged use  HGB elevation.  D/w pt about labs.  S/p phlebotomy.  Still on testosterone.  He felt better with more energy on T replacement, per urology.    PMH and SH reviewed  Meds, vitals, and allergies reviewed.   ROS: Per HPI.  Unless specifically indicated otherwise in HPI, the patient denies:  General: fever. Eyes: acute vision changes ENT: sore throat Cardiovascular: chest pain Respiratory: SOB GI: vomiting GU: dysuria Musculoskeletal: acute back pain Derm: acute rash Neuro: acute motor dysfunction Psych: worsening mood Endocrine: polydipsia Heme: bleeding Allergy: hayfever  GEN: nad, alert and oriented HEENT: mucous  membranes moist NECK: supple w/o LA CV: rrr. PULM: ctab, no inc wob ABD: soft, +bs EXT: no edema SKIN: no acute rash

## 2018-04-09 NOTE — Patient Instructions (Addendum)
Go to the lab on the way out.  We'll contact you with your lab report. Update me as needed.  Take care.  Glad to see you.  Thanks for your effort.

## 2018-04-10 NOTE — Assessment & Plan Note (Signed)
History of testosterone replacement, ongoing.  Previously elevated hemoglobin noted.  Hemoglobin normalized status post therapeutic phlebotomy.  Labs discussed with patient.  PSA per urology.  I will send a copy of labs to urology as FYI.  Appreciate help of all involved.

## 2018-04-10 NOTE — Assessment & Plan Note (Signed)
Controlled.  No change in meds.  Labs discussed with patient.  Will update cardiology regarding labs.  Continue as is.  Discussed diet and exercise.

## 2018-04-10 NOTE — Assessment & Plan Note (Signed)
Rare symptoms.  Using colchicine as needed.  Continue as is.

## 2018-04-10 NOTE — Assessment & Plan Note (Signed)
No CP, see HTN discussion.

## 2018-04-10 NOTE — Assessment & Plan Note (Signed)
Living will d/w pt.  Wife is designated if he is incapacitated.   

## 2018-04-10 NOTE — Assessment & Plan Note (Signed)
Tetanus 2011 Flu done yearly at pharmacy, to be done.   Shingles d/w pt.   PNA not due D/w patient [id number] for colon cancer screening, including IFOB vs. colonoscopy. Risks and benefits of both were discussed and patient voiced understanding. Pt elects for: IFOB.   PSA per urology.  Living will d/w pt. Wife is designated if he is incapacitated.  Diet and exercise d/w pt. he has exercise bike and treadmill, is working on diet.  HIV prev neg 2002.

## 2018-05-03 ENCOUNTER — Other Ambulatory Visit (INDEPENDENT_AMBULATORY_CARE_PROVIDER_SITE_OTHER): Payer: Managed Care, Other (non HMO)

## 2018-05-03 DIAGNOSIS — Z1211 Encounter for screening for malignant neoplasm of colon: Secondary | ICD-10-CM

## 2018-05-03 LAB — FECAL OCCULT BLOOD, IMMUNOCHEMICAL: Fecal Occult Bld: NEGATIVE

## 2018-06-11 ENCOUNTER — Telehealth: Payer: Self-pay | Admitting: *Deleted

## 2018-06-11 MED ORDER — FLUTICASONE-SALMETEROL 100-50 MCG/DOSE IN AEPB: INHALATION_SPRAY | RESPIRATORY_TRACT | 5 refills | Status: DC

## 2018-06-11 NOTE — Telephone Encounter (Signed)
Spoke to pt who states his Advair Rx was recently removed from his med list, at his 09/23 appt. He has since developed a cough and thinks his inhaler would be useful and is requesting a refill. pls advise

## 2018-06-11 NOTE — Telephone Encounter (Signed)
Sent.  If not better, then needs recheck. Thanks.

## 2018-06-11 NOTE — Telephone Encounter (Signed)
Patient advised.

## 2018-07-09 ENCOUNTER — Other Ambulatory Visit: Payer: Self-pay | Admitting: Family Medicine

## 2018-08-04 ENCOUNTER — Other Ambulatory Visit: Payer: Self-pay | Admitting: Interventional Cardiology

## 2018-08-08 ENCOUNTER — Other Ambulatory Visit: Payer: Self-pay | Admitting: Interventional Cardiology

## 2018-08-08 MED ORDER — ROSUVASTATIN CALCIUM 20 MG PO TABS: 20.0000 mg | ORAL_TABLET | Freq: Every day | ORAL | 0 refills | Status: DC

## 2018-08-08 MED ORDER — VALSARTAN-HYDROCHLOROTHIAZIDE 320-12.5 MG PO TABS: 1.0000 | ORAL_TABLET | Freq: Every day | ORAL | 0 refills | Status: DC

## 2018-08-08 NOTE — Telephone Encounter (Signed)
Pt's medication was sent to pt's pharmacy as requested. Confirmation received.  °

## 2018-08-24 ENCOUNTER — Telehealth: Payer: Self-pay | Admitting: Family Medicine

## 2018-08-24 NOTE — Telephone Encounter (Signed)
Pt stated he has already received his medication. His ins is separated from his medical.

## 2018-09-10 NOTE — Progress Notes (Signed)
Cardiology Office Note:    Date:  09/11/2018   ID:  Stephen [name], DOB 01-23-1961, MRN [id number]  PCP:  Tonia Ghent, MD  Cardiologist:  Sinclair Grooms, MD   Referring MD: Tonia Ghent, MD   Chief Complaint  Patient presents with  . Coronary Artery Disease  . Hypertension  . Fatigue    History of Present Illness:    Stephen [name] is a 58 y.o. male with a hx of for anterior ST elevation myocardial infarction treated with LAD drug-eluting stents 05/2014, hypertension, hyperlipidemia, prior tobacco abuse, erectile dysfunction,   He is doing well.  He has difficulty at work because of excessive fatigue.  He has not had chest pain, orthopnea, PND, lower extremity swelling, palpitations, or syncope.  According to his wife, he snores.  Does not snore as much now as he once did.  He awakens at least once at night to urinate.  Past Medical History:  Diagnosis Date  . Achilles tendon injury    right  . CAD S/P percutaneous coronary angioplasty 05/25/2014  . ED (erectile dysfunction)   . Elevated glucose   . GERD (gastroesophageal reflux disease)   . Gout   . Hyperlipidemia   . Hypertension   . Morton's neuroma    2014  . Seasonal allergies    uses advair in May  . ST elevation myocardial infarction (STEMI) of anterior wall ([place]) 05/25/14  . Tobacco abuse, + cigars 05/27/2014    Past Surgical History:  Procedure Laterality Date  . CORONARY ANGIOPLASTY WITH STENT PLACEMENT  05/25/14   Resolute integrity 3.5 X 18 mm DES 3.75 mm  . Fx L forearm closed fx  as a child  . Hospital / asthma     multiple times as child  . LEFT HEART CATHETERIZATION WITH CORONARY ANGIOGRAM N/A 05/25/2014   Procedure: LEFT HEART CATHETERIZATION WITH CORONARY ANGIOGRAM;  Surgeon: Sinclair Grooms, MD;  Location: [facility] CATH LAB;  Service: Cardiovascular;  Laterality: N/A;  . PERCUTANEOUS CORONARY STENT INTERVENTION (PCI-S)  05/25/2014   Procedure: PERCUTANEOUS CORONARY STENT  INTERVENTION (PCI-S);  Surgeon: Sinclair Grooms, MD;  Location: [facility] CATH LAB;  Service: Cardiovascular;;  PROX LAD     Current Medications: Current Meds  Medication Sig  . acetaminophen (TYLENOL) 500 MG tablet Take 500-1,000 mg by mouth daily as needed (pain).  Marland Kitchen aspirin 81 MG chewable tablet Chew 1 tablet (81 mg total) by mouth daily.  . carvedilol (COREG) 3.125 MG tablet Take 1 tablet (3.125 mg total) by mouth 2 (two) times daily.  . colchicine 0.6 MG tablet TAKE 1 TABLET BY MOUTH  DAILY AS NEEDED  . Fluticasone-Salmeterol (ADVAIR DISKUS) 100-50 MCG/DOSE AEPB inhale 1 dose by mouth twice a day, rinse after use.  Marland Kitchen Ketotifen Fumarate (ALAWAY OP) Place 1 drop into both eyes daily as needed (seasonal allergies).  . nitroGLYCERIN (NITROSTAT) 0.4 MG SL tablet Place 1 tablet (0.4 mg total) under the tongue every 5 (five) minutes as needed for chest pain.  . rosuvastatin (CRESTOR) 20 MG tablet Take 1 tablet (20 mg total) by mouth daily at 6 PM. Please keep upcoming appt in February for future refills. Thank you  . sildenafil (REVATIO) 20 MG tablet Take 20 mg to 100 mg by mouth daily as needed for erectile dysfunction.  . Syringe, Disposable, 3 ML MISC Use every 28 days with 22g 1 inch needle.  Use for testosterone injection.  Disp 25 syringes and 25 needles  . testosterone  cypionate (DEPOTESTOSTERONE CYPIONATE) 200 MG/ML injection Inject 0.75 mLs (150 mg total) into the muscle every 14 (fourteen) days.  . valsartan-hydrochlorothiazide (DIOVAN-HCT) 320-12.5 MG tablet Take 1 tablet by mouth daily. Please keep upcoming appt in February for future refills. Thank you  . [DISCONTINUED] nitroGLYCERIN (NITROSTAT) 0.4 MG SL tablet Place 1 tablet (0.4 mg total) under the tongue every 5 (five) minutes as needed for chest pain.   Current Facility-Administered Medications for the 09/11/18 encounter (Office Visit) with Belva Crome, MD  Medication  . betamethasone acetate-betamethasone sodium phosphate  (CELESTONE) injection 3 mg  . betamethasone acetate-betamethasone sodium phosphate (CELESTONE) injection 3 mg     Allergies:   Claritin-d 12 hour [loratadine-pseudoephedrine er]; Kiwi extract; Penicillins; and Simvastatin   Social History   Socioeconomic History  . Marital status: Married    Spouse name: Not on file  . Number of children: 1  . Years of education: Not on file  . Highest education level: Not on file  Occupational History  . Occupation: [employer]    CommentSecretary/administrator of special chemistries  Social Needs  . Financial resource strain: Not on file  . Food insecurity:    Worry: Not on file    Inability: Not on file  . Transportation needs:    Medical: Not on file    Non-medical: Not on file  Tobacco Use  . Smoking status: Former Smoker    Types: Cigars    Last attempt to quit: 05/25/2014    Years since quitting: 4.3  . Smokeless tobacco: Never Used  Substance and Sexual Activity  . Alcohol use: Yes    Alcohol/week: 2.0 standard drinks    Types: 2 Cans of beer per week    Comment: occ/episodic  . Drug use: No  . Sexual activity: Yes  Lifestyle  . Physical activity:    Days per week: Not on file    Minutes per session: Not on file  . Stress: Not on file  Relationships  . Social connections:    Talks on phone: Not on file    Gets together: Not on file    Attends religious service: Not on file    Active member of club or organization: Not on file    Attends meetings of clubs or organizations: Not on file    Relationship status: Not on file  Other Topics Concern  . Not on file  Social History Narrative   One son, 1 stepson   Married 2001   From [place]   Works for [employer].    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      Family History: The patient's family history includes AAA (abdominal aortic aneurysm) in his father; Alcohol abuse in his brother; Dementia in his father; Hyperlipidemia in his sister; Hypertension in his father  and sister; Stroke in his mother; Stroke (age of onset: 53) in his father. There is no history of Prostate cancer or Colon cancer.  ROS:   Please see the history of present illness.    Has had difficulty with low back.  He and his wife moved from an apartment to a house that they built.  Has gained weight.  All other systems reviewed and are negative.  EKGs/Labs/Other Studies Reviewed:    The following studies were reviewed today: Most recent LDL was below target of 70, at 56 and September 2019.  EKG:  EKG normal sinus rhythm, vertical axis, borderline first-degree AV block.  Recent Labs: 04/05/2018:  ALT 30; BUN 13; Creatinine, Ser 1.03; Hemoglobin 16.7; Platelets 263; Potassium 4.7; Sodium 142  Recent Lipid Panel    Component Value Date/Time   CHOL 120 04/05/2018 0823   TRIG 89 04/05/2018 0823   HDL 46 04/05/2018 0823   CHOLHDL 2.6 04/05/2018 0823   CHOLHDL 3 07/28/2014 0903   VLDL 24.2 07/28/2014 0903   LDLCALC 56 04/05/2018 0823    Physical Exam:    VS:  BP (!) 138/92   Pulse 73   Ht 5\' 7"  (1.702 m)   Wt 218 lb 6.4 oz (99.1 kg)   SpO2 97%   BMI 34.21 kg/m     Wt Readings from Last 3 Encounters:  09/11/18 218 lb 6.4 oz (99.1 kg)  04/09/18 214 lb (97.1 kg)  09/06/17 212 lb (96.2 kg)     GEN: Moderate obesity. No acute distress HEENT: Normal NECK: No JVD. LYMPHATICS: No lymphadenopathy CARDIAC: RRR.  No murmur, no gallop, no edema VASCULAR: 2+ pulses, 2+ bruits RESPIRATORY:  Clear to auscultation without rales, wheezing or rhonchi  ABDOMEN: Soft, non-tender, non-distended, No pulsatile mass, MUSCULOSKELETAL: No deformity  SKIN: Warm and dry NEUROLOGIC:  Alert and oriented x 3 PSYCHIATRIC:  Normal affect   ASSESSMENT:    1. CAD in native artery   2. Hyperlipidemia with target LDL less than 70   3. Essential hypertension   4. Fatigue, unspecified type    PLAN:    In order of problems listed above:  1. Stable from cardiac standpoint.  Blood pressure is a  little high.  Activity level is a little low.  He has gained weight.  LDL is at target.   2. Continue high intensity statin therapy. 3. Diastolic blood pressure is a little high.  Plan exercise treadmill test on antihypertensive therapy to gauge blood pressure control. 4. I am concerned that he has sleep apnea and given his extreme fatigue despite a full night of sleep.  Recommended a sleep study but he is not excepting this recommendation at this time.  Plan exercise treadmill test on antihypertensive therapy to rule out ischemia and to assess blood pressure control.  Overall education and awareness concerning primary/secondary risk prevention was discussed in detail: LDL less than 70, hemoglobin A1c less than 7, blood pressure target less than 130/80 mmHg, >150 minutes of moderate aerobic activity per week, avoidance of smoking, weight control (via diet and exercise), and continued surveillance/management of/for obstructive sleep apnea.  1 year follow-up   Medication Adjustments/Labs and Tests Ordered: Current medicines are reviewed at length with the patient today.  Concerns regarding medicines are outlined above.  Orders Placed This Encounter  Procedures  . Exercise Tolerance Test  . EKG 12-Lead   Meds ordered this encounter  Medications  . nitroGLYCERIN (NITROSTAT) 0.4 MG SL tablet    Sig: Place 1 tablet (0.4 mg total) under the tongue every 5 (five) minutes as needed for chest pain.    Dispense:  25 tablet    Refill:  3    There are no Patient Instructions on file for this visit.   Signed, Sinclair Grooms, MD  09/11/2018 10:12 AM    [place]

## 2018-09-11 ENCOUNTER — Encounter: Payer: Self-pay | Admitting: Interventional Cardiology

## 2018-09-11 ENCOUNTER — Ambulatory Visit: Payer: Managed Care, Other (non HMO) | Admitting: Interventional Cardiology

## 2018-09-11 VITALS — BP 138/92 | HR 73 | Ht 67.0 in | Wt 218.4 lb

## 2018-09-11 DIAGNOSIS — E785 Hyperlipidemia, unspecified: Secondary | ICD-10-CM | POA: Diagnosis not present

## 2018-09-11 DIAGNOSIS — I2510 Atherosclerotic heart disease of native coronary artery without angina pectoris: Secondary | ICD-10-CM | POA: Diagnosis not present

## 2018-09-11 DIAGNOSIS — I10 Essential (primary) hypertension: Secondary | ICD-10-CM

## 2018-09-11 DIAGNOSIS — R5383 Other fatigue: Secondary | ICD-10-CM

## 2018-09-11 MED ORDER — NITROGLYCERIN 0.4 MG SL SUBL: 0.4000 mg | SUBLINGUAL_TABLET | SUBLINGUAL | 3 refills | Status: DC | PRN

## 2018-09-11 NOTE — Patient Instructions (Signed)
Medication Instructions:  Your physician recommends that you continue on your current medications as directed. Please refer to the Current Medication list given to you today.  If you need a refill on your cardiac medications before your next appointment, please call your pharmacy.   Lab work: None If you have labs (blood work) drawn today and your tests are completely normal, you will receive your results only by: Marland Kitchen MyChart Message (if you have MyChart) OR . A paper copy in the mail If you have any lab test that is abnormal or we need to change your treatment, we will call you to review the results.  Testing/Procedures: Your physician has requested that you have an exercise tolerance test. For further information please visit [email or web address]. Please also follow instruction sheet, as given.   Follow-Up: At [facility], you and your health needs are our priority.  As part of our continuing mission to provide you with exceptional heart care, we have created designated Provider Care Teams.  These Care Teams include your primary Cardiologist (physician) and Advanced Practice Providers (APPs -  Physician Assistants and Nurse Practitioners) who all work together to provide you with the care you need, when you need it. You will need a follow up appointment in 12 months.  Please call our office 2 months in advance to schedule this appointment.  You may see Sinclair Grooms, MD or one of the following Advanced Practice Providers on your designated Care Team:   Truitt Merle, NP Cecilie Kicks, NP . Kathyrn Drown, NP  Any Other Special Instructions Will Be Listed Below (If Applicable).  Think about doing a sleep study and let us know if you decide to move forward with that.  Your provider recommends that you maintain 150 minutes per week of moderate aerobic activity.

## 2018-09-21 ENCOUNTER — Telehealth: Payer: Self-pay | Admitting: Interventional Cardiology

## 2018-09-21 NOTE — Telephone Encounter (Signed)
Spoke with patient and advised that per Dr. Thompson Caul instructions from office visit on 2/25, patient should continue BP medications on the day of the ETT. Patient verbalized understanding and agreement and thanked me for my help.

## 2018-09-21 NOTE — Telephone Encounter (Signed)
Pt wants to know what medications he can and cant take before his test Tuesday

## 2018-09-25 ENCOUNTER — Other Ambulatory Visit: Payer: Self-pay | Admitting: Interventional Cardiology

## 2018-09-25 ENCOUNTER — Ambulatory Visit (INDEPENDENT_AMBULATORY_CARE_PROVIDER_SITE_OTHER): Payer: Managed Care, Other (non HMO)

## 2018-09-25 DIAGNOSIS — I10 Essential (primary) hypertension: Secondary | ICD-10-CM

## 2018-09-25 DIAGNOSIS — I2510 Atherosclerotic heart disease of native coronary artery without angina pectoris: Secondary | ICD-10-CM | POA: Diagnosis not present

## 2018-09-25 DIAGNOSIS — E785 Hyperlipidemia, unspecified: Secondary | ICD-10-CM

## 2018-09-25 LAB — EXERCISE TOLERANCE TEST
Estimated workload: 10.1 METS
Exercise duration (min): 8 min
Exercise duration (sec): 43 s
MPHR: 163 {beats}/min
Peak HR: 109 {beats}/min
Percent HR: 66 %
RPE: 14
Rest HR: 61 {beats}/min

## 2018-09-26 ENCOUNTER — Telehealth: Payer: Self-pay | Admitting: *Deleted

## 2018-09-26 MED ORDER — AMLODIPINE BESYLATE 5 MG PO TABS: 5.0000 mg | ORAL_TABLET | Freq: Every day | ORAL | 1 refills | Status: DC

## 2018-09-26 NOTE — Telephone Encounter (Signed)
Spoke with pt and went over results and recommendations.  Pt verbalized understanding and was in agreement with this plan.  Scheduled pt to come in and see Truitt Merle, NP on 4/13.  Advised pt to monitor BP and bring those readings to his appt.  Advised to call sooner if any issues with medication.  30 day supply sent to local pharmacy with one refill.  If tolerated pt will switch prescription to mail order pharmacy.

## 2018-09-26 NOTE — Telephone Encounter (Signed)
-----   Message from Belva Crome, MD sent at 09/25/2018  5:01 PM EDT ----- Let the patient know that there is no evidence of ischemia at the level of stress achieved. BP is a problem and additional therapy is needed.Recommend amlodipine 5 mg daily. Monitor BP. F/U with me or team member in 6 weeks with BP recordings, to be reassessed. A copy will be sent to Tonia Ghent, MD

## 2018-10-03 ENCOUNTER — Other Ambulatory Visit: Payer: Self-pay

## 2018-10-22 ENCOUNTER — Telehealth: Payer: Self-pay | Admitting: Nurse Practitioner

## 2018-10-22 NOTE — Telephone Encounter (Signed)
Message sent thru My Chart regarding changing his upcoming visit to a virtual visit with me on 10/31/2018 at 8 am.   Burtis Junes, RN, [place] [address] [place], [place]  [phone]

## 2018-10-29 ENCOUNTER — Telehealth: Payer: Self-pay | Admitting: *Deleted

## 2018-10-29 ENCOUNTER — Ambulatory Visit: Payer: Managed Care, Other (non HMO) | Admitting: Nurse Practitioner

## 2018-10-29 NOTE — Telephone Encounter (Signed)
Virtual Visit Pre-Appointment Phone Call  Steps For Call:  1. Confirm consent - "In the setting of the current Covid19 crisis, you are scheduled for a ([facility]) visit with your provider on (Wednesday, April 15) at (8:00 am).  Just as we do with many in-office visits, in order for you to participate in this visit, we must obtain consent.  If you'd like, I can send this to your mychart (if signed up) or email for you to review.  Otherwise, I can obtain your verbal consent now.  All virtual visits are billed to your insurance company just like a normal visit would be.  By agreeing to a virtual visit, we'd like you to understand that the technology does not allow for your provider to perform an examination, and thus may limit your provider's ability to fully assess your condition.  Finally, though the technology is pretty good, we cannot assure that it will always work on either your or our end, and in the setting of a video visit, we may have to convert it to a phone-only visit.  In either situation, we cannot ensure that we have a secure connection.  Are you willing to proceed?"  2. Give patient instructions for WebEx download to smartphone as below if video visit  3. Advise patient to be prepared with any vital sign or heart rhythm information, their current medicines, and a piece of paper and pen handy for any instructions they may receive the day of their visit  4. Inform patient they will receive a phone call 15 minutes prior to their appointment time (may be from unknown caller ID) so they should be prepared to answer  5. Confirm that appointment type is correct in Epic appointment notes (video vs telephone)    TELEPHONE CALL NOTE  Stephen [name] has been deemed a candidate for a follow-up tele-health visit to limit community exposure during the Covid-19 pandemic. I spoke with the patient via phone to ensure availability of phone/video source, confirm preferred email & phone number,  and discuss instructions and expectations.  I reminded Stephen [name] to be prepared with any vital sign and/or heart rhythm information that could potentially be obtained via home monitoring, at the time of his visit. I reminded Stephen [name] to expect a phone call at the time of his visit if his visit.  Did the patient verbally acknowledge consent to treatment? YES  [name] [name] 10/29/2018 8:06 AM   DOWNLOADING THE [place], go to [employer] and type in WebEx in the search bar. [employer], the blue/green circle. The app is free but as with any other app downloads, their phone may require them to verify saved payment information or Apple password. The patient does NOT have to create an account.  - If Android, ask patient to go to [employer] and type in WebEx in the search bar. [place] [employer], the blue/green circle. The app is free but as with any other app downloads, their phone may require them to verify saved payment information or Android password. The patient does NOT have to create an account.   CONSENT FOR TELE-HEALTH VISIT - PLEASE REVIEW  I hereby voluntarily request, consent and authorize [facility] and its employed or contracted physicians, physician assistants, nurse practitioners or other licensed health care professionals (the Practitioner), to provide me with telemedicine health care services (the "Services") as deemed necessary by the treating Practitioner. I acknowledge and  consent to receive the Services by the Practitioner via telemedicine. I understand that the telemedicine visit will involve communicating with the Practitioner through live audiovisual communication technology and the disclosure of certain medical information by electronic transmission. I acknowledge that I have been given the opportunity to request an in-person assessment or other available alternative prior to the  telemedicine visit and am voluntarily participating in the telemedicine visit.  I understand that I have the right to withhold or withdraw my consent to the use of telemedicine in the course of my care at any time, without affecting my right to future care or treatment, and that the Practitioner or I may terminate the telemedicine visit at any time. I understand that I have the right to inspect all information obtained and/or recorded in the course of the telemedicine visit and may receive copies of available information for a reasonable fee.  I understand that some of the potential risks of receiving the Services via telemedicine include:  Marland Kitchen Delay or interruption in medical evaluation due to technological equipment failure or disruption; . Information transmitted may not be sufficient (e.g. poor resolution of images) to allow for appropriate medical decision making by the Practitioner; and/or  . In rare instances, security protocols could fail, causing a breach of personal health information.  Furthermore, I acknowledge that it is my responsibility to provide information about my medical history, conditions and care that is complete and accurate to the best of my ability. I acknowledge that Practitioner's advice, recommendations, and/or decision may be based on factors not within their control, such as incomplete or inaccurate data provided by me or distortions of diagnostic images or specimens that may result from electronic transmissions. I understand that the practice of medicine is not an exact science and that Practitioner makes no warranties or guarantees regarding treatment outcomes. I acknowledge that I will receive a copy of this consent concurrently upon execution via email to the email address I last provided but may also request a printed copy by calling the office of [place].    I understand that my insurance will be billed for this visit.   I have read or had this consent read to me.  . I understand the contents of this consent, which adequately explains the benefits and risks of the Services being provided via telemedicine.  . I have been provided ample opportunity to ask questions regarding this consent and the Services and have had my questions answered to my satisfaction. . I give my informed consent for the services to be provided through the use of telemedicine in my medical care  By participating in this telemedicine visit I agree to the above.

## 2018-10-30 NOTE — Progress Notes (Signed)
Telehealth Visit     Virtual Visit via Video Note   This visit type was conducted due to national recommendations for restrictions regarding the COVID-19 Pandemic (e.g. social distancing) in an effort to limit this patient's exposure and mitigate transmission in our community.  Due to his co-morbid illnesses, this patient is at least at moderate risk for complications without adequate follow up.  This format is felt to be most appropriate for this patient at this time.  All issues noted in this document were discussed and addressed.  A limited physical exam was performed with this format.  Please refer to the patient's chart for his consent to telehealth for [facility].   Evaluation Performed:  Follow-up visit  This visit type was conducted due to national recommendations for restrictions regarding the COVID-19 Pandemic (e.g. social distancing).  This format is felt to be most appropriate for this patient at this time.  All issues noted in this document were discussed and addressed.  No physical exam was performed (except for noted visual exam findings with Video Visits).  Please refer to the patient's chart (MyChart message for video visits and phone note for telephone visits) for the patient's consent to telehealth for [facility].  Date:  10/31/2018   ID:  Stephen [name], DOB 1960/11/08, MRN [id number]  Patient Location:  Home  Provider location:   Home  PCP:  Tonia Ghent, MD  Cardiologist:  Servando Snare Sinclair Grooms, MD  Electrophysiologist:  None   Chief Complaint:  Follow up  History of Present Illness:    Stephen [name] is a 58 y.o. male who presents via audio/video conferencing for a telehealth visit today.  Seen for Dr. Tamala Julian.   He has a history of CAD with prior anterior ST elevation myocardial infarction treated with LAD drug-eluting stents11/2015, hypertension, hyperlipidemia, prior tobacco abuse, & erectile dysfunction.   Seen by Dr. Tamala Julian back in  February - felt to be doing well other than excessive fatigue while at work. He was referred for GXT to assess BP response - this proved to be hypertensive - Norvasc was added to his regimen.   My Chart message from a few weeks ago regarding BP readings: 09/27/18 5AM 127/67  09/28/18 5AM 141 /86  09/29/18 6AM 136/91  09/30/18 8AM 140 82  10/01/18 7PM 147 /84  10/03/18 6.30AM 133/83  10/04/18 5AM 130/78  10/06/18 3PM 137/86  10/07/18 7.30AM 141 /80  10/10/18 7.00AM 129/72  10/11/18 5.40AM 121/77  10/16/18 1.20pm 130/82  10/18/18 11.20AM 132/83  10/24/18 10.52AM 135/83  10/29/18 12PM 155/86  10/31/18 6.35AM 134 /88   The patient does not have symptoms concerning for COVID-19 infection (fever, chills, cough, or new shortness of breath).   Seen today via Doximity video. He has consented for this visit. He feels like he is doing well. Working primarily remotely at home for [employer]. No chest pain. Breathing is ok. Having seasonal allergies. Using some exercise equipment at home. Tolerating his current regimen. Taking the Norvasc in the evening. Has noted some elevation in his readings when using Advair (noted in [facility] that this can occur). Overall, no real concerns and he feels like he is doing well. Does need his Norvasc refilled today.   Past Medical History:  Diagnosis Date  . Achilles tendon injury    right  . CAD S/P percutaneous coronary angioplasty 05/25/2014  . ED (erectile dysfunction)   . Elevated glucose   . GERD (gastroesophageal reflux disease)   . Gout   .  Hyperlipidemia   . Hypertension   . Morton's neuroma    2014  . Seasonal allergies    uses advair in May  . ST elevation myocardial infarction (STEMI) of anterior wall ([place]) 05/25/14  . Tobacco abuse, + cigars 05/27/2014   Past Surgical History:  Procedure Laterality Date  . CORONARY ANGIOPLASTY WITH STENT PLACEMENT  05/25/14   Resolute integrity 3.5 X 18 mm DES 3.75 mm  . Fx L forearm closed fx  as a child   . Hospital / asthma     multiple times as child  . LEFT HEART CATHETERIZATION WITH CORONARY ANGIOGRAM N/A 05/25/2014   Procedure: LEFT HEART CATHETERIZATION WITH CORONARY ANGIOGRAM;  Surgeon: Sinclair Grooms, MD;  Location: [facility] LAB;  Service: Cardiovascular;  Laterality: N/A;  . PERCUTANEOUS CORONARY STENT INTERVENTION (PCI-S)  05/25/2014   Procedure: PERCUTANEOUS CORONARY STENT INTERVENTION (PCI-S);  Surgeon: Sinclair Grooms, MD;  Location: [facility] CATH LAB;  Service: Cardiovascular;;  PROX LAD      Current Meds  Medication Sig  . acetaminophen (TYLENOL) 500 MG tablet Take 500-1,000 mg by mouth daily as needed (pain).  Marland Kitchen amLODipine (NORVASC) 5 MG tablet Take 1 tablet (5 mg total) by mouth daily.  Marland Kitchen aspirin 81 MG chewable tablet Chew 1 tablet (81 mg total) by mouth daily.  . carvedilol (COREG) 3.125 MG tablet TAKE 1 TABLET BY MOUTH TWO  TIMES DAILY  . colchicine 0.6 MG tablet TAKE 1 TABLET BY MOUTH  DAILY AS NEEDED  . Fluticasone-Salmeterol (WIXELA INHUB) 100-50 MCG/DOSE AEPB Inhale 1 puff into the lungs as needed (allergies).  . Ketotifen Fumarate (ALAWAY OP) Place 1 drop into both eyes daily as needed (seasonal allergies).  . nitroGLYCERIN (NITROSTAT) 0.4 MG SL tablet Place 1 tablet (0.4 mg total) under the tongue every 5 (five) minutes as needed for chest pain.  . rosuvastatin (CRESTOR) 20 MG tablet Take 1 tablet (20 mg total) by mouth daily.  . sildenafil (REVATIO) 20 MG tablet Take 20 mg to 100 mg by mouth daily as needed for erectile dysfunction.  Marland Kitchen testosterone cypionate (DEPOTESTOSTERONE CYPIONATE) 200 MG/ML injection Inject 0.75 mLs (150 mg total) into the muscle every 14 (fourteen) days.  . valsartan-hydrochlorothiazide (DIOVAN-HCT) 320-12.5 MG tablet Take 1 tablet by mouth daily.   Current Facility-Administered Medications for the 10/31/18 encounter (Telemedicine) with Burtis Junes, NP  Medication  . betamethasone acetate-betamethasone sodium phosphate (CELESTONE) injection  3 mg  . betamethasone acetate-betamethasone sodium phosphate (CELESTONE) injection 3 mg     Allergies:   Claritin-d 12 hour [loratadine-pseudoephedrine er]; Kiwi extract; Penicillins; and Simvastatin   Social History   Tobacco Use  . Smoking status: Former Smoker    Types: Cigars    Last attempt to quit: 05/25/2014    Years since quitting: 4.4  . Smokeless tobacco: Never Used  Substance Use Topics  . Alcohol use: Yes    Alcohol/week: 2.0 standard drinks    Types: 2 Cans of beer per week    Comment: occ/episodic  . Drug use: No     Family Hx: The patient's family history includes AAA (abdominal aortic aneurysm) in his father; Alcohol abuse in his brother; Dementia in his father; Hyperlipidemia in his sister; Hypertension in his father and sister; Stroke in his mother; Stroke (age of onset: 60) in his father. There is no history of Prostate cancer or Colon cancer.  ROS:   Please see the history of present illness.   All other systems reviewed are negative  except for seasonal allergies.    Objective:    Vital Signs:  BP 134/88   Pulse (!) 215   Ht 5\' 7"  (1.702 m)   Wt 215 lb (97.5 kg)   BMI 33.67 kg/m    Wt Readings from Last 3 Encounters:  10/31/18 215 lb (97.5 kg)  09/11/18 218 lb 6.4 oz (99.1 kg)  04/09/18 214 lb (97.1 kg)    He is alert. He is in no acute distress. Not short of breath with conversation. Color normal.    Labs/Other Tests and Data Reviewed:    Lab Results  Component Value Date   WBC 6.6 04/05/2018   HGB 16.7 04/05/2018   HCT 48.4 04/05/2018   PLT 263 04/05/2018   GLUCOSE 91 04/05/2018   CHOL 120 04/05/2018   TRIG 89 04/05/2018   HDL 46 04/05/2018   LDLCALC 56 04/05/2018   ALT 30 04/05/2018   AST 22 04/05/2018   NA 142 04/05/2018   K 4.7 04/05/2018   CL 102 04/05/2018   CREATININE 1.03 04/05/2018   BUN 13 04/05/2018   CO2 26 04/05/2018   TSH 2.370 10/24/2013   PSA 0.9 03/06/2014   INR 1.0 05/25/2014   HGBA1C 5.5 02/17/2016      BNP (last 3 results) No results for input(s): BNP in the last 8760 hours.  ProBNP (last 3 results) No results for input(s): PROBNP in the last 8760 hours.    Prior CV studies:    The following studies were reviewed today:  GXT Study Highlights 09/2018   The patient walked for a total of 8 minutes and 43 seconds on a Bruce protocol treadmill test. His peak heart rate was 109 which is 66% predicted maximal heart rate.  At peak exercise there were no ST or T wave changes.  Blood pressure demonstrated a hypertensive response to exercise.  This is interpreted as a nondiagnostic exercise test due to inability to achieve target heart rate. The patient had an exaggerated hypertensive response. There was no evidence of ischemia at submaximal exercise.     Notes recorded by Belva Crome, MD on 09/25/2018 at 5:01 PM EDT Let the patient know that there is no evidence of ischemia at the level of stress achieved. BP is a problem and additional therapy is needed.Recommend amlodipine 5 mg daily. Monitor BP. F/U with me or team member in 6 weeks with BP recordings, to be reassessed. A copy will be sent to Tonia Ghent, MD    ASSESSMENT & PLAN:    1.  HTN - has had recent addition of Norvasc added due to hypertensive response with GXT - BP readings are good - no further changes needed at this time. He will continue to monitor.   2. CAD with prior anterior MI with PCI to LAD - no active chest pain noted.   3. HLD - on statin therapy - last LDL was 56 from 03/2018  4. Fatigue - not endorsed today.   5. Concern for OSA - he has not wanted to proceed with sleep study - not discussed today.   6. COVID-19 Education: The signs and symptoms of COVID-19 were discussed with the patient and how to seek care for testing (follow up with PCP or arrange E-visit).  The importance of social distancing, staying at home and hand hygiene were discussed today.  Patient Risk:   After full review of this  patient's clinical status, I feel that they are at least moderate risk at this  time.  Time:   Today, I have spent 10 minutes with the patient with telehealth technology discussing the above issues.     Medication Adjustments/Labs and Tests Ordered: Current medicines are reviewed at length with the patient today.  Concerns regarding medicines are outlined above.   Tests Ordered: No orders of the defined types were placed in this encounter.   Medication Changes: No orders of the defined types were placed in this encounter.   Disposition:  FU with Dr. Tamala Julian in 6 months. I am happy to see back as needed.   Patient is agreeable to this plan and will call if any problems develop in the interim.   Amie Critchley, NP  10/31/2018 8:10 AM    [place]

## 2018-10-31 ENCOUNTER — Encounter: Payer: Self-pay | Admitting: Nurse Practitioner

## 2018-10-31 ENCOUNTER — Other Ambulatory Visit: Payer: Self-pay

## 2018-10-31 ENCOUNTER — Telehealth (INDEPENDENT_AMBULATORY_CARE_PROVIDER_SITE_OTHER): Payer: Managed Care, Other (non HMO) | Admitting: Nurse Practitioner

## 2018-10-31 VITALS — BP 134/88 | HR 215 | Ht 67.0 in | Wt 215.0 lb

## 2018-10-31 DIAGNOSIS — Z79899 Other long term (current) drug therapy: Secondary | ICD-10-CM

## 2018-10-31 DIAGNOSIS — E785 Hyperlipidemia, unspecified: Secondary | ICD-10-CM | POA: Diagnosis not present

## 2018-10-31 DIAGNOSIS — Z7189 Other specified counseling: Secondary | ICD-10-CM

## 2018-10-31 DIAGNOSIS — I2510 Atherosclerotic heart disease of native coronary artery without angina pectoris: Secondary | ICD-10-CM | POA: Diagnosis not present

## 2018-10-31 DIAGNOSIS — I10 Essential (primary) hypertension: Secondary | ICD-10-CM | POA: Diagnosis not present

## 2018-10-31 MED ORDER — AMLODIPINE BESYLATE 5 MG PO TABS: 5.0000 mg | ORAL_TABLET | Freq: Every day | ORAL | 3 refills | Status: DC

## 2018-10-31 NOTE — Patient Instructions (Addendum)
After Visit Summary:  We will be checking the following labs today - NONE   Medication Instructions:    Continue with your current medicines.   I have sent your refill for the Amlodipine to [facility]   If you need a refill on your cardiac medications before your next appointment, please call your pharmacy.     Testing/Procedures To Be Arranged:  N/A  Follow-Up:   See Dr. Tamala Julian in 6 months. I am happy to see you back as needed.     At [facility], you and your health needs are our priority.  As part of our continuing mission to provide you with exceptional heart care, we have created designated Provider Care Teams.  These Care Teams include your primary Cardiologist (physician) and Advanced Practice Providers (APPs -  Physician Assistants and Nurse Practitioners) who all work together to provide you with the care you need, when you need it.  Special Instructions:  . Stay safe, stay home and wash your hands for at least 20 seconds! . It was good to see and talk with you today.  . Continue to monitor your BP for [place].  Call the [place] office at [phone] if you have any questions, problems or concerns.

## 2019-05-07 ENCOUNTER — Other Ambulatory Visit: Payer: Self-pay

## 2019-05-07 NOTE — Progress Notes (Signed)
Patient pre screened for office appointment, no questions or concerns today. 

## 2019-05-08 ENCOUNTER — Inpatient Hospital Stay: Payer: Managed Care, Other (non HMO) | Attending: Oncology | Admitting: Oncology

## 2019-05-08 ENCOUNTER — Other Ambulatory Visit: Payer: Self-pay

## 2019-05-08 ENCOUNTER — Encounter: Payer: Self-pay | Admitting: Oncology

## 2019-05-08 VITALS — BP 158/84 | HR 67 | Temp 96.8°F | Resp 18 | Ht 67.0 in | Wt 222.6 lb

## 2019-05-08 DIAGNOSIS — E291 Testicular hypofunction: Secondary | ICD-10-CM | POA: Diagnosis not present

## 2019-05-08 DIAGNOSIS — Z7982 Long term (current) use of aspirin: Secondary | ICD-10-CM

## 2019-05-08 DIAGNOSIS — Z8249 Family history of ischemic heart disease and other diseases of the circulatory system: Secondary | ICD-10-CM | POA: Diagnosis not present

## 2019-05-08 DIAGNOSIS — D751 Secondary polycythemia: Secondary | ICD-10-CM | POA: Diagnosis not present

## 2019-05-08 DIAGNOSIS — I10 Essential (primary) hypertension: Secondary | ICD-10-CM | POA: Insufficient documentation

## 2019-05-08 DIAGNOSIS — Z87891 Personal history of nicotine dependence: Secondary | ICD-10-CM

## 2019-05-08 NOTE — Progress Notes (Signed)
Patient here to establish care. No concerns voiced.

## 2019-05-08 NOTE — Progress Notes (Signed)
Hematology/Oncology Consult note [facility] Telephone:([phone] [phone]   Patient Care Team: Tonia Ghent, MD as PCP - General (Family Medicine) Belva Crome, MD as PCP - Cardiology (Cardiology) Pa, [facility] (Urology) Festus Aloe, MD as Consulting Physician (Urology)  REFERRING PROVIDER: Tonia Ghent, MD  CHIEF COMPLAINTS/REASON FOR VISIT:  Evaluation of polycytosis  HISTORY OF PRESENTING ILLNESS:  Stephen [name] is a 58 y.o. male who was seen in consultation at the request of Damita Dunnings, Elveria Rising, MD for evaluation of polycytosis/erythrocytosis Patient does not have recent blood work in current EMR. Reviewed previous blood work that was scanned into EMR. 10/15/2018 hemoglobin 17.4, hematocrit 51.7 12/12/2018 hemoglobin 17.8, hematocrit 50.5. 01/04/2019 hemoglobin 17.2, hematocrit 48  Patient has been well testosterone supplementation for treatment of primary hypogonadism since 2015.  [facility].  Currently on testosterone 150 mg injection every 2 weeks Reports previous history of myocardial infarction, in 2060  Associated signs or symptoms: Denies weight loss, fever, chills, fatigue, night sweats.   Context:  Smoking history: Denies.  Former smoker. Testosterone supplements: See above. History of blood clots: Denies Daytime somnolence: Denies Family history of polycythemia: Father with erythrocytosis and need phlebotomy.  Per patient, father probably also had secondary erythrocytosis.  Details not clear.  Patient has had phlebotomy previously, obtained by urologist.  Reports that recently he had had a phlebotomy a few weeks ago.  1 week after phlebotomy, his hemoglobin was around 17.  I do not have his most recent blood work.  Review of Systems  Constitutional: Negative for appetite change, chills, fatigue, fever and unexpected weight change.  HENT:   Negative for hearing loss and  voice change.   Eyes: Negative for eye problems and icterus.  Respiratory: Negative for chest tightness, cough and shortness of breath.   Cardiovascular: Negative for chest pain and leg swelling.  Gastrointestinal: Negative for abdominal distention and abdominal pain.  Endocrine: Negative for hot flashes.  Genitourinary: Negative for difficulty urinating, dysuria and frequency.   Musculoskeletal: Negative for arthralgias.  Skin: Negative for itching and rash.  Neurological: Negative for light-headedness and numbness.  Hematological: Negative for adenopathy. Does not bruise/bleed easily.  Psychiatric/Behavioral: Negative for confusion.    MEDICAL HISTORY:  Past Medical History:  Diagnosis Date  . Achilles tendon injury    right  . CAD S/P percutaneous coronary angioplasty 05/25/2014  . ED (erectile dysfunction)   . Elevated glucose   . GERD (gastroesophageal reflux disease)   . Gout   . Hyperlipidemia   . Hypertension   . Morton's neuroma    2014  . Seasonal allergies    uses advair in May  . ST elevation myocardial infarction (STEMI) of anterior wall ([place]) 05/25/14  . Tobacco abuse, + cigars 05/27/2014    SURGICAL HISTORY: Past Surgical History:  Procedure Laterality Date  . CORONARY ANGIOPLASTY WITH STENT PLACEMENT  05/25/14   Resolute integrity 3.5 X 18 mm DES 3.75 mm  . Fx L forearm closed fx  as a child  . Hospital / asthma     multiple times as child  . LEFT HEART CATHETERIZATION WITH CORONARY ANGIOGRAM N/A 05/25/2014   Procedure: LEFT HEART CATHETERIZATION WITH CORONARY ANGIOGRAM;  Surgeon: Sinclair Grooms, MD;  Location: [facility] CATH LAB;  Service: Cardiovascular;  Laterality: N/A;  . PERCUTANEOUS CORONARY STENT INTERVENTION (PCI-S)  05/25/2014   Procedure: PERCUTANEOUS CORONARY STENT INTERVENTION (PCI-S);  Surgeon: Sinclair Grooms, MD;  Location: [facility] CATH  LAB;  Service: Cardiovascular;;  PROX LAD     SOCIAL HISTORY: Social History   Socioeconomic History  .  Marital status: Married    Spouse name: Not on file  . Number of children: 1  . Years of education: Not on file  . Highest education level: Not on file  Occupational History  . Occupation: [employer]    CommentSecretary/administrator of special chemistries  Social Needs  . Financial resource strain: Not on file  . Food insecurity    Worry: Not on file    Inability: Not on file  . Transportation needs    Medical: Not on file    Non-medical: Not on file  Tobacco Use  . Smoking status: Former Smoker    Types: Cigars    Quit date: 05/25/2014    Years since quitting: 4.9  . Smokeless tobacco: Never Used  Substance and Sexual Activity  . Alcohol use: Yes    Alcohol/week: 2.0 standard drinks    Types: 2 Cans of beer per week    Comment: occ/episodic  . Drug use: No  . Sexual activity: Yes  Lifestyle  . Physical activity    Days per week: Not on file    Minutes per session: Not on file  . Stress: Not on file  Relationships  . Social Herbalist on phone: Not on file    Gets together: Not on file    Attends religious service: Not on file    Active member of club or organization: Not on file    Attends meetings of clubs or organizations: Not on file    Relationship status: Not on file  . Intimate partner violence    Fear of current or ex partner: Not on file    Emotionally abused: Not on file    Physically abused: Not on file    Forced sexual activity: Not on file  Other Topics Concern  . Not on file  Social History Narrative   One son, 1 stepson   Married 2001   From [place]   Works for [employer].    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     FAMILY HISTORY: Family History  Problem Relation Age of Onset  . Stroke Mother   . Hypertension Father   . Dementia Father   . Stroke Father 15       Multiple  . AAA (abdominal aortic aneurysm) Father   . Hyperlipidemia Sister   . Hypertension Sister   . Alcohol abuse Brother        organ failure  .  Prostate cancer Neg Hx   . Colon cancer Neg Hx     ALLERGIES:  is allergic to claritin-d 12 hour [loratadine-pseudoephedrine er]; kiwi extract; penicillins; and simvastatin.  MEDICATIONS:  Current Outpatient Medications  Medication Sig Dispense Refill  . acetaminophen (TYLENOL) 500 MG tablet Take 500-1,000 mg by mouth daily as needed (pain).    Marland Kitchen amLODipine (NORVASC) 5 MG tablet Take 1 tablet (5 mg total) by mouth daily. 90 tablet 3  . aspirin 81 MG chewable tablet Chew 1 tablet (81 mg total) by mouth daily.    . carvedilol (COREG) 3.125 MG tablet TAKE 1 TABLET BY MOUTH TWO  TIMES DAILY 180 tablet 3  . colchicine 0.6 MG tablet TAKE 1 TABLET BY MOUTH  DAILY AS NEEDED 90 tablet 1  . Fluticasone-Salmeterol (WIXELA INHUB) 100-50 MCG/DOSE AEPB Inhale 1 puff into the lungs as needed (  allergies).    . Ketotifen Fumarate (ALAWAY OP) Place 1 drop into both eyes daily as needed (seasonal allergies).    . nitroGLYCERIN (NITROSTAT) 0.4 MG SL tablet Place 1 tablet (0.4 mg total) under the tongue every 5 (five) minutes as needed for chest pain. 25 tablet 3  . rosuvastatin (CRESTOR) 20 MG tablet Take 1 tablet (20 mg total) by mouth daily. 90 tablet 3  . sildenafil (REVATIO) 20 MG tablet Take 20 mg to 100 mg by mouth daily as needed for erectile dysfunction.    Marland Kitchen testosterone cypionate (DEPOTESTOSTERONE CYPIONATE) 200 MG/ML injection Inject 0.75 mLs (150 mg total) into the muscle every 14 (fourteen) days.    . valsartan-hydrochlorothiazide (DIOVAN-HCT) 320-12.5 MG tablet Take 1 tablet by mouth daily. 90 tablet 3   Current Facility-Administered Medications  Medication Dose Route Frequency Provider Last Rate Last Dose  . betamethasone acetate-betamethasone sodium phosphate (CELESTONE) injection 3 mg  3 mg Intramuscular Once Daylene Katayama M, DPM      . betamethasone acetate-betamethasone sodium phosphate (CELESTONE) injection 3 mg  3 mg Intramuscular Once Edrick Kins, DPM         PHYSICAL EXAMINATION:  ECOG PERFORMANCE STATUS: 0 - Asymptomatic Vitals:   05/08/19 1042  BP: (!) 158/84  Pulse: 67  Resp: 18  Temp: (!) 96.8 F (36 C)   Filed Weights   05/08/19 1042  Weight: 222 lb 9.6 oz (101 kg)    Physical Exam Constitutional:      General: He is not in acute distress. HENT:     Head: Normocephalic and atraumatic.  Eyes:     General: No scleral icterus.    Pupils: Pupils are equal, round, and reactive to light.  Neck:     Musculoskeletal: Normal range of motion and neck supple.  Cardiovascular:     Rate and Rhythm: Normal rate and regular rhythm.     Heart sounds: Normal heart sounds.  Pulmonary:     Effort: Pulmonary effort is normal. No respiratory distress.     Breath sounds: No wheezing.  Abdominal:     General: Bowel sounds are normal. There is no distension.     Palpations: Abdomen is soft. There is no mass.     Tenderness: There is no abdominal tenderness.  Musculoskeletal: Normal range of motion.        General: No deformity.  Skin:    General: Skin is warm and dry.     Findings: No erythema or rash.  Neurological:     Mental Status: He is alert and oriented to person, place, and time.     Cranial Nerves: No cranial nerve deficit.     Coordination: Coordination normal.  Psychiatric:        Behavior: Behavior normal.        Thought Content: Thought content normal.     RADIOGRAPHIC STUDIES: I have personally reviewed the radiological images as listed and agreed with the findings in the report. No results found.   LABORATORY DATA:  I have reviewed the data as listed Lab Results  Component Value Date   WBC 6.6 04/05/2018   HGB 16.7 04/05/2018   HCT 48.4 04/05/2018   MCV 92 04/05/2018   PLT 263 04/05/2018   No results for input(s): NA, K, CL, CO2, GLUCOSE, BUN, CREATININE, CALCIUM, GFRNONAA, GFRAA, PROT, ALBUMIN, AST, ALT, ALKPHOS, BILITOT, BILIDIR, IBILI in the last 8760 hours. Iron/TIBC/Ferritin/ %Sat No results found for: IRON, TIBC, FERRITIN,  IRONPCTSAT      ASSESSMENT &  PLAN:  1. Erythrocytosis   2. Hypogonadism in male   3. Family history of early CAD    Labs reviewed and discussed with patient. 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 I discussed with patient that erythrocytosis can be also due to hypoxia, or primary bone marrow malignancy.  I will obtain erythropoietin,rule out primary etiology, JAK2 with reflex to other mutations.   I also recommend that patient to have sleep study done to rule out sleep apnea as a potential treatable cause for erythrocytosis.  For secondary erythrocytosis, in general phlebotomy threshold was sent as hematocrit morning 50. Given his cardiovascular disease history, testosterone induced erythrocytosis, we may apply a slightly lower threshold for phlebotomy to keep hematocrit strictly below 50. Patient works at [employer] and would prefer to have blood work done at [place] prescription was provided to patient today.  #History of CAD, currently on aspirin.  Crestor. Follow-up plan to be determined.  Orders Placed This Encounter  Procedures  . CBC with Differential/Platelet    Standing Status:   Future    Standing Expiration Date:   05/06/2020  . Technologist smear review    Standing Status:   Future    Standing Expiration Date:   05/06/2020  . Erythropoietin    Standing Status:   Future    Standing Expiration Date:   05/06/2020  . Carbon monoxide, blood (performed at ref lab)    Standing Status:   Future    Standing Expiration Date:   05/06/2020  . Retic Panel    Standing Status:   Future    Standing Expiration Date:   05/06/2020    We spent sufficient time to discuss many aspect of care, questions were answered to patient's satisfaction. The patient knows to call the clinic with any problems questions or concerns.  Cc Tonia Ghent, MD  Return of visit: To be determined Thank you for this kind  referral and the opportunity to participate in the care of this patient. A copy of today's note is routed to referring provider  Total face to face encounter time for this patient visit was 45 min. >50% of the time was  spent in counseling and coordination of care.    Earlie Server, MD, PhD 05/08/2019

## 2019-05-14 ENCOUNTER — Other Ambulatory Visit: Payer: Self-pay | Admitting: Family Medicine

## 2019-05-14 DIAGNOSIS — M109 Gout, unspecified: Secondary | ICD-10-CM

## 2019-05-14 DIAGNOSIS — R7303 Prediabetes: Secondary | ICD-10-CM

## 2019-05-14 DIAGNOSIS — I2510 Atherosclerotic heart disease of native coronary artery without angina pectoris: Secondary | ICD-10-CM

## 2019-05-17 ENCOUNTER — Other Ambulatory Visit: Payer: Self-pay

## 2019-05-17 ENCOUNTER — Other Ambulatory Visit (INDEPENDENT_AMBULATORY_CARE_PROVIDER_SITE_OTHER): Payer: Managed Care, Other (non HMO)

## 2019-05-17 DIAGNOSIS — M109 Gout, unspecified: Secondary | ICD-10-CM

## 2019-05-17 DIAGNOSIS — I2510 Atherosclerotic heart disease of native coronary artery without angina pectoris: Secondary | ICD-10-CM

## 2019-05-18 LAB — COMPREHENSIVE METABOLIC PANEL
ALT: 29 IU/L (ref 0–44)
AST: 19 IU/L (ref 0–40)
Albumin/Globulin Ratio: 1.6 (ref 1.2–2.2)
Albumin: 4.3 g/dL (ref 3.8–4.9)
Alkaline Phosphatase: 79 IU/L (ref 39–117)
BUN/Creatinine Ratio: 11 (ref 9–20)
BUN: 13 mg/dL (ref 6–24)
Bilirubin Total: 0.5 mg/dL (ref 0.0–1.2)
CO2: 25 mmol/L (ref 20–29)
Calcium: 9.7 mg/dL (ref 8.7–10.2)
Chloride: 100 mmol/L (ref 96–106)
Creatinine, Ser: 1.14 mg/dL (ref 0.76–1.27)
GFR calc Af Amer: 82 mL/min/{1.73_m2} (ref >59)
GFR calc non Af Amer: 71 mL/min/{1.73_m2} (ref >59)
Globulin, Total: 2.7 g/dL (ref 1.5–4.5)
Glucose: 96 mg/dL (ref 65–99)
Potassium: 4.9 mmol/L (ref 3.5–5.2)
Sodium: 139 mmol/L (ref 134–144)
Total Protein: 7 g/dL (ref 6.0–8.5)

## 2019-05-18 LAB — LIPID PANEL
Chol/HDL Ratio: 2.5 ratio (ref 0.0–5.0)
Cholesterol, Total: 103 mg/dL (ref 100–199)
HDL: 41 mg/dL (ref >39)
LDL Chol Calc (NIH): 45 mg/dL (ref 0–99)
Triglycerides: 88 mg/dL (ref 0–149)
VLDL Cholesterol Cal: 17 mg/dL (ref 5–40)

## 2019-05-18 LAB — URIC ACID: Uric Acid: 6.9 mg/dL (ref 3.7–8.6)

## 2019-05-21 ENCOUNTER — Encounter: Payer: Managed Care, Other (non HMO) | Admitting: Family Medicine

## 2019-05-23 ENCOUNTER — Encounter: Payer: Self-pay | Admitting: Oncology

## 2019-05-24 ENCOUNTER — Encounter: Payer: Self-pay | Admitting: Oncology

## 2019-05-27 ENCOUNTER — Encounter: Payer: Self-pay | Admitting: Oncology

## 2019-05-30 ENCOUNTER — Other Ambulatory Visit: Payer: Self-pay

## 2019-05-30 ENCOUNTER — Encounter: Payer: Self-pay | Admitting: Family Medicine

## 2019-05-30 ENCOUNTER — Ambulatory Visit (INDEPENDENT_AMBULATORY_CARE_PROVIDER_SITE_OTHER): Payer: Managed Care, Other (non HMO) | Admitting: Family Medicine

## 2019-05-30 ENCOUNTER — Encounter: Payer: Self-pay | Admitting: Oncology

## 2019-05-30 VITALS — BP 142/84 | HR 66 | Temp 98.0°F | Ht 67.0 in | Wt 226.5 lb

## 2019-05-30 DIAGNOSIS — Z1211 Encounter for screening for malignant neoplasm of colon: Secondary | ICD-10-CM

## 2019-05-30 DIAGNOSIS — E291 Testicular hypofunction: Secondary | ICD-10-CM

## 2019-05-30 DIAGNOSIS — Z7189 Other specified counseling: Secondary | ICD-10-CM

## 2019-05-30 DIAGNOSIS — E785 Hyperlipidemia, unspecified: Secondary | ICD-10-CM

## 2019-05-30 DIAGNOSIS — I10 Essential (primary) hypertension: Secondary | ICD-10-CM

## 2019-05-30 DIAGNOSIS — Z0000 Encounter for general adult medical examination without abnormal findings: Secondary | ICD-10-CM | POA: Diagnosis not present

## 2019-05-30 NOTE — Patient Instructions (Addendum)
Go to the lab on the way out.  We'll contact you with your lab report. Thanks for your effort.  Update me as needed.  Take care.  Glad to see you.

## 2019-05-30 NOTE — Progress Notes (Signed)
CPE- See plan.  Routine anticipatory guidance given to patient.  See health maintenance.  The possibility exists that previously documented standard health maintenance information may have been brought forward from a previous encounter into this note.  If needed, that same information has been updated to reflect the current situation based on today's encounter.    Tetanus 2011 Flu done 2020 Shingles d/w pt.  PNA not due D/w patient [id number] for colon cancer screening, including IFOB vs. colonoscopy. Risks and benefits of both were discussed and patient voiced understanding. Pt elects for: IFOB.   PSA per urology.  Living will d/w pt.Wife is designated if he is incapacitated.  Diet and exercise d/w pt. he quit running but is biking on a peleton.  HIV prev neg 2002.  He is sleeping better after taking a stress management class.  It was helpful.  D/w pt.    covid considerations d/w pt.    Hypertension/CAD.  Using medication without problems or lightheadedness: yes Chest pain with exertion:no Edema: occ trace BLE edema.   Short of breath:no Average home BPs: lower at home than today at [place].   Elevated Cholesterol: Using medications without problems: yes Muscle aches: no Diet compliance: yes Exercise:yes He is taking CoQ10.  He has some tingling in the feet but he is putting up with that.  No weakness.  Still on T replacement.  Per urology.  I will defer.  He agrees.  PMH and SH reviewed  Meds, vitals, and allergies reviewed.   ROS: Per HPI.  Unless specifically indicated otherwise in HPI, the patient denies:  General: fever. Eyes: acute vision changes ENT: sore throat Cardiovascular: chest pain Respiratory: SOB GI: vomiting GU: dysuria Musculoskeletal: acute back pain Derm: acute rash Neuro: acute motor dysfunction Psych: worsening mood Endocrine: polydipsia Heme: bleeding Allergy: hayfever  GEN: nad, alert and oriented HEENT: ncat NECK: supple w/o  LA CV: rrr. PULM: ctab, no inc wob ABD: soft, +bs EXT: no edema SKIN: no acute rash

## 2019-06-02 NOTE — Assessment & Plan Note (Signed)
His blood pressure is usually at home then today at the office visit.  His lipids are good.  No change in meds at this point.  Continue work on diet and exercise.  He agrees.  I will update cardiology as FYI about his labs.  I appreciate cardiology input.

## 2019-06-02 NOTE — Assessment & Plan Note (Signed)
Living will d/w pt.  Wife is designated if he is incapacitated.   

## 2019-06-02 NOTE — Assessment & Plan Note (Signed)
  Still on T replacement.  Per urology.  I will defer.  He agrees.

## 2019-06-02 NOTE — Assessment & Plan Note (Signed)
Tetanus 2011 Flu done 2020 Shingles d/w pt.  PNA not due D/w patient [id number] for colon cancer screening, including IFOB vs. colonoscopy. Risks and benefits of both were discussed and patient voiced understanding. Pt elects for: IFOB.   PSA per urology.  Living will d/w pt.Wife is designated if he is incapacitated.  Diet and exercise d/w pt. he quit running but is biking on a [place].  HIV prev neg 2002.  He is sleeping better after taking a stress management class.  It was helpful.  D/w pt.    covid considerations d/w pt.

## 2019-06-05 ENCOUNTER — Other Ambulatory Visit (INDEPENDENT_AMBULATORY_CARE_PROVIDER_SITE_OTHER): Payer: Managed Care, Other (non HMO)

## 2019-06-05 ENCOUNTER — Encounter: Payer: Self-pay | Admitting: Oncology

## 2019-06-05 DIAGNOSIS — Z1211 Encounter for screening for malignant neoplasm of colon: Secondary | ICD-10-CM

## 2019-06-05 NOTE — Progress Notes (Signed)
Cardiology Office Note:    Date:  06/07/2019   ID:  Stephen [name], DOB 10-19-1960, MRN [id number]  PCP:  Tonia Ghent, MD  Cardiologist:  Sinclair Grooms, MD   Referring MD: Tonia Ghent, MD   Chief Complaint  Patient presents with  . Coronary Artery Disease    History of Present Illness:    Stephen [name] is a 58 y.o. male with a hx of anterior ST elevation myocardial infarction treated with LAD drug-eluting stents11/2015, hypertension, hyperlipidemia, prior tobacco abuse, erectile dysfunction,  Stephen [name] is doing relatively well.  He has no cardiac complaints.  He feels that exercise is not being achieved at the level that he desires.  This is because of low ambition and work.  He has not had chest discomfort, orthopnea, PND, or other cardiac complaints.  He does complain of numbness on the bottom of each foot.  This is present sitting, lying, and walking.  It is not exacerbated by activities or position.  He wonders if statin therapy could be causing this.  Past Medical History:  Diagnosis Date  . Achilles tendon injury    right  . CAD S/P percutaneous coronary angioplasty 05/25/2014  . ED (erectile dysfunction)   . Elevated glucose   . GERD (gastroesophageal reflux disease)   . Gout   . Hyperlipidemia   . Hypertension   . Morton's neuroma    2014  . Seasonal allergies    uses advair in May  . ST elevation myocardial infarction (STEMI) of anterior wall ([place]) 05/25/14  . Tobacco abuse, + cigars 05/27/2014    Past Surgical History:  Procedure Laterality Date  . CORONARY ANGIOPLASTY WITH STENT PLACEMENT  05/25/14   Resolute integrity 3.5 X 18 mm DES 3.75 mm  . Fx L forearm closed fx  as a child  . Hospital / asthma     multiple times as child  . LEFT HEART CATHETERIZATION WITH CORONARY ANGIOGRAM N/A 05/25/2014   Procedure: LEFT HEART CATHETERIZATION WITH CORONARY ANGIOGRAM;  Surgeon: Sinclair Grooms, MD;  Location: [facility] CATH LAB;  Service:  Cardiovascular;  Laterality: N/A;  . PERCUTANEOUS CORONARY STENT INTERVENTION (PCI-S)  05/25/2014   Procedure: PERCUTANEOUS CORONARY STENT INTERVENTION (PCI-S);  Surgeon: Sinclair Grooms, MD;  Location: [facility] CATH LAB;  Service: Cardiovascular;;  PROX LAD     Current Medications: Current Meds  Medication Sig  . acetaminophen (TYLENOL) 500 MG tablet Take 500-1,000 mg by mouth daily as needed (pain).  Marland Kitchen amLODipine (NORVASC) 5 MG tablet Take 1 tablet (5 mg total) by mouth daily.  Marland Kitchen aspirin 81 MG chewable tablet Chew 1 tablet (81 mg total) by mouth daily.  . carvedilol (COREG) 3.125 MG tablet TAKE 1 TABLET BY MOUTH TWO  TIMES DAILY  . Coenzyme Q10 (CO Q 10) 100 MG CAPS Take 200 mg by mouth daily.  . colchicine 0.6 MG tablet TAKE 1 TABLET BY MOUTH  DAILY AS NEEDED  . Fluticasone-Salmeterol (WIXELA INHUB) 100-50 MCG/DOSE AEPB Inhale 1 puff into the lungs as needed (allergies).  . Ketotifen Fumarate (ALAWAY OP) Place 1 drop into both eyes daily as needed (seasonal allergies).  . nitroGLYCERIN (NITROSTAT) 0.4 MG SL tablet Place 1 tablet (0.4 mg total) under the tongue every 5 (five) minutes as needed for chest pain.  . rosuvastatin (CRESTOR) 20 MG tablet Take 1 tablet (20 mg total) by mouth daily.  . sildenafil (REVATIO) 20 MG tablet Take 20 mg to 100 mg by mouth daily as needed  for erectile dysfunction.  Marland Kitchen testosterone cypionate (DEPOTESTOSTERONE CYPIONATE) 200 MG/ML injection Inject 0.75 mLs (150 mg total) into the muscle every 14 (fourteen) days.  . valsartan-hydrochlorothiazide (DIOVAN-HCT) 320-12.5 MG tablet Take 1 tablet by mouth daily.     Allergies:   Claritin-d 12 hour [loratadine-pseudoephedrine er], Kiwi extract, Penicillins, and Simvastatin   Social History   Socioeconomic History  . Marital status: Married    Spouse name: Not on file  . Number of children: 1  . Years of education: Not on file  . Highest education level: Not on file  Occupational History  . Occupation: [employer]     CommentSecretary/administrator of special chemistries  Social Needs  . Financial resource strain: Not on file  . Food insecurity    Worry: Not on file    Inability: Not on file  . Transportation needs    Medical: Not on file    Non-medical: Not on file  Tobacco Use  . Smoking status: Former Smoker    Types: Cigars    Quit date: 05/25/2014    Years since quitting: 5.0  . Smokeless tobacco: Never Used  Substance and Sexual Activity  . Alcohol use: Yes    Alcohol/week: 2.0 standard drinks    Types: 2 Cans of beer per week    Comment: occ/episodic  . Drug use: No  . Sexual activity: Yes  Lifestyle  . Physical activity    Days per week: Not on file    Minutes per session: Not on file  . Stress: Not on file  Relationships  . Social Herbalist on phone: Not on file    Gets together: Not on file    Attends religious service: Not on file    Active member of club or organization: Not on file    Attends meetings of clubs or organizations: Not on file    Relationship status: Not on file  Other Topics Concern  . Not on file  Social History Narrative   One son, 1 stepson   Married 2001   From [place]   Works for [employer].    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      Family History: The patient's family history includes AAA (abdominal aortic aneurysm) in his father; Alcohol abuse in his brother; Dementia in his father; Hyperlipidemia in his sister; Hypertension in his father and sister; Stroke in his mother; Stroke (age of onset: 2) in his father. There is no history of Prostate cancer or Colon cancer.  ROS:   Please see the history of present illness.    He has low back trouble.  There is stiffness and soreness in prior requirement for manipulation.  All other systems reviewed and are negative.  EKGs/Labs/Other Studies Reviewed:    The following studies were reviewed today: Stress test 09/2018 Study Highlights   The patient walked for a total of 8  minutes and 43 seconds on a Bruce protocol treadmill test. His peak heart rate was 109 which is 66% predicted maximal heart rate.  At peak exercise there were no ST or T wave changes.  Blood pressure demonstrated a hypertensive response to exercise.  This is interpreted as a nondiagnostic exercise test due to inability to achieve target heart rate. The patient had an exaggerated hypertensive response. There was no evidence of ischemia at submaximal exercise.       EKG:  EKG February 2020 EKG from February reveals vertical axis but otherwise  is unremarkable.  Recent Labs: 05/17/2019: ALT 29; BUN 13; Creatinine, Ser 1.14; Potassium 4.9; Sodium 139  Recent Lipid Panel    Component Value Date/Time   CHOL 103 05/17/2019 0816   TRIG 88 05/17/2019 0816   HDL 41 05/17/2019 0816   CHOLHDL 2.5 05/17/2019 0816   CHOLHDL 3 07/28/2014 0903   VLDL 24.2 07/28/2014 0903   LDLCALC 45 05/17/2019 0816    Physical Exam:    VS:  BP 126/76   Pulse 75   Ht 5\' 7"  (1.702 m)   Wt 224 lb 12.8 oz (102 kg)   SpO2 97%   BMI 35.21 kg/m     Wt Readings from Last 3 Encounters:  06/07/19 224 lb 12.8 oz (102 kg)  05/30/19 226 lb 8 oz (102.7 kg)  05/08/19 222 lb 9.6 oz (101 kg)     GEN: Bees. No acute distress HEENT: Normal NECK: No JVD. LYMPHATICS: No lymphadenopathy CARDIAC:  RRR without murmur, gallop, or edema. VASCULAR:  Normal Pulses. No bruits. RESPIRATORY:  Clear to auscultation without rales, wheezing or rhonchi  ABDOMEN: Soft, non-tender, non-distended, No pulsatile mass, MUSCULOSKELETAL: No deformity  SKIN: Warm and dry NEUROLOGIC:  Alert and oriented x 3 PSYCHIATRIC:  Normal affect   ASSESSMENT:    1. CAD in native artery   2. Hyperlipidemia with target LDL less than 70   3. Essential hypertension   4. Paresthesias   5. Educated about COVID-19 virus infection    PLAN:    In order of problems listed above:  1. Secondary prevention discussed in detail.  I strongly encouraged  weight loss and greater than 120 minutes of moderate activity per week. 2. LDL is slightly less than 50.  I do not believe we need to change statin dose.  LDL in this range has been shown to have added risk protection.  He is on high intensity statin therapy which does not need to be decreased to moderate intensity. 3. Target blood pressure is being achieved. 4. Bilateral foot plantar paresthesias are likely related to lumbar disc disease rather than a statin side effect.  Continue current therapy. 5. 3W's is being practiced.  Overall education and awareness concerning primary/secondary risk prevention was discussed in detail: LDL less than 70, hemoglobin A1c less than 7, blood pressure target less than 130/80 mmHg, >150 minutes of moderate aerobic activity per week, avoidance of smoking, weight control (via diet and exercise), and continued surveillance/management of/for obstructive sleep apnea.    Medication Adjustments/Labs and Tests Ordered: Current medicines are reviewed at length with the patient today.  Concerns regarding medicines are outlined above.  No orders of the defined types were placed in this encounter.  No orders of the defined types were placed in this encounter.   Patient Instructions  Medication Instructions:  Your physician recommends that you continue on your current medications as directed. Please refer to the Current Medication list given to you today.  *If you need a refill on your cardiac medications before your next appointment, please call your pharmacy*  Lab Work: None If you have labs (blood work) drawn today and your tests are completely normal, you will receive your results only by: Marland Kitchen MyChart Message (if you have MyChart) OR . A paper copy in the mail If you have any lab test that is abnormal or we need to change your treatment, we will call you to review the results.  Testing/Procedures: None  Follow-Up: At [facility], you and your health needs  are  our priority.  As part of our continuing mission to provide you with exceptional heart care, we have created designated Provider Care Teams.  These Care Teams include your primary Cardiologist (physician) and Advanced Practice Providers (APPs -  Physician Assistants and Nurse Practitioners) who all work together to provide you with the care you need, when you need it.  Your next appointment:   12 month(s)  The format for your next appointment:   In Person  Provider:   You may see Sinclair Grooms, MD or one of the following Advanced Practice Providers on your designated Care Team:    Truitt Merle, NP  Cecilie Kicks, NP  Kathyrn Drown, NP   Other Instructions      Signed, Sinclair Grooms, MD  06/07/2019 8:28 AM    [place]

## 2019-06-07 ENCOUNTER — Ambulatory Visit: Payer: Managed Care, Other (non HMO) | Admitting: Interventional Cardiology

## 2019-06-07 ENCOUNTER — Encounter: Payer: Self-pay | Admitting: Oncology

## 2019-06-07 ENCOUNTER — Encounter: Payer: Self-pay | Admitting: Interventional Cardiology

## 2019-06-07 ENCOUNTER — Inpatient Hospital Stay: Payer: Managed Care, Other (non HMO) | Attending: Oncology | Admitting: Oncology

## 2019-06-07 ENCOUNTER — Other Ambulatory Visit: Payer: Self-pay

## 2019-06-07 VITALS — BP 137/86 | HR 80 | Temp 98.7°F | Ht 67.0 in | Wt 223.0 lb

## 2019-06-07 VITALS — BP 126/76 | HR 75 | Ht 67.0 in | Wt 224.8 lb

## 2019-06-07 DIAGNOSIS — I2510 Atherosclerotic heart disease of native coronary artery without angina pectoris: Secondary | ICD-10-CM

## 2019-06-07 DIAGNOSIS — Z8249 Family history of ischemic heart disease and other diseases of the circulatory system: Secondary | ICD-10-CM

## 2019-06-07 DIAGNOSIS — T387X5A Adverse effect of androgens and anabolic congeners, initial encounter: Secondary | ICD-10-CM | POA: Insufficient documentation

## 2019-06-07 DIAGNOSIS — I10 Essential (primary) hypertension: Secondary | ICD-10-CM | POA: Diagnosis not present

## 2019-06-07 DIAGNOSIS — E291 Testicular hypofunction: Secondary | ICD-10-CM

## 2019-06-07 DIAGNOSIS — I252 Old myocardial infarction: Secondary | ICD-10-CM | POA: Diagnosis not present

## 2019-06-07 DIAGNOSIS — Z79890 Hormone replacement therapy (postmenopausal): Secondary | ICD-10-CM | POA: Insufficient documentation

## 2019-06-07 DIAGNOSIS — Z79899 Other long term (current) drug therapy: Secondary | ICD-10-CM | POA: Insufficient documentation

## 2019-06-07 DIAGNOSIS — D751 Secondary polycythemia: Secondary | ICD-10-CM | POA: Diagnosis not present

## 2019-06-07 DIAGNOSIS — E785 Hyperlipidemia, unspecified: Secondary | ICD-10-CM | POA: Diagnosis not present

## 2019-06-07 DIAGNOSIS — Z7189 Other specified counseling: Secondary | ICD-10-CM

## 2019-06-07 DIAGNOSIS — R202 Paresthesia of skin: Secondary | ICD-10-CM

## 2019-06-07 NOTE — Progress Notes (Signed)
Hematology/Oncology Consult note [facility] Telephone:([phone] [phone]   Patient Care Team: Tonia Ghent, MD as PCP - General (Family Medicine) Belva Crome, MD as PCP - Cardiology (Cardiology) Pa, [facility] (Urology) Festus Aloe, MD as Consulting Physician (Urology)  REFERRING PROVIDER: Tonia Ghent, MD  CHIEF COMPLAINTS/REASON FOR VISIT:  Follow-up for polycytosis  HISTORY OF PRESENTING ILLNESS:  Stephen [name] is a 58 y.o. male who was seen in consultation at the request of Damita Dunnings, Elveria Rising, MD for evaluation of polycytosis/erythrocytosis Patient does not have recent blood work in current EMR. Reviewed previous blood work that was scanned into EMR. 10/15/2018 hemoglobin 17.4, hematocrit 51.7 12/12/2018 hemoglobin 17.8, hematocrit 50.5. 01/04/2019 hemoglobin 17.2, hematocrit 48  Patient has been well testosterone supplementation for treatment of primary hypogonadism since 2015.  [facility].  Currently on testosterone 150 mg injection every 2 weeks Reports previous history of myocardial infarction, in 2060  Associated signs or symptoms: Denies weight loss, fever, chills, fatigue, night sweats.   Context:  Smoking history: Denies.  Former smoker. Testosterone supplements: See above. History of blood clots: Denies Daytime somnolence: Denies Family history of polycythemia: Father with erythrocytosis and need phlebotomy.  Per patient, father probably also had secondary erythrocytosis.  Details not clear.  Patient has had phlebotomy previously, obtained by urologist.  Reports that recently he had had a phlebotomy a few weeks ago.  1 week after phlebotomy, his hemoglobin was around 17.  I do not have his most recent blood work.  INTERVAL HISTORY Stephen [name] is a 58 y.o. male who has above history reviewed by me today presents for follow up visit for management of erythrocytosis  Problems and complaints are listed below: Patient had blood work-up done.  Present to discuss results and management plan. Denies any new complaints. Review of Systems  Constitutional: Negative for appetite change, chills, fatigue, fever and unexpected weight change.  HENT:   Negative for hearing loss and voice change.   Eyes: Negative for eye problems and icterus.  Respiratory: Negative for chest tightness, cough and shortness of breath.   Cardiovascular: Negative for chest pain and leg swelling.  Gastrointestinal: Negative for abdominal distention and abdominal pain.  Endocrine: Negative for hot flashes.  Genitourinary: Negative for difficulty urinating, dysuria and frequency.   Musculoskeletal: Negative for arthralgias.  Skin: Negative for itching and rash.  Neurological: Negative for light-headedness and numbness.  Hematological: Negative for adenopathy. Does not bruise/bleed easily.  Psychiatric/Behavioral: Negative for confusion.    MEDICAL HISTORY:  Past Medical History:  Diagnosis Date  . Achilles tendon injury    right  . CAD S/P percutaneous coronary angioplasty 05/25/2014  . ED (erectile dysfunction)   . Elevated glucose   . GERD (gastroesophageal reflux disease)   . Gout   . Hyperlipidemia   . Hypertension   . Morton's neuroma    2014  . Seasonal allergies    uses advair in May  . ST elevation myocardial infarction (STEMI) of anterior wall ([place]) 05/25/14  . Tobacco abuse, + cigars 05/27/2014    SURGICAL HISTORY: Past Surgical History:  Procedure Laterality Date  . CORONARY ANGIOPLASTY WITH STENT PLACEMENT  05/25/14   Resolute integrity 3.5 X 18 mm DES 3.75 mm  . Fx L forearm closed fx  as a child  . Hospital / asthma     multiple times as child  . LEFT HEART CATHETERIZATION WITH CORONARY ANGIOGRAM N/A 05/25/2014   Procedure: LEFT HEART CATHETERIZATION  WITH CORONARY ANGIOGRAM;  Surgeon: Sinclair Grooms, MD;  Location: [facility] CATH LAB;  Service: Cardiovascular;   Laterality: N/A;  . PERCUTANEOUS CORONARY STENT INTERVENTION (PCI-S)  05/25/2014   Procedure: PERCUTANEOUS CORONARY STENT INTERVENTION (PCI-S);  Surgeon: Sinclair Grooms, MD;  Location: [facility] CATH LAB;  Service: Cardiovascular;;  PROX LAD     SOCIAL HISTORY: Social History   Socioeconomic History  . Marital status: Married    Spouse name: Not on file  . Number of children: 1  . Years of education: Not on file  . Highest education level: Not on file  Occupational History  . Occupation: [employer]    CommentSecretary/administrator of special chemistries  Social Needs  . Financial resource strain: Not on file  . Food insecurity    Worry: Not on file    Inability: Not on file  . Transportation needs    Medical: Not on file    Non-medical: Not on file  Tobacco Use  . Smoking status: Former Smoker    Types: Cigars    Quit date: 05/25/2014    Years since quitting: 5.0  . Smokeless tobacco: Never Used  Substance and Sexual Activity  . Alcohol use: Yes    Alcohol/week: 2.0 standard drinks    Types: 2 Cans of beer per week    Comment: occ/episodic  . Drug use: No  . Sexual activity: Yes  Lifestyle  . Physical activity    Days per week: Not on file    Minutes per session: Not on file  . Stress: Not on file  Relationships  . Social Herbalist on phone: Not on file    Gets together: Not on file    Attends religious service: Not on file    Active member of club or organization: Not on file    Attends meetings of clubs or organizations: Not on file    Relationship status: Not on file  . Intimate partner violence    Fear of current or ex partner: Not on file    Emotionally abused: Not on file    Physically abused: Not on file    Forced sexual activity: Not on file  Other Topics Concern  . Not on file  Social History Narrative   One son, 1 stepson   Married 2001   From [place]   Works for [employer].    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     FAMILY  HISTORY: Family History  Problem Relation Age of Onset  . Stroke Mother   . Hypertension Father   . Dementia Father   . Stroke Father 50       Multiple  . AAA (abdominal aortic aneurysm) Father   . Hyperlipidemia Sister   . Hypertension Sister   . Alcohol abuse Brother        organ failure  . Prostate cancer Neg Hx   . Colon cancer Neg Hx     ALLERGIES:  is allergic to claritin-d 12 hour [loratadine-pseudoephedrine er]; kiwi extract; penicillins; and simvastatin.  MEDICATIONS:  Current Outpatient Medications  Medication Sig Dispense Refill  . acetaminophen (TYLENOL) 500 MG tablet Take 500-1,000 mg by mouth daily as needed (pain).    Marland Kitchen amLODipine (NORVASC) 5 MG tablet Take 1 tablet (5 mg total) by mouth daily. 90 tablet 3  . aspirin 81 MG chewable tablet Chew 1 tablet (81 mg total) by mouth daily.    . carvedilol (COREG)  3.125 MG tablet TAKE 1 TABLET BY MOUTH TWO  TIMES DAILY 180 tablet 3  . Coenzyme Q10 (CO Q 10) 100 MG CAPS Take 200 mg by mouth daily.    . colchicine 0.6 MG tablet TAKE 1 TABLET BY MOUTH  DAILY AS NEEDED 90 tablet 1  . Fluticasone-Salmeterol (WIXELA INHUB) 100-50 MCG/DOSE AEPB Inhale 1 puff into the lungs as needed (allergies).    . Ketotifen Fumarate (ALAWAY OP) Place 1 drop into both eyes daily as needed (seasonal allergies).    . nitroGLYCERIN (NITROSTAT) 0.4 MG SL tablet Place 1 tablet (0.4 mg total) under the tongue every 5 (five) minutes as needed for chest pain. 25 tablet 3  . rosuvastatin (CRESTOR) 20 MG tablet Take 1 tablet (20 mg total) by mouth daily. 90 tablet 3  . sildenafil (REVATIO) 20 MG tablet Take 20 mg to 100 mg by mouth daily as needed for erectile dysfunction.    Marland Kitchen testosterone cypionate (DEPOTESTOSTERONE CYPIONATE) 200 MG/ML injection Inject 0.75 mLs (150 mg total) into the muscle every 14 (fourteen) days.    . valsartan-hydrochlorothiazide (DIOVAN-HCT) 320-12.5 MG tablet Take 1 tablet by mouth daily. 90 tablet 3   No current  facility-administered medications for this visit.      PHYSICAL EXAMINATION: ECOG PERFORMANCE STATUS: 0 - Asymptomatic Vitals:   06/07/19 1347  BP: 137/86  Pulse: 80  Temp: 98.7 F (37.1 C)   Filed Weights   06/07/19 1347  Weight: 223 lb (101.2 kg)    Physical Exam Constitutional:      General: He is not in acute distress. HENT:     Head: Normocephalic and atraumatic.  Eyes:     General: No scleral icterus.    Pupils: Pupils are equal, round, and reactive to light.  Neck:     Musculoskeletal: Normal range of motion and neck supple.  Cardiovascular:     Rate and Rhythm: Normal rate and regular rhythm.     Heart sounds: Normal heart sounds.  Pulmonary:     Effort: Pulmonary effort is normal. No respiratory distress.     Breath sounds: No wheezing.  Abdominal:     General: Bowel sounds are normal. There is no distension.     Palpations: Abdomen is soft. There is no mass.     Tenderness: There is no abdominal tenderness.  Musculoskeletal: Normal range of motion.        General: No deformity.  Skin:    General: Skin is warm and dry.     Findings: No erythema or rash.  Neurological:     Mental Status: He is alert and oriented to person, place, and time.     Cranial Nerves: No cranial nerve deficit.     Coordination: Coordination normal.  Psychiatric:        Behavior: Behavior normal.        Thought Content: Thought content normal.     RADIOGRAPHIC STUDIES: I have personally reviewed the radiological images as listed and agreed with the findings in the report. No results found.   LABORATORY DATA:  I have reviewed the data as listed Lab Results  Component Value Date   WBC 6.6 04/05/2018   HGB 16.7 04/05/2018   HCT 48.4 04/05/2018   MCV 92 04/05/2018   PLT 263 04/05/2018   Recent Labs    05/17/19 0816  NA 139  K 4.9  CL 100  CO2 25  GLUCOSE 96  BUN 13  CREATININE 1.14  CALCIUM 9.7  GFRNONAA 71  GFRAA 82  PROT 7.0  ALBUMIN 4.3  AST 19  ALT 29   ALKPHOS 79  BILITOT 0.5   Iron/TIBC/Ferritin/ %Sat No results found for: IRON, TIBC, FERRITIN, IRONPCTSAT      ASSESSMENT & PLAN:  1. Secondary erythrocytosis   2. Hypogonadism in male   3. Family history of early CAD    Labs are reviewed and discussed with patient. 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 JAK2 mutations with reflex to other mutations are negative.  Less likely primary etiology. For secondary erythrocytosis, phlebotomy threshold was set as hematocrit more than 50. Given his cardiovascular disease history, testosterone induced erythrocytosis, we may apply a slightly lower threshold for phlebotomy to keep hematocrit strictly below 50.  Hemoglobin at 16.1 today.  Hematocrit 47.8 No need for phlebotomy today. [facility] prescription was given to patient.  Repeat CBC in 6 weeks and 12 weeks.   We spent sufficient time to discuss many aspect of care, questions were answered to patient's satisfaction. The patient knows to call the clinic with any problems questions or concerns.  Cc Tonia Ghent, MD  Return of visit: Repeat CBC in 6 weeks.  Follow-up in 3 months.   Earlie Server, MD, PhD 06/07/2019

## 2019-06-07 NOTE — Patient Instructions (Signed)

## 2019-06-07 NOTE — Addendum Note (Signed)
Addended by: Earlie Server on: 06/07/2019 09:06 PM   Modules accepted: Orders

## 2019-06-07 NOTE — Progress Notes (Signed)
Patient stated that he had been doing well. Patient would like his lab results.

## 2019-06-08 LAB — FECAL OCCULT BLOOD, IMMUNOCHEMICAL: Fecal Occult Bld: NEGATIVE

## 2019-07-25 ENCOUNTER — Encounter: Payer: Self-pay | Admitting: Oncology

## 2019-08-02 ENCOUNTER — Other Ambulatory Visit: Payer: Self-pay | Admitting: Interventional Cardiology

## 2019-08-09 ENCOUNTER — Ambulatory Visit: Payer: Managed Care, Other (non HMO) | Admitting: Family Medicine

## 2019-08-09 ENCOUNTER — Other Ambulatory Visit: Payer: Self-pay

## 2019-08-09 ENCOUNTER — Encounter: Payer: Self-pay | Admitting: Family Medicine

## 2019-08-09 DIAGNOSIS — M5430 Sciatica, unspecified side: Secondary | ICD-10-CM | POA: Insufficient documentation

## 2019-08-09 MED ORDER — PREDNISONE 10 MG PO TABS: ORAL_TABLET | ORAL | 0 refills | Status: DC

## 2019-08-09 NOTE — Progress Notes (Signed)
This visit occurred during the SARS-CoV-2 public health emergency.  Safety protocols were in place, including screening questions prior to the visit, additional usage of staff PPE, and extensive cleaning of exam room while observing appropriate contact time as indicated for disinfecting solutions.  Has has some intermittent numbness in the feet and is tolerating crestor.  D/w pt.    R leg pain, sciatica. Going on for about 5 days.  No L sided sx.  No clear trigger. No FCNAVD.  He has been active at baseline.  No weakness.  No B/B sx.  H/o PT in the past.  Radiates to the R buttock and R thigh, not down to the foot.  He is some better today.  Intermittent sx.  Taking tylenol recently.  He has avoided nsaids.  H/o tolerance to prednisone.   He is working from home.  He is reading and meditating more.  He is trying to manage stressors.    Meds, vitals, and allergies reviewed.   ROS: Per HPI unless specifically indicated in ROS section   nad ncat Neck supple, no LA rrr ctab Back not ttp S/S grossly wnl BLE, able to bear weight.  R SLR neg.

## 2019-08-09 NOTE — Assessment & Plan Note (Signed)
No sx at this point.  Presumed based on hx.  No weakness.  HEP dw pt, handout given, start prednisone with routine cautions and he'll update me as needed.  No imaging needed at this point.

## 2019-08-09 NOTE — Patient Instructions (Signed)
Use the back exercises and take prednisone with food.  Update me as needed.  Take care.  Glad to see you.

## 2019-08-17 ENCOUNTER — Other Ambulatory Visit: Payer: Self-pay | Admitting: Nurse Practitioner

## 2019-08-22 ENCOUNTER — Encounter: Payer: Self-pay | Admitting: Oncology

## 2019-09-04 ENCOUNTER — Encounter: Payer: Self-pay | Admitting: Oncology

## 2019-09-06 ENCOUNTER — Inpatient Hospital Stay: Payer: Managed Care, Other (non HMO) | Attending: Oncology | Admitting: Oncology

## 2019-09-06 ENCOUNTER — Other Ambulatory Visit: Payer: Self-pay

## 2019-09-06 ENCOUNTER — Encounter: Payer: Self-pay | Admitting: Oncology

## 2019-09-06 ENCOUNTER — Inpatient Hospital Stay: Payer: Managed Care, Other (non HMO)

## 2019-09-06 VITALS — BP 149/83 | HR 71 | Temp 97.4°F | Resp 18 | Wt 224.9 lb

## 2019-09-06 VITALS — BP 151/81 | HR 69 | Resp 18

## 2019-09-06 DIAGNOSIS — D751 Secondary polycythemia: Secondary | ICD-10-CM

## 2019-09-06 DIAGNOSIS — I252 Old myocardial infarction: Secondary | ICD-10-CM | POA: Diagnosis not present

## 2019-09-06 DIAGNOSIS — E291 Testicular hypofunction: Secondary | ICD-10-CM | POA: Diagnosis not present

## 2019-09-06 NOTE — Progress Notes (Signed)
Patient does not offer any problems today.  

## 2019-09-06 NOTE — Progress Notes (Signed)
Hematology/Oncology Consult note [facility] Telephone:([phone] [phone]   Patient Care Team: Tonia Ghent, MD as PCP - General (Family Medicine) Belva Crome, MD as PCP - Cardiology (Cardiology) Pa, [facility] (Urology) Festus Aloe, MD as Consulting Physician (Urology)  REFERRING PROVIDER: Tonia Ghent, MD  CHIEF COMPLAINTS/REASON FOR VISIT:  Follow-up for polycytosis  HISTORY OF PRESENTING ILLNESS:  Stephen [name] is a 59 y.o. male who was seen in consultation at the request of Damita Dunnings, Elveria Rising, MD for evaluation of polycytosis/erythrocytosis Patient does not have recent blood work in current EMR. Reviewed previous blood work that was scanned into EMR. 10/15/2018 hemoglobin 17.4, hematocrit 51.7 12/12/2018 hemoglobin 17.8, hematocrit 50.5. 01/04/2019 hemoglobin 17.2, hematocrit 48  Patient has been well testosterone supplementation for treatment of primary hypogonadism since 2015.  [facility].  Currently on testosterone 150 mg injection every 2 weeks Reports previous history of myocardial infarction, in 2060  Associated signs or symptoms: Denies weight loss, fever, chills, fatigue, night sweats.   Context:  Smoking history: Denies.  Former smoker. Testosterone supplements: See above. History of blood clots: Denies Daytime somnolence: Denies Family history of polycythemia: Father with erythrocytosis and need phlebotomy.  Per patient, father probably also had secondary erythrocytosis.  Details not clear.  Patient has had phlebotomy previously, obtained by urologist.  Reports that recently he had had a phlebotomy a few weeks ago.  1 week after phlebotomy, his hemoglobin was around 17.  I do not have his most recent blood work.  INTERVAL HISTORY Stephen [name] is a 59 y.o. male who has above history reviewed by me today presents for follow up visit for management of  erythrocytosis Problems and complaints are listed below: Patient had blood work done at [facility].  He presents for follow up . No new concerns.  He is on testosterone replacement treatment.  Review of Systems  Constitutional: Negative for appetite change, chills, fatigue, fever and unexpected weight change.  HENT:   Negative for hearing loss and voice change.   Eyes: Negative for eye problems and icterus.  Respiratory: Negative for chest tightness, cough and shortness of breath.   Cardiovascular: Negative for chest pain and leg swelling.  Gastrointestinal: Negative for abdominal distention and abdominal pain.  Endocrine: Negative for hot flashes.  Genitourinary: Negative for difficulty urinating, dysuria and frequency.   Musculoskeletal: Negative for arthralgias.  Skin: Negative for itching and rash.  Neurological: Negative for light-headedness and numbness.  Hematological: Negative for adenopathy. Does not bruise/bleed easily.  Psychiatric/Behavioral: Negative for confusion.    MEDICAL HISTORY:  Past Medical History:  Diagnosis Date  . Achilles tendon injury    right  . CAD S/P percutaneous coronary angioplasty 05/25/2014  . ED (erectile dysfunction)   . Elevated glucose   . GERD (gastroesophageal reflux disease)   . Gout   . Hyperlipidemia   . Hypertension   . Morton's neuroma    2014  . Seasonal allergies    uses advair in May  . ST elevation myocardial infarction (STEMI) of anterior wall ([place]) 05/25/14  . Tobacco abuse, + cigars 05/27/2014    SURGICAL HISTORY: Past Surgical History:  Procedure Laterality Date  . CORONARY ANGIOPLASTY WITH STENT PLACEMENT  05/25/14   Resolute integrity 3.5 X 18 mm DES 3.75 mm  . Fx L forearm closed fx  as a child  . Hospital / asthma     multiple times as child  . LEFT HEART CATHETERIZATION WITH CORONARY ANGIOGRAM  N/A 05/25/2014   Procedure: LEFT HEART CATHETERIZATION WITH CORONARY ANGIOGRAM;  Surgeon: Sinclair Grooms, MD;  Location:  [facility] CATH LAB;  Service: Cardiovascular;  Laterality: N/A;  . PERCUTANEOUS CORONARY STENT INTERVENTION (PCI-S)  05/25/2014   Procedure: PERCUTANEOUS CORONARY STENT INTERVENTION (PCI-S);  Surgeon: Sinclair Grooms, MD;  Location: [facility] CATH LAB;  Service: Cardiovascular;;  PROX LAD     SOCIAL HISTORY: Social History   Socioeconomic History  . Marital status: Married    Spouse name: Not on file  . Number of children: 1  . Years of education: Not on file  . Highest education level: Not on file  Occupational History  . Occupation: [employer]    CommentSecretary/administrator of special chemistries  Tobacco Use  . Smoking status: Former Smoker    Types: Cigars    Quit date: 05/25/2014    Years since quitting: 5.2  . Smokeless tobacco: Never Used  Substance and Sexual Activity  . Alcohol use: Yes    Alcohol/week: 2.0 standard drinks    Types: 2 Cans of beer per week    Comment: occ/episodic  . Drug use: No  . Sexual activity: Yes  Other Topics Concern  . Not on file  Social History Narrative   One son, 1 stepson   Married 2001   From [place]   Works for [employer].    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    Social Determinants of Health   Financial Resource Strain:   . Difficulty of Paying Living Expenses: Not on file  Food Insecurity:   . Worried About Charity fundraiser in the Last Year: Not on file  . Ran Out of Food in the Last Year: Not on file  Transportation Needs:   . Lack of Transportation (Medical): Not on file  . Lack of Transportation (Non-Medical): Not on file  Physical Activity:   . Days of Exercise per Week: Not on file  . Minutes of Exercise per Session: Not on file  Stress:   . Feeling of Stress : Not on file  Social Connections:   . Frequency of Communication with Friends and Family: Not on file  . Frequency of Social Gatherings with Friends and Family: Not on file  . Attends Religious Services: Not on file  . Active Member of Clubs or  Organizations: Not on file  . Attends Archivist Meetings: Not on file  . Marital Status: Not on file  Intimate Partner Violence:   . Fear of Current or Ex-Partner: Not on file  . Emotionally Abused: Not on file  . Physically Abused: Not on file  . Sexually Abused: Not on file    FAMILY HISTORY: Family History  Problem Relation Age of Onset  . Stroke Mother   . Hypertension Father   . Dementia Father   . Stroke Father 57       Multiple  . AAA (abdominal aortic aneurysm) Father   . Hyperlipidemia Sister   . Hypertension Sister   . Alcohol abuse Brother        organ failure  . Prostate cancer Neg Hx   . Colon cancer Neg Hx     ALLERGIES:  is allergic to claritin-d 12 hour [loratadine-pseudoephedrine er]; kiwi extract; penicillins; and simvastatin.  MEDICATIONS:  Current Outpatient Medications  Medication Sig Dispense Refill  . amLODipine (NORVASC) 5 MG tablet TAKE 1 TABLET BY MOUTH  DAILY 90 tablet 3  . aspirin 81  MG chewable tablet Chew 1 tablet (81 mg total) by mouth daily.    . carvedilol (COREG) 3.125 MG tablet TAKE 1 TABLET BY MOUTH TWO  TIMES DAILY 180 tablet 3  . Coenzyme Q10 (CO Q 10) 100 MG CAPS Take 200 mg by mouth daily.    . colchicine 0.6 MG tablet TAKE 1 TABLET BY MOUTH  DAILY AS NEEDED 90 tablet 1  . nitroGLYCERIN (NITROSTAT) 0.4 MG SL tablet Place 1 tablet (0.4 mg total) under the tongue every 5 (five) minutes as needed for chest pain. 25 tablet 3  . rosuvastatin (CRESTOR) 20 MG tablet TAKE 1 TABLET BY MOUTH ONCE DAILY 90 tablet 3  . sildenafil (REVATIO) 20 MG tablet Take 20 mg to 100 mg by mouth daily as needed for erectile dysfunction.    Marland Kitchen testosterone cypionate (DEPOTESTOSTERONE CYPIONATE) 200 MG/ML injection Inject 0.75 mLs (150 mg total) into the muscle every 14 (fourteen) days.    . valsartan-hydrochlorothiazide (DIOVAN-HCT) 320-12.5 MG tablet TAKE 1 TABLET BY MOUTH ONCE DAILY 90 tablet 3  . acetaminophen (TYLENOL) 500 MG tablet Take 500-1,000  mg by mouth daily as needed (pain).    . Fluticasone-Salmeterol (WIXELA INHUB) 100-50 MCG/DOSE AEPB Inhale 1 puff into the lungs as needed (allergies).    . Ketotifen Fumarate (ALAWAY OP) Place 1 drop into both eyes daily as needed (seasonal allergies).    . predniSONE (DELTASONE) 10 MG tablet Take 2 a day for 5 days, then 1 a day for 5 days, with food. Don't take with aleve/ibuprofen. (Patient not taking: Reported on 09/06/2019) 15 tablet 0   No current facility-administered medications for this visit.     PHYSICAL EXAMINATION: ECOG PERFORMANCE STATUS: 0 - Asymptomatic Vitals:   09/06/19 1342  BP: (!) 149/83  Pulse: 71  Resp: 18  Temp: (!) 97.4 F (36.3 C)   Filed Weights   09/06/19 1342  Weight: 224 lb 14.4 oz (102 kg)    Physical Exam Constitutional:      General: He is not in acute distress. HENT:     Head: Normocephalic and atraumatic.  Eyes:     General: No scleral icterus.    Pupils: Pupils are equal, round, and reactive to light.  Cardiovascular:     Rate and Rhythm: Normal rate and regular rhythm.     Heart sounds: Normal heart sounds.  Pulmonary:     Effort: Pulmonary effort is normal. No respiratory distress.     Breath sounds: No wheezing.  Abdominal:     General: Bowel sounds are normal. There is no distension.     Palpations: Abdomen is soft. There is no mass.     Tenderness: There is no abdominal tenderness.  Musculoskeletal:        General: No deformity. Normal range of motion.     Cervical back: Normal range of motion and neck supple.  Skin:    General: Skin is warm and dry.     Findings: No erythema or rash.  Neurological:     Mental Status: He is alert and oriented to person, place, and time. Mental status is at baseline.     Cranial Nerves: No cranial nerve deficit.     Coordination: Coordination normal.  Psychiatric:        Mood and Affect: Mood normal.        Behavior: Behavior normal.        Thought Content: Thought content normal.      RADIOGRAPHIC STUDIES: I have personally reviewed the radiological  images as listed and agreed with the findings in the report. No results found.   LABORATORY DATA:  I have reviewed the data as listed Lab Results  Component Value Date   WBC 6.6 04/05/2018   HGB 16.7 04/05/2018   HCT 48.4 04/05/2018   MCV 92 04/05/2018   PLT 263 04/05/2018   Recent Labs    05/17/19 0816  NA 139  K 4.9  CL 100  CO2 25  GLUCOSE 96  BUN 13  CREATININE 1.14  CALCIUM 9.7  GFRNONAA 71  GFRAA 82  PROT 7.0  ALBUMIN 4.3  AST 19  ALT 29  ALKPHOS 79  BILITOT 0.5   Iron/TIBC/Ferritin/ %Sat No results found for: IRON, TIBC, FERRITIN, IRONPCTSAT   [facility] results 09/03/2019  Hemoglobin 17.5, Hct 50.1   ASSESSMENT & PLAN:  1. Secondary erythrocytosis   2. Hypogonadism in male   3. History of MI (myocardial infarction)    Labs are reviewed and discussed with patient. 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  Hemoglobin 17.5, Hct 50.1,  Proceed therapeutic phlebotomy 500cc x 1 today to keep Hct strictly less than 50.   History of MI, continue Aspirin 81mg  daily, Coreg, Diovan-HCT, Crestor.  [facility] prescription was given to patient, follow up in 3 months.    We spent sufficient time to discuss many aspect of care, questions were answered to patient's satisfaction. The patient knows to call the clinic with any problems questions or concerns.  Earlie Server, MD, PhD 09/06/2019

## 2019-09-22 ENCOUNTER — Ambulatory Visit: Payer: Managed Care, Other (non HMO) | Attending: Internal Medicine

## 2019-09-22 DIAGNOSIS — Z23 Encounter for immunization: Secondary | ICD-10-CM | POA: Insufficient documentation

## 2019-09-22 NOTE — Progress Notes (Signed)
   Covid-19 Vaccination Clinic  Name:  Stephen [name]    MRN: [id number] DOB: [birth year]  09/22/2019  [name] was observed post Covid-19 immunization for 15 minutes without incident. He was provided with Vaccine Information Sheet and instruction to access the V-Safe system.   [name] was instructed to call 911 with any severe reactions post vaccine: Marland Kitchen Difficulty breathing  . Swelling of face and throat  . A fast heartbeat  . A bad rash all over body  . Dizziness and weakness   Immunizations Administered    Name Date Dose VIS Date Route   Pfizer COVID-19 Vaccine 09/22/2019  9:56 AM 0.3 mL 06/28/2019 Intramuscular   Manufacturer: [place]   Lot: [id number]   [place]: [id number]

## 2019-10-15 ENCOUNTER — Ambulatory Visit: Payer: Managed Care, Other (non HMO) | Attending: Internal Medicine

## 2019-10-15 DIAGNOSIS — Z23 Encounter for immunization: Secondary | ICD-10-CM

## 2019-10-15 NOTE — Progress Notes (Signed)
   Covid-19 Vaccination Clinic  Name:  Stephen [name]    MRN: [id number] DOB: 1961-07-07  10/15/2019  Stephen [name] was observed post Covid-19 immunization for 15 minutes without incident. He was provided with Vaccine Information Sheet and instruction to access the V-Safe system.   Stephen [name] was instructed to call 911 with any severe reactions post vaccine: Marland Kitchen Difficulty breathing  . Swelling of face and throat  . A fast heartbeat  . A bad rash all over body  . Dizziness and weakness   Immunizations Administered    Name Date Dose VIS Date Route   Pfizer COVID-19 Vaccine 10/15/2019  8:18 AM 0.3 mL 06/28/2019 Intramuscular   Manufacturer: [place]   Lot: [id number]   [place]: [id number]

## 2019-11-27 ENCOUNTER — Encounter: Payer: Self-pay | Admitting: Oncology

## 2019-12-03 ENCOUNTER — Inpatient Hospital Stay: Payer: Managed Care, Other (non HMO) | Attending: Oncology | Admitting: Oncology

## 2019-12-03 ENCOUNTER — Other Ambulatory Visit: Payer: Self-pay

## 2019-12-03 ENCOUNTER — Encounter: Payer: Self-pay | Admitting: Oncology

## 2019-12-03 ENCOUNTER — Inpatient Hospital Stay: Payer: Managed Care, Other (non HMO)

## 2019-12-03 VITALS — BP 163/90 | HR 76 | Resp 20

## 2019-12-03 VITALS — BP 146/92 | HR 75 | Temp 96.7°F | Resp 18 | Wt 227.0 lb

## 2019-12-03 DIAGNOSIS — D751 Secondary polycythemia: Secondary | ICD-10-CM | POA: Diagnosis not present

## 2019-12-03 DIAGNOSIS — E291 Testicular hypofunction: Secondary | ICD-10-CM | POA: Diagnosis not present

## 2019-12-03 NOTE — Progress Notes (Signed)
Pt here for follow up. No concerns voiced.  

## 2019-12-03 NOTE — Progress Notes (Signed)
Hematology/Oncology note [facility] [phone] Fax:(336) [phone]   Patient Care Team: Tonia Ghent, MD as PCP - General (Family Medicine) Belva Crome, MD as PCP - Cardiology (Cardiology) Pa, [facility] (Urology) Festus Aloe, MD as Consulting Physician (Urology)  REFERRING PROVIDER: Tonia Ghent, MD  CHIEF COMPLAINTS/REASON FOR VISIT:  Follow-up for polycytosis  HISTORY OF PRESENTING ILLNESS:  Stephen [name] is a 59 y.o. male who was seen in consultation at the request of Damita Dunnings, Elveria Rising, MD for evaluation of polycytosis/erythrocytosis Patient does not have recent blood work in current EMR. Reviewed previous blood work that was scanned into EMR. 10/15/2018 hemoglobin 17.4, hematocrit 51.7 12/12/2018 hemoglobin 17.8, hematocrit 50.5. 01/04/2019 hemoglobin 17.2, hematocrit 48  Patient has been well testosterone supplementation for treatment of primary hypogonadism since 2015.  [facility].  Currently on testosterone 150 mg injection every 2 weeks Reports previous history of myocardial infarction, in 2060  Associated signs or symptoms: Denies weight loss, fever, chills, fatigue, night sweats.   Context:  Smoking history: Denies.  Former smoker. Testosterone supplements: See above. History of blood clots: Denies Daytime somnolence: Denies Family history of polycythemia: Father with erythrocytosis and need phlebotomy.  Per patient, father probably also had secondary erythrocytosis.  Details not clear.  Patient has had phlebotomy previously, obtained by urologist.  Reports that recently he had had a phlebotomy a few weeks ago.  1 week after phlebotomy, his hemoglobin was around 17.  I do not have his most recent blood work.  INTERVAL HISTORY Stephen [name] is a 59 y.o. male who has above history reviewed by me today presents for follow up visit for management of erythrocytosis Problems  and complaints are listed below: Patient denies any new complaints.  He is testosterone replacement treatments.  Follow-up with urology. Patient had work-up done at [facility]. Review of Systems  Constitutional: Negative for appetite change, chills, fatigue, fever and unexpected weight change.  HENT:   Negative for hearing loss and voice change.   Eyes: Negative for eye problems and icterus.  Respiratory: Negative for chest tightness, cough and shortness of breath.   Cardiovascular: Negative for chest pain and leg swelling.  Gastrointestinal: Negative for abdominal distention and abdominal pain.  Endocrine: Negative for hot flashes.  Genitourinary: Negative for difficulty urinating, dysuria and frequency.   Musculoskeletal: Negative for arthralgias.  Skin: Negative for itching and rash.  Neurological: Negative for light-headedness and numbness.  Hematological: Negative for adenopathy. Does not bruise/bleed easily.  Psychiatric/Behavioral: Negative for confusion.    MEDICAL HISTORY:  Past Medical History:  Diagnosis Date  . Achilles tendon injury    right  . CAD S/P percutaneous coronary angioplasty 05/25/2014  . ED (erectile dysfunction)   . Elevated glucose   . GERD (gastroesophageal reflux disease)   . Gout   . Hyperlipidemia   . Hypertension   . Morton's neuroma    2014  . Seasonal allergies    uses advair in May  . ST elevation myocardial infarction (STEMI) of anterior wall ([place]) 05/25/14  . Tobacco abuse, + cigars 05/27/2014    SURGICAL HISTORY: Past Surgical History:  Procedure Laterality Date  . CORONARY ANGIOPLASTY WITH STENT PLACEMENT  05/25/14   Resolute integrity 3.5 X 18 mm DES 3.75 mm  . Fx L forearm closed fx  as a child  . Hospital / asthma     multiple times as child  . LEFT HEART CATHETERIZATION WITH CORONARY ANGIOGRAM N/A 05/25/2014   Procedure:  LEFT HEART CATHETERIZATION WITH CORONARY ANGIOGRAM;  Surgeon: Sinclair Grooms, MD;  Location: [facility] CATH LAB;   Service: Cardiovascular;  Laterality: N/A;  . PERCUTANEOUS CORONARY STENT INTERVENTION (PCI-S)  05/25/2014   Procedure: PERCUTANEOUS CORONARY STENT INTERVENTION (PCI-S);  Surgeon: Sinclair Grooms, MD;  Location: [facility] CATH LAB;  Service: Cardiovascular;;  PROX LAD     SOCIAL HISTORY: Social History   Socioeconomic History  . Marital status: Married    Spouse name: Not on file  . Number of children: 1  . Years of education: Not on file  . Highest education level: Not on file  Occupational History  . Occupation: [employer]    CommentSecretary/administrator of special chemistries  Tobacco Use  . Smoking status: Former Smoker    Types: Cigars    Quit date: 05/25/2014    Years since quitting: 5.5  . Smokeless tobacco: Never Used  Substance and Sexual Activity  . Alcohol use: Yes    Alcohol/week: 2.0 standard drinks    Types: 2 Cans of beer per week    Comment: occ/episodic  . Drug use: No  . Sexual activity: Yes  Other Topics Concern  . Not on file  Social History Narrative   One son, 1 stepson   Married 2001   From [place]   Works for [employer].    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    Social Determinants of Health   Financial Resource Strain:   . Difficulty of Paying Living Expenses:   Food Insecurity:   . Worried About Charity fundraiser in the Last Year:   . Arboriculturist in the Last Year:   Transportation Needs:   . Film/video editor (Medical):   Marland Kitchen Lack of Transportation (Non-Medical):   Physical Activity:   . Days of Exercise per Week:   . Minutes of Exercise per Session:   Stress:   . Feeling of Stress :   Social Connections:   . Frequency of Communication with Friends and Family:   . Frequency of Social Gatherings with Friends and Family:   . Attends Religious Services:   . Active Member of Clubs or Organizations:   . Attends Archivist Meetings:   Marland Kitchen Marital Status:   Intimate Partner Violence:   . Fear of Current or  Ex-Partner:   . Emotionally Abused:   Marland Kitchen Physically Abused:   . Sexually Abused:     FAMILY HISTORY: Family History  Problem Relation Age of Onset  . Stroke Mother   . Hypertension Father   . Dementia Father   . Stroke Father 57       Multiple  . AAA (abdominal aortic aneurysm) Father   . Hyperlipidemia Sister   . Hypertension Sister   . Alcohol abuse Brother        organ failure  . Prostate cancer Neg Hx   . Colon cancer Neg Hx     ALLERGIES:  is allergic to claritin-d 12 hour [loratadine-pseudoephedrine er]; kiwi extract; penicillins; and simvastatin.  MEDICATIONS:  Current Outpatient Medications  Medication Sig Dispense Refill  . acetaminophen (TYLENOL) 500 MG tablet Take 500-1,000 mg by mouth daily as needed (pain).    Marland Kitchen amLODipine (NORVASC) 5 MG tablet TAKE 1 TABLET BY MOUTH  DAILY 90 tablet 3  . aspirin 81 MG chewable tablet Chew 1 tablet (81 mg total) by mouth daily.    . carvedilol (COREG) 3.125 MG tablet TAKE 1  TABLET BY MOUTH TWO  TIMES DAILY 180 tablet 3  . Coenzyme Q10 (CO Q 10) 100 MG CAPS Take 200 mg by mouth daily.    . Fluticasone-Salmeterol (WIXELA INHUB) 100-50 MCG/DOSE AEPB Inhale 1 puff into the lungs as needed (allergies).    . Ketotifen Fumarate (ALAWAY OP) Place 1 drop into both eyes daily as needed (seasonal allergies).    . rosuvastatin (CRESTOR) 20 MG tablet TAKE 1 TABLET BY MOUTH ONCE DAILY 90 tablet 3  . sildenafil (REVATIO) 20 MG tablet Take 20 mg to 100 mg by mouth daily as needed for erectile dysfunction.    Marland Kitchen testosterone cypionate (DEPOTESTOSTERONE CYPIONATE) 200 MG/ML injection Inject 0.75 mLs (150 mg total) into the muscle every 14 (fourteen) days.    . valsartan-hydrochlorothiazide (DIOVAN-HCT) 320-12.5 MG tablet TAKE 1 TABLET BY MOUTH ONCE DAILY 90 tablet 3  . colchicine 0.6 MG tablet TAKE 1 TABLET BY MOUTH  DAILY AS NEEDED (Patient not taking: Reported on 12/03/2019) 90 tablet 1  . nitroGLYCERIN (NITROSTAT) 0.4 MG SL tablet Place 1 tablet  (0.4 mg total) under the tongue every 5 (five) minutes as needed for chest pain. (Patient not taking: Reported on 12/03/2019) 25 tablet 3   No current facility-administered medications for this visit.     PHYSICAL EXAMINATION: ECOG PERFORMANCE STATUS: 0 - Asymptomatic Vitals:   12/03/19 1404  BP: (!) 146/92  Pulse: 75  Resp: 18  Temp: (!) 96.7 F (35.9 C)   Filed Weights   12/03/19 1404  Weight: 227 lb (103 kg)    Physical Exam Constitutional:      General: He is not in acute distress. HENT:     Head: Normocephalic and atraumatic.  Eyes:     General: No scleral icterus.    Pupils: Pupils are equal, round, and reactive to light.  Cardiovascular:     Rate and Rhythm: Normal rate and regular rhythm.     Heart sounds: Normal heart sounds.  Pulmonary:     Effort: Pulmonary effort is normal. No respiratory distress.     Breath sounds: No wheezing.  Abdominal:     General: Bowel sounds are normal. There is no distension.     Palpations: Abdomen is soft. There is no mass.     Tenderness: There is no abdominal tenderness.  Musculoskeletal:        General: No deformity. Normal range of motion.     Cervical back: Normal range of motion and neck supple.  Skin:    General: Skin is warm and dry.     Findings: No erythema or rash.  Neurological:     Mental Status: He is alert and oriented to person, place, and time. Mental status is at baseline.     Cranial Nerves: No cranial nerve deficit.     Coordination: Coordination normal.  Psychiatric:        Mood and Affect: Mood normal.        Behavior: Behavior normal.        Thought Content: Thought content normal.     RADIOGRAPHIC STUDIES: I have personally reviewed the radiological images as listed and agreed with the findings in the report. No results found.   LABORATORY DATA:  I have reviewed the data as listed Lab Results  Component Value Date   WBC 6.6 04/05/2018   HGB 16.7 04/05/2018   HCT 48.4 04/05/2018   MCV 92  04/05/2018   PLT 263 04/05/2018   Recent Labs    05/17/19 0816  NA 139  K 4.9  CL 100  CO2 25  GLUCOSE 96  BUN 13  CREATININE 1.14  CALCIUM 9.7  GFRNONAA 71  GFRAA 82  PROT 7.0  ALBUMIN 4.3  AST 19  ALT 29  ALKPHOS 79  BILITOT 0.5   Iron/TIBC/Ferritin/ %Sat No results found for: IRON, TIBC, FERRITIN, IRONPCTSAT   [facility] results 09/03/2019  Hemoglobin 17.5, Hct 50.1   ASSESSMENT & PLAN:  1. Secondary erythrocytosis   2. Hypogonadism in male    # Secondary erythrocytosis  labs reviewed and discussed with patient.  l Hemoglobin is at 17.5 with hematocrit 50.5. Recommend patient to proceed with phlebotomy 500 cc x 1 today to keep hematocrit less than 50.  #Hypogonadism in male, patient is on testosterone treatments.  Continue follow-up with urology. Follow-up in 3 months. We spent sufficient time to discuss many aspect of care, questions were answered to patient's satisfaction. The patient knows to call the clinic with any problems questions or concerns.  Earlie Server, MD, PhD 12/03/2019

## 2020-02-02 ENCOUNTER — Other Ambulatory Visit: Payer: Self-pay | Admitting: Nurse Practitioner

## 2020-03-02 ENCOUNTER — Encounter: Payer: Self-pay | Admitting: Oncology

## 2020-03-04 ENCOUNTER — Other Ambulatory Visit: Payer: Self-pay

## 2020-03-04 ENCOUNTER — Encounter: Payer: Self-pay | Admitting: Oncology

## 2020-03-04 ENCOUNTER — Inpatient Hospital Stay: Payer: Managed Care, Other (non HMO)

## 2020-03-04 ENCOUNTER — Inpatient Hospital Stay: Payer: Managed Care, Other (non HMO) | Attending: Oncology | Admitting: Oncology

## 2020-03-04 VITALS — BP 132/76 | HR 71

## 2020-03-04 VITALS — BP 145/77 | HR 73 | Temp 97.0°F | Resp 16 | Wt 221.7 lb

## 2020-03-04 DIAGNOSIS — D751 Secondary polycythemia: Secondary | ICD-10-CM | POA: Insufficient documentation

## 2020-03-04 DIAGNOSIS — E291 Testicular hypofunction: Secondary | ICD-10-CM

## 2020-03-04 NOTE — Progress Notes (Signed)
Hematology/Oncology note [facility] [phone] Fax:(336) [phone]   Patient Care Team: Tonia Ghent, MD as PCP - General (Family Medicine) Belva Crome, MD as PCP - Cardiology (Cardiology) Pa, [facility] (Urology) Festus Aloe, MD as Consulting Physician (Urology)  REFERRING PROVIDER: Tonia Ghent, MD  CHIEF COMPLAINTS/REASON FOR VISIT:  Follow-up for polycytosis  HISTORY OF PRESENTING ILLNESS:  Stephen [name] is a 59 y.o. male who was seen in consultation at the request of Damita Dunnings, Elveria Rising, MD for evaluation of polycytosis/erythrocytosis Patient does not have recent blood work in current EMR. Reviewed previous blood work that was scanned into EMR. 10/15/2018 hemoglobin 17.4, hematocrit 51.7 12/12/2018 hemoglobin 17.8, hematocrit 50.5. 01/04/2019 hemoglobin 17.2, hematocrit 48  Patient has been well testosterone supplementation for treatment of primary hypogonadism since 2015.  [facility].  Currently on testosterone 150 mg injection every 2 weeks Reports previous history of myocardial infarction, in 2060  Associated signs or symptoms: Denies weight loss, fever, chills, fatigue, night sweats.   Context:  Smoking history: Denies.  Former smoker. Testosterone supplements: See above. History of blood clots: Denies Daytime somnolence: Denies Family history of polycythemia: Father with erythrocytosis and need phlebotomy.  Per patient, father probably also had secondary erythrocytosis.  Details not clear.  Patient has had phlebotomy previously, obtained by urologist.  Reports that recently he had had a phlebotomy a few weeks ago.  1 week after phlebotomy, his hemoglobin was around 17.  I do not have his most recent blood work.  INTERVAL HISTORY Stephen [name] is a 59 y.o. male who has above history reviewed by me today presents for follow up visit for management of erythrocytosis Problems  and complaints are listed below: Patient reports [name] new complaints.  He is on testosterone placement treatment every 2 weeks. He feels well and has tolerated phlebotomy.  Patient had work-up done at [facility]. Review of Systems  Constitutional: Negative for appetite change, chills, fatigue, fever and unexpected weight change.  HENT:   Negative for hearing loss and voice change.   Eyes: Negative for eye problems and icterus.  Respiratory: Negative for chest tightness, cough and shortness of breath.   Cardiovascular: Negative for chest pain and leg swelling.  Gastrointestinal: Negative for abdominal distention and abdominal pain.  Endocrine: Negative for hot flashes.  Genitourinary: Negative for difficulty urinating, dysuria and frequency.   Musculoskeletal: Negative for arthralgias.  Skin: Negative for itching and rash.  Neurological: Negative for light-headedness and numbness.  Hematological: Negative for adenopathy. Does not bruise/bleed easily.  Psychiatric/Behavioral: Negative for confusion.    MEDICAL HISTORY:  Past Medical History:  Diagnosis Date  . Achilles tendon injury    right  . CAD S/P percutaneous coronary angioplasty 05/25/2014  . ED (erectile dysfunction)   . Elevated glucose   . GERD (gastroesophageal reflux disease)   . Gout   . Hyperlipidemia   . Hypertension   . Morton's neuroma    2014  . Seasonal allergies    uses advair in May  . ST elevation myocardial infarction (STEMI) of anterior wall ([place]) 05/25/14  . Tobacco abuse, + cigars 05/27/2014    SURGICAL HISTORY: Past Surgical History:  Procedure Laterality Date  . CORONARY ANGIOPLASTY WITH STENT PLACEMENT  05/25/14   Resolute integrity 3.5 X 18 mm DES 3.75 mm  . Fx L forearm closed fx  as a child  . Hospital / asthma     multiple times as child  . LEFT HEART CATHETERIZATION  WITH CORONARY ANGIOGRAM N/A 05/25/2014   Procedure: LEFT HEART CATHETERIZATION WITH CORONARY ANGIOGRAM;  Surgeon: Sinclair Grooms, MD;  Location: [facility] CATH LAB;  Service: Cardiovascular;  Laterality: N/A;  . PERCUTANEOUS CORONARY STENT INTERVENTION (PCI-S)  05/25/2014   Procedure: PERCUTANEOUS CORONARY STENT INTERVENTION (PCI-S);  Surgeon: Sinclair Grooms, MD;  Location: [facility] CATH LAB;  Service: Cardiovascular;;  PROX LAD     SOCIAL HISTORY: Social History   Socioeconomic History  . Marital status: Married    Spouse name: Not on file  . Number of children: 1  . Years of education: Not on file  . Highest education level: Not on file  Occupational History  . Occupation: [employer]    CommentSecretary/administrator of special chemistries  Tobacco Use  . Smoking status: Former Smoker    Types: Cigars    Quit date: 05/25/2014    Years since quitting: 5.7  . Smokeless tobacco: Never Used  Substance and Sexual Activity  . Alcohol use: Yes    Alcohol/week: 2.0 standard drinks    Types: 2 Cans of beer per week    Comment: occ/episodic  . Drug use: [name]  . Sexual activity: Yes  Other Topics Concern  . Not on file  Social History Narrative   One son, 1 stepson   Married 2001   From [place]   Works for [employer].    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    Social Determinants of Health   Financial Resource Strain:   . Difficulty of Paying Living Expenses:   Food Insecurity:   . Worried About Charity fundraiser in the Last Year:   . Arboriculturist in the Last Year:   Transportation Needs:   . Film/video editor (Medical):   Marland Kitchen Lack of Transportation (Non-Medical):   Physical Activity:   . Days of Exercise per Week:   . Minutes of Exercise per Session:   Stress:   . Feeling of Stress :   Social Connections:   . Frequency of Communication with Friends and Family:   . Frequency of Social Gatherings with Friends and Family:   . Attends Religious Services:   . Active Member of Clubs or Organizations:   . Attends Archivist Meetings:   Marland Kitchen Marital Status:   Intimate Partner  Violence:   . Fear of Current or Ex-Partner:   . Emotionally Abused:   Marland Kitchen Physically Abused:   . Sexually Abused:     FAMILY HISTORY: Family History  Problem Relation Age of Onset  . Stroke Mother   . Hypertension Father   . Dementia Father   . Stroke Father 72       Multiple  . AAA (abdominal aortic aneurysm) Father   . Hyperlipidemia Sister   . Hypertension Sister   . Alcohol abuse Brother        organ failure  . Prostate cancer Neg Hx   . Colon cancer Neg Hx     ALLERGIES:  is allergic to claritin-d 12 hour [loratadine-pseudoephedrine er], kiwi extract, penicillins, and simvastatin.  MEDICATIONS:  Current Outpatient Medications  Medication Sig Dispense Refill  . acetaminophen (TYLENOL) 500 MG tablet Take 500-1,000 mg by mouth daily as needed (pain).    Marland Kitchen amLODipine (NORVASC) 5 MG tablet TAKE 1 TABLET BY MOUTH  DAILY 90 tablet 3  . aspirin 81 MG chewable tablet Chew 1 tablet (81 mg total) by mouth daily.    Marland Kitchen  carvedilol (COREG) 3.125 MG tablet TAKE 1 TABLET BY MOUTH TWO  TIMES DAILY 180 tablet 3  . Coenzyme Q10 (CO Q 10) 100 MG CAPS Take 200 mg by mouth daily.    . Fluticasone-Salmeterol (WIXELA INHUB) 100-50 MCG/DOSE AEPB Inhale 1 puff into the lungs as needed (allergies).    . Ketotifen Fumarate (ALAWAY OP) Place 1 drop into both eyes daily as needed (seasonal allergies).    . rosuvastatin (CRESTOR) 20 MG tablet TAKE 1 TABLET BY MOUTH ONCE DAILY 90 tablet 3  . sildenafil (REVATIO) 20 MG tablet Take 20 mg to 100 mg by mouth daily as needed for erectile dysfunction.    Marland Kitchen testosterone cypionate (DEPOTESTOSTERONE CYPIONATE) 200 MG/ML injection Inject 0.75 mLs (150 mg total) into the muscle every 14 (fourteen) days.    . valsartan-hydrochlorothiazide (DIOVAN-HCT) 320-12.5 MG tablet TAKE 1 TABLET BY MOUTH ONCE DAILY 90 tablet 3  . colchicine 0.6 MG tablet TAKE 1 TABLET BY MOUTH  DAILY AS NEEDED (Patient not taking: Reported on 12/03/2019) 90 tablet 1  . nitroGLYCERIN (NITROSTAT)  0.4 MG SL tablet Place 1 tablet (0.4 mg total) under the tongue every 5 (five) minutes as needed for chest pain. (Patient not taking: Reported on 12/03/2019) 25 tablet 3   [name] current facility-administered medications for this visit.     PHYSICAL EXAMINATION: ECOG PERFORMANCE STATUS: 0 - Asymptomatic Vitals:   03/04/20 1312  BP: (!) 145/77  Pulse: 73  Resp: 16  Temp: (!) 97 F (36.1 C)  SpO2: 96%   Filed Weights   03/04/20 1312  Weight: 221 lb 11.2 oz (100.6 kg)    Physical Exam Constitutional:      General: He is not in acute distress. HENT:     Head: Normocephalic and atraumatic.  Eyes:     General: [name] scleral icterus.    Pupils: Pupils are equal, round, and reactive to light.  Cardiovascular:     Rate and Rhythm: Normal rate and regular rhythm.     Heart sounds: Normal heart sounds.  Pulmonary:     Effort: Pulmonary effort is normal. [name] respiratory distress.     Breath sounds: [name] wheezing.  Abdominal:     General: Bowel sounds are normal. There is [name] distension.     Palpations: Abdomen is soft. There is [name] mass.     Tenderness: There is [name] abdominal tenderness.  Musculoskeletal:        General: [name] deformity. Normal range of motion.     Cervical back: Normal range of motion and neck supple.  Skin:    General: Skin is warm and dry.     Findings: [name] erythema or rash.  Neurological:     Mental Status: He is alert and oriented to person, place, and time. Mental status is at baseline.     Cranial Nerves: [name] cranial nerve deficit.     Coordination: Coordination normal.  Psychiatric:        Mood and Affect: Mood normal.        Behavior: Behavior normal.        Thought Content: Thought content normal.     RADIOGRAPHIC STUDIES: I have personally reviewed the radiological images as listed and agreed with the findings in the report. [name] results found.   LABORATORY DATA:  I have reviewed the data as listed Lab Results  Component Value Date   WBC 6.6 04/05/2018    HGB 16.7 04/05/2018   HCT 48.4 04/05/2018   MCV 92 04/05/2018   PLT  263 04/05/2018   Recent Labs    05/17/19 0816  NA 139  K 4.9  CL 100  CO2 25  GLUCOSE 96  BUN 13  CREATININE 1.14  CALCIUM 9.7  GFRNONAA 71  GFRAA 82  PROT 7.0  ALBUMIN 4.3  AST 19  ALT 29  ALKPHOS 79  BILITOT 0.5   Iron/TIBC/Ferritin/ %Sat [name] results found for: IRON, TIBC, FERRITIN, IRONPCTSAT   [facility] results 09/03/2019  Hemoglobin 17.5, Hct 50.1   ASSESSMENT & PLAN:  1. Secondary erythrocytosis   2. Hypogonadism in male    #Secondary erythrocytosis  labs reviewed and discussed with patient.  l Hemoglobin is at 17.4 with hematocrit 50.7.  Recommend to proceed with phlebotomy 500 cc x 1 today to keep hematocrit less than 50.   #Hypogonadism in male, patient is on testosterone treatments.  Continue follow-up with urology. Follow-up in 3 months. We spent sufficient time to discuss many aspect of care, questions were answered to patient's satisfaction. The patient knows to call the clinic with any problems questions or concerns.  Earlie Server, MD, PhD 03/04/2020

## 2020-03-04 NOTE — Progress Notes (Signed)
Pt here for follow up. No new concerns voiced.   

## 2020-04-09 ENCOUNTER — Telehealth: Payer: Self-pay | Admitting: Family Medicine

## 2020-04-09 NOTE — Telephone Encounter (Signed)
Pt needs form filled out by Dr. Damita Dunnings. Appointment scheduled 04/13/20. Form placed in RX tower for Dr. Damita Dunnings.

## 2020-04-13 ENCOUNTER — Encounter: Payer: Self-pay | Admitting: Family Medicine

## 2020-04-13 ENCOUNTER — Other Ambulatory Visit: Payer: Self-pay

## 2020-04-13 ENCOUNTER — Ambulatory Visit: Payer: Managed Care, Other (non HMO) | Admitting: Family Medicine

## 2020-04-13 DIAGNOSIS — I10 Essential (primary) hypertension: Secondary | ICD-10-CM

## 2020-04-13 NOTE — Patient Instructions (Signed)
Thanks for your effort.  Update me as needed.  Take care.  Glad to see you. 

## 2020-04-13 NOTE — Progress Notes (Signed)
This visit occurred during the SARS-CoV-2 public health emergency.  Safety protocols were in place, including screening questions prior to the visit, additional usage of staff PPE, and extensive cleaning of exam room while observing appropriate contact time as indicated for disinfecting solutions.  Hypertension:    Using medication without problems or lightheadedness:  yes Chest pain with exertion:no Edema:no Short of breath:no He has noted occ bradycardia on his watch, unclear accuracy of that device.  No syncope.  I asked him to update cardiology with f/u pending.    I filled out his form for work regarding his BMI.  He is working on diet and exercise, which is appropriate.  He will get his flu shot done at the pharmacy.  Meds, vitals, and allergies reviewed.   PMH and SH reviewed ROS: Per HPI unless specifically indicated in ROS section   GEN: nad, alert and oriented HEENT: ncat NECK: supple w/o LA CV: rrr. PULM: ctab, no inc wob ABD: soft, +bs EXT: no edema SKIN: Well-perfused.

## 2020-04-15 NOTE — Assessment & Plan Note (Signed)
I filled out his form for work regarding his BMI.  He is working on diet and exercise, which is appropriate.  No change in amlodipine, carvedilol, valsartan-hydrochlorothiazide.  I did ask him to check with cardiology on follow-up about possible bradycardia that was incidentally noted on his watch.

## 2020-05-13 ENCOUNTER — Other Ambulatory Visit: Payer: Self-pay | Admitting: Family Medicine

## 2020-05-13 DIAGNOSIS — M109 Gout, unspecified: Secondary | ICD-10-CM

## 2020-05-13 DIAGNOSIS — I10 Essential (primary) hypertension: Secondary | ICD-10-CM

## 2020-05-17 NOTE — Progress Notes (Signed)
Cardiology Office Note:    Date:  05/18/2020   ID:  Stephen [name], DOB [birth year], MRN [id number]  PCP:  Tonia Ghent, MD  Cardiologist:  Sinclair Grooms, MD   Referring MD: Tonia Ghent, MD   Chief Complaint  Patient presents with  . Coronary Artery Disease  . Hyperlipidemia  . Hypertension    History of Present Illness:    Stephen [name] is a 59 y.o. male with a hx of anterior ST elevation myocardial infarction treated with LAD drug-eluting stents11/2015, post MI LVEF 60%, hypertension, hyperlipidemia, prior tobacco abuse, and erectile dysfunction.  Under stress at work.  Restructuring is occurring.  His boss was recently fired.  More responsibility is being placed on [place].  He denies angina, dyspnea, orthopnea, PND, palpitations, and syncope.  He is frustrated by inability to lose weight.  Exercises greater than 150 minutes/week.  Past Medical History:  Diagnosis Date  . Achilles tendon injury    right  . CAD S/P percutaneous coronary angioplasty 05/25/2014  . ED (erectile dysfunction)   . Elevated glucose   . GERD (gastroesophageal reflux disease)   . Gout   . Hyperlipidemia   . Hypertension   . Morton's neuroma    2014  . Seasonal allergies    uses advair in May  . ST elevation myocardial infarction (STEMI) of anterior wall ([place]) 05/25/14  . Tobacco abuse, + cigars 05/27/2014    Past Surgical History:  Procedure Laterality Date  . CORONARY ANGIOPLASTY WITH STENT PLACEMENT  05/25/14   Resolute integrity 3.5 X 18 mm DES 3.75 mm  . Fx L forearm closed fx  as a child  . Hospital / asthma     multiple times as child  . LEFT HEART CATHETERIZATION WITH CORONARY ANGIOGRAM N/A 05/25/2014   Procedure: LEFT HEART CATHETERIZATION WITH CORONARY ANGIOGRAM;  Surgeon: Sinclair Grooms, MD;  Location: [facility] CATH LAB;  Service: Cardiovascular;  Laterality: N/A;  . PERCUTANEOUS CORONARY STENT INTERVENTION (PCI-S)  05/25/2014   Procedure: PERCUTANEOUS  CORONARY STENT INTERVENTION (PCI-S);  Surgeon: Sinclair Grooms, MD;  Location[facility] CATH LAB;  Service: Cardiovascular;;  PROX LAD     Current Medications: Current Meds  Medication Sig  . acetaminophen (TYLENOL) 500 MG tablet Take 500-1,000 mg by mouth daily as needed (pain).  Marland Kitchen amLODipine (NORVASC) 5 MG tablet TAKE 1 TABLET BY MOUTH  DAILY  . aspirin 81 MG chewable tablet Chew 1 tablet (81 mg total) by mouth daily.  . Coenzyme Q10 (CO Q 10) 100 MG CAPS Take 200 mg by mouth daily.  . colchicine 0.6 MG tablet TAKE 1 TABLET BY MOUTH  DAILY AS NEEDED  . Fluticasone-Salmeterol (WIXELA INHUB) 100-50 MCG/DOSE AEPB Inhale 1 puff into the lungs as needed (allergies).  . Ketotifen Fumarate (ALAWAY OP) Place 1 drop into both eyes daily as needed (seasonal allergies).  . nitroGLYCERIN (NITROSTAT) 0.4 MG SL tablet Place 1 tablet (0.4 mg total) under the tongue every 5 (five) minutes as needed for chest pain.  . rosuvastatin (CRESTOR) 20 MG tablet TAKE 1 TABLET BY MOUTH ONCE DAILY  . sildenafil (REVATIO) 20 MG tablet Take 20 mg to 100 mg by mouth daily as needed for erectile dysfunction.  Marland Kitchen testosterone cypionate (DEPOTESTOSTERONE CYPIONATE) 200 MG/ML injection Inject 0.75 mLs (150 mg total) into the muscle every 14 (fourteen) days.  . valsartan-hydrochlorothiazide (DIOVAN-HCT) 320-12.5 MG tablet TAKE 1 TABLET BY MOUTH ONCE DAILY  . [DISCONTINUED] carvedilol (COREG) 3.125 MG tablet TAKE 1  TABLET BY MOUTH TWO  TIMES DAILY  . [DISCONTINUED] nitroGLYCERIN (NITROSTAT) 0.4 MG SL tablet Place 1 tablet (0.4 mg total) under the tongue every 5 (five) minutes as needed for chest pain.     Allergies:   Claritin-d 12 hour [loratadine-pseudoephedrine er], Kiwi extract, Penicillins, and Simvastatin   Social History   Socioeconomic History  . Marital status: Married    Spouse name: Not on file  . Number of children: 1  . Years of education: Not on file  . Highest education level: Not on file  Occupational  History  . Occupation: [employer]    CommentSecretary/administrator of special chemistries  Tobacco Use  . Smoking status: Former Smoker    Types: Cigars    Quit date: 05/25/2014    Years since quitting: 5.9  . Smokeless tobacco: Never Used  Substance and Sexual Activity  . Alcohol use: Yes    Alcohol/week: 2.0 standard drinks    Types: 2 Cans of beer per week    Comment: occ/episodic  . Drug use: No  . Sexual activity: Yes  Other Topics Concern  . Not on file  Social History Narrative   One son, 1 stepson   Married 2001   From [place]   Works for [employer].    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    Social Determinants of Health   Financial Resource Strain:   . Difficulty of Paying Living Expenses: Not on file  Food Insecurity:   . Worried About Charity fundraiser in the Last Year: Not on file  . Ran Out of Food in the Last Year: Not on file  Transportation Needs:   . Lack of Transportation (Medical): Not on file  . Lack of Transportation (Non-Medical): Not on file  Physical Activity:   . Days of Exercise per Week: Not on file  . Minutes of Exercise per Session: Not on file  Stress:   . Feeling of Stress : Not on file  Social Connections:   . Frequency of Communication with Friends and Family: Not on file  . Frequency of Social Gatherings with Friends and Family: Not on file  . Attends Religious Services: Not on file  . Active Member of Clubs or Organizations: Not on file  . Attends Archivist Meetings: Not on file  . Marital Status: Not on file     Family History: The patient's family history includes AAA (abdominal aortic aneurysm) in his father; Alcohol abuse in his brother; Dementia in his father; Hyperlipidemia in his sister; Hypertension in his father and sister; Stroke in his mother; Stroke (age of onset: 22) in his father. There is no history of Prostate cancer or Colon cancer.  ROS:   Please see the history of present illness.      Stress and boredom.  Works from home and finds it difficult to avoid snacking.  We also discussed content of his diet and the importance of eliminating/significantly reducing carbohydrates.  All other systems reviewed and are negative.  EKGs/Labs/Other Studies Reviewed:    The following studies were reviewed today: No new imaging data  EKG:  EKG normal sinus rhythm, first-degree AV block at 210 ms, otherwise normal in appearance  Recent Labs: No results found for requested labs within last 8760 hours.  Recent Lipid Panel    Component Value Date/Time   CHOL 103 05/17/2019 0816   TRIG 88 05/17/2019 0816   HDL 41 05/17/2019 0816   CHOLHDL  2.5 05/17/2019 0816   CHOLHDL 3 07/28/2014 0903   VLDL 24.2 07/28/2014 0903   LDLCALC 45 05/17/2019 0816    Physical Exam:    VS:  BP (!) 148/84   Pulse 71   Ht 5\' 6"  (1.676 m)   Wt 225 lb (102.1 kg)   SpO2 96%   BMI 36.32 kg/m     Wt Readings from Last 3 Encounters:  05/18/20 225 lb (102.1 kg)  04/13/20 224 lb 5 oz (101.7 kg)  03/04/20 221 lb 11.2 oz (100.6 kg)     GEN: Obese with BMI 36. No acute distress HEENT: Normal NECK: No JVD. LYMPHATICS: No lymphadenopathy CARDIAC: S4 gallop.  RRR without murmur, gallop, or edema. VASCULAR:  Normal Pulses. No bruits. RESPIRATORY:  Clear to auscultation without rales, wheezing or rhonchi  ABDOMEN: Soft, non-tender, non-distended, No pulsatile mass, MUSCULOSKELETAL: No deformity  SKIN: Warm and dry NEUROLOGIC:  Alert and oriented x 3 PSYCHIATRIC:  Normal affect   ASSESSMENT:    1. CAD in native artery   2. Hyperlipidemia with target LDL less than 70   3. Essential hypertension   4. Fatigue, unspecified type   5. Severe obesity (BMI 35.0-39.9) with comorbidity ([place])   6. Educated about COVID-19 virus infection    PLAN:    In order of problems listed above:  1. Stable without angina.  Risk factor modification discussed in detail and metrics outlined below were reviewed.  Weight is  an issue. 2. Last LDL a year ago was 64.  Blood work is coming up with Dr. Damita Dunnings later this month.  Continue Crestor 20 mg/day. 3. The blood pressure is too high.  Increase carvedilol to 6.25 mg twice daily.  Monitor blood pressures at home.  If blood pressures remain above 130/80, will need to further increase carvedilol.  Low-salt diet recommended.  Limiting alcohol is discussed.  Continue amlodipine 5 mg/day and valsartan HCT 320/12.5 mg daily. 4. Improved.  Compliant with sleep measures.  He has been tested for sleep apnea and does not have it. 5. Decrease calories in diet.  Continue exercise. 6. He is vaccinated, has received a booster.  Has not had COVID-19 disease.  Overall education and awareness concerning secondary risk prevention was discussed in detail: LDL less than 70, hemoglobin A1c less than 7, blood pressure target less than 130/80 mmHg, >150 minutes of moderate aerobic activity per week, avoidance of smoking, weight control (via diet and exercise), and continued surveillance/management of/for obstructive sleep apnea.    Medication Adjustments/Labs and Tests Ordered: Current medicines are reviewed at length with the patient today.  Concerns regarding medicines are outlined above.  No orders of the defined types were placed in this encounter.  Meds ordered this encounter  Medications  . nitroGLYCERIN (NITROSTAT) 0.4 MG SL tablet    Sig: Place 1 tablet (0.4 mg total) under the tongue every 5 (five) minutes as needed for chest pain.    Dispense:  25 tablet    Refill:  3  . carvedilol (COREG) 6.25 MG tablet    Sig: Take 1 tablet (6.25 mg total) by mouth 2 (two) times daily.    Dispense:  180 tablet    Refill:  3    Dose change    Patient Instructions  Medication Instructions:  1) INCREASE Carvedilol to 6.25mg  twice daily  *If you need a refill on your cardiac medications before your next appointment, please call your pharmacy*   Lab Work: None If you have labs  (blood  work) drawn today and your tests are completely normal, you will receive your results only by: Marland Kitchen MyChart Message (if you have MyChart) OR . A paper copy in the mail If you have any lab test that is abnormal or we need to change your treatment, we will call you to review the results.   Testing/Procedures: None   Follow-Up: At [facility], you and your health needs are our priority.  As part of our continuing mission to provide you with exceptional heart care, we have created designated Provider Care Teams.  These Care Teams include your primary Cardiologist (physician) and Advanced Practice Providers (APPs -  Physician Assistants and Nurse Practitioners) who all work together to provide you with the care you need, when you need it.  We recommend signing up for the patient portal called "MyChart".  Sign up information is provided on this After Visit Summary.  MyChart is used to connect with patients for Virtual Visits (Telemedicine).  Patients are able to view lab/test results, encounter notes, upcoming appointments, etc.  Non-urgent messages can be sent to your provider as well.   To learn more about what you can do with MyChart, go to [email or web address].    Your next appointment:   12 month(s)  The format for your next appointment:   In Person  Provider:   You may see Sinclair Grooms, MD or one of the following Advanced Practice Providers on your designated Care Team:    Truitt Merle, NP  Cecilie Kicks, NP  Kathyrn Drown, NP    Other Instructions      Signed, Sinclair Grooms, MD  05/18/2020 9:13 AM    [place]

## 2020-05-18 ENCOUNTER — Other Ambulatory Visit: Payer: Self-pay

## 2020-05-18 ENCOUNTER — Encounter: Payer: Self-pay | Admitting: Interventional Cardiology

## 2020-05-18 ENCOUNTER — Ambulatory Visit: Payer: Managed Care, Other (non HMO) | Admitting: Interventional Cardiology

## 2020-05-18 VITALS — BP 148/84 | HR 71 | Ht 66.0 in | Wt 225.0 lb

## 2020-05-18 DIAGNOSIS — I10 Essential (primary) hypertension: Secondary | ICD-10-CM

## 2020-05-18 DIAGNOSIS — I2510 Atherosclerotic heart disease of native coronary artery without angina pectoris: Secondary | ICD-10-CM | POA: Diagnosis not present

## 2020-05-18 DIAGNOSIS — R5383 Other fatigue: Secondary | ICD-10-CM

## 2020-05-18 DIAGNOSIS — E785 Hyperlipidemia, unspecified: Secondary | ICD-10-CM | POA: Diagnosis not present

## 2020-05-18 DIAGNOSIS — Z7189 Other specified counseling: Secondary | ICD-10-CM

## 2020-05-18 MED ORDER — CARVEDILOL 6.25 MG PO TABS: 6.2500 mg | ORAL_TABLET | Freq: Two times a day (BID) | ORAL | 3 refills | Status: DC

## 2020-05-18 MED ORDER — NITROGLYCERIN 0.4 MG SL SUBL: 0.4000 mg | SUBLINGUAL_TABLET | SUBLINGUAL | 3 refills | Status: DC | PRN

## 2020-05-18 NOTE — Patient Instructions (Signed)
Medication Instructions:  1) INCREASE Carvedilol to 6.25mg  twice daily  *If you need a refill on your cardiac medications before your next appointment, please call your pharmacy*   Lab Work: None If you have labs (blood work) drawn today and your tests are completely normal, you will receive your results only by: Marland Kitchen MyChart Message (if you have MyChart) OR . A paper copy in the mail If you have any lab test that is abnormal or we need to change your treatment, we will call you to review the results.   Testing/Procedures: None   Follow-Up: At [facility], you and your health needs are our priority.  As part of our continuing mission to provide you with exceptional heart care, we have created designated Provider Care Teams.  These Care Teams include your primary Cardiologist (physician) and Advanced Practice Providers (APPs -  Physician Assistants and Nurse Practitioners) who all work together to provide you with the care you need, when you need it.  We recommend signing up for the patient portal called "MyChart".  Sign up information is provided on this After Visit Summary.  MyChart is used to connect with patients for Virtual Visits (Telemedicine).  Patients are able to view lab/test results, encounter notes, upcoming appointments, etc.  Non-urgent messages can be sent to your provider as well.   To learn more about what you can do with MyChart, go to [email or web address].    Your next appointment:   12 month(s)  The format for your next appointment:   In Person  Provider:   You may see Sinclair Grooms, MD or one of the following Advanced Practice Providers on your designated Care Team:    Truitt Merle, NP  Cecilie Kicks, NP  Kathyrn Drown, NP    Other Instructions

## 2020-05-25 ENCOUNTER — Other Ambulatory Visit (INDEPENDENT_AMBULATORY_CARE_PROVIDER_SITE_OTHER): Payer: Managed Care, Other (non HMO)

## 2020-05-25 ENCOUNTER — Other Ambulatory Visit: Payer: Self-pay

## 2020-05-25 DIAGNOSIS — M109 Gout, unspecified: Secondary | ICD-10-CM

## 2020-05-25 DIAGNOSIS — I10 Essential (primary) hypertension: Secondary | ICD-10-CM

## 2020-05-25 NOTE — Addendum Note (Signed)
Addended by: Ellamae Sia on: 05/25/2020 07:34 AM   Modules accepted: Orders

## 2020-05-26 LAB — CBC WITH DIFFERENTIAL/PLATELET
Basophils Absolute: 0 10*3/uL (ref 0.0–0.2)
Basos: 0 %
EOS (ABSOLUTE): 0.5 10*3/uL — ABNORMAL HIGH (ref 0.0–0.4)
Eos: 6 %
Hematocrit: 49.2 % (ref 37.5–51.0)
Hemoglobin: 16.3 g/dL (ref 13.0–17.7)
Immature Grans (Abs): 0 10*3/uL (ref 0.0–0.1)
Immature Granulocytes: 0 %
Lymphocytes Absolute: 1.6 10*3/uL (ref 0.7–3.1)
Lymphs: 22 %
MCH: 29.8 pg (ref 26.6–33.0)
MCHC: 33.1 g/dL (ref 31.5–35.7)
MCV: 90 fL (ref 79–97)
Monocytes Absolute: 0.6 10*3/uL (ref 0.1–0.9)
Monocytes: 8 %
Neutrophils Absolute: 4.4 10*3/uL (ref 1.4–7.0)
Neutrophils: 64 %
Platelets: 259 10*3/uL (ref 150–450)
RBC: 5.47 x10E6/uL (ref 4.14–5.80)
RDW: 13.3 % (ref 11.6–15.4)
WBC: 7 10*3/uL (ref 3.4–10.8)

## 2020-05-26 LAB — COMPREHENSIVE METABOLIC PANEL
ALT: 25 IU/L (ref 0–44)
AST: 15 IU/L (ref 0–40)
Albumin/Globulin Ratio: 1.7 (ref 1.2–2.2)
Albumin: 4.3 g/dL (ref 3.8–4.9)
Alkaline Phosphatase: 86 IU/L (ref 44–121)
BUN/Creatinine Ratio: 14 (ref 9–20)
BUN: 15 mg/dL (ref 6–24)
Bilirubin Total: 0.3 mg/dL (ref 0.0–1.2)
CO2: 27 mmol/L (ref 20–29)
Calcium: 9.1 mg/dL (ref 8.7–10.2)
Chloride: 99 mmol/L (ref 96–106)
Creatinine, Ser: 1.11 mg/dL (ref 0.76–1.27)
GFR calc Af Amer: 84 mL/min/{1.73_m2} (ref >59)
GFR calc non Af Amer: 73 mL/min/{1.73_m2} (ref >59)
Globulin, Total: 2.6 g/dL (ref 1.5–4.5)
Glucose: 102 mg/dL — ABNORMAL HIGH (ref 65–99)
Potassium: 4.5 mmol/L (ref 3.5–5.2)
Sodium: 136 mmol/L (ref 134–144)
Total Protein: 6.9 g/dL (ref 6.0–8.5)

## 2020-05-26 LAB — LIPID PANEL
Chol/HDL Ratio: 3.3 ratio (ref 0.0–5.0)
Cholesterol, Total: 134 mg/dL (ref 100–199)
HDL: 41 mg/dL (ref >39)
LDL Chol Calc (NIH): 77 mg/dL (ref 0–99)
Triglycerides: 80 mg/dL (ref 0–149)
VLDL Cholesterol Cal: 16 mg/dL (ref 5–40)

## 2020-05-26 LAB — URIC ACID: Uric Acid: 6.5 mg/dL (ref 3.8–8.4)

## 2020-06-01 ENCOUNTER — Other Ambulatory Visit: Payer: Self-pay

## 2020-06-01 ENCOUNTER — Encounter: Payer: Self-pay | Admitting: Family Medicine

## 2020-06-01 ENCOUNTER — Ambulatory Visit (INDEPENDENT_AMBULATORY_CARE_PROVIDER_SITE_OTHER): Payer: Managed Care, Other (non HMO) | Admitting: Family Medicine

## 2020-06-01 VITALS — BP 128/74 | HR 64 | Temp 98.2°F | Ht 67.0 in | Wt 227.1 lb

## 2020-06-01 DIAGNOSIS — M25519 Pain in unspecified shoulder: Secondary | ICD-10-CM

## 2020-06-01 DIAGNOSIS — Z0000 Encounter for general adult medical examination without abnormal findings: Secondary | ICD-10-CM

## 2020-06-01 DIAGNOSIS — Z1211 Encounter for screening for malignant neoplasm of colon: Secondary | ICD-10-CM

## 2020-06-01 DIAGNOSIS — Z7189 Other specified counseling: Secondary | ICD-10-CM

## 2020-06-01 DIAGNOSIS — E291 Testicular hypofunction: Secondary | ICD-10-CM

## 2020-06-01 DIAGNOSIS — I10 Essential (primary) hypertension: Secondary | ICD-10-CM

## 2020-06-01 DIAGNOSIS — M109 Gout, unspecified: Secondary | ICD-10-CM

## 2020-06-01 DIAGNOSIS — E785 Hyperlipidemia, unspecified: Secondary | ICD-10-CM

## 2020-06-01 NOTE — Progress Notes (Signed)
This visit occurred during the SARS-CoV-2 public health emergency.  Safety protocols were in place, including screening questions prior to the visit, additional usage of staff PPE, and extensive cleaning of exam room while observing appropriate contact time as indicated for disinfecting solutions.  CPE- See plan.  Routine anticipatory guidance given to patient.  See health maintenance.  The possibility exists that previously documented standard health maintenance information may have been brought forward from a previous encounter into this note.  If needed, that same information has been updated to reflect the current situation based on today's encounter.    Tetanus 2011 Flu done 2021 Shingles d/w pt.  PNA not due covid vaccine 2021 D/w patient HW:[id number] for colon cancer screening, including IFOB vs. colonoscopy. Risks and benefits of both were discussed and patient voiced understanding. Pt elects for: IFOB.  PSA per urology.  Living will d/w pt.Wife is designated if he is incapacitated.  Diet and exercise d/w pt. he quit running but is biking on a peleton/rowing/treadmill.  He is exercising 5 days a week.   HIV prev neg 2002.  No recent gout flares.  D/w pt. He has tolerated HCTZ.    He is sleeping better after taking a stress management class.  It was helpful.  D/w pt.    L shoulder pain.  Pain sleeping on L side.  Worse recently.  Pain with overhead movement.  No exertional sx.  Pain with int rotation.  No specific injury recently.  He has some pain sleeping on R shoulder but less than the L.    He is still on T replacement with phlebotomy every 3 months.   Labs d/w pt.   Hypertension:    Using medication without problems or lightheadedness: yes Chest pain with exertion:no Edema:no Short of breath:no  Elevated Cholesterol: Using medications without problems: yes Muscle aches: no.  He has some mild paresthesias in the feet that is variable.  He is putting up with that  and prev d/w cardiology about options.  Diet compliance: d/w pt.   Exercise: yes  Work situation d/w pt.    PMH and SH reviewed  Meds, vitals, and allergies reviewed.   ROS: Per HPI.  Unless specifically indicated otherwise in HPI, the patient denies:  General: fever. Eyes: acute vision changes ENT: sore throat Cardiovascular: chest pain Respiratory: SOB GI: vomiting GU: dysuria Musculoskeletal: acute back pain Derm: acute rash Neuro: acute motor dysfunction Psych: worsening mood Endocrine: polydipsia Heme: bleeding Allergy: hayfever  GEN: nad, alert and oriented HEENT: ncat NECK: supple w/o LA CV: rrr. PULM: ctab, no inc wob ABD: soft, +bs EXT: no edema SKIN: no acute rash Left shoulder with intact range of motion but pain on overhead movement and especially with pain on internal more than external rotation.  He has pain with supraspinatus testing and positive impingement on testing.  AC joint is not tender.  Left shoulder internal rotation is clearly improved with scapular assist.  He does not have an arm drop.

## 2020-06-01 NOTE — Patient Instructions (Addendum)
Go to the lab on the way out.   If you have mychart we'll likely use that to update you.    Take care.  Glad to see you. I'll update urology and cardiology about your labs.   Use the shoulder exercises and try rowing since that seems to help and update me as needed.

## 2020-06-03 ENCOUNTER — Encounter: Payer: Self-pay | Admitting: Oncology

## 2020-06-03 DIAGNOSIS — M25519 Pain in unspecified shoulder: Secondary | ICD-10-CM | POA: Insufficient documentation

## 2020-06-03 NOTE — Assessment & Plan Note (Addendum)
His LDL is 77.  I am going to ask for cardiology input on his lipid management.  In the meantime he will continue Crestor 20 mg.  I thought that Zetia would be a reasonable addition but I wanted cardiology input.  I thank all involved.  He does have some mild paresthesia in his feet attributed to the Crestor but he can tolerate that.

## 2020-06-03 NOTE — Assessment & Plan Note (Signed)
He is still on T replacement with phlebotomy every 3 months.   Labs d/w pt. I will defer.  He agrees.

## 2020-06-03 NOTE — Assessment & Plan Note (Signed)
No recent gout flares.  D/w pt. He has tolerated HCTZ.   Would continue as is.

## 2020-06-03 NOTE — Assessment & Plan Note (Signed)
Tetanus 2011 Flu done 2021 Shingles d/w pt.  PNA not due covid vaccine 2021 D/w patient [id number] for colon cancer screening, including IFOB vs. colonoscopy. Risks and benefits of both were discussed and patient voiced understanding. Pt elects for: IFOB.  PSA per urology.  Living will d/w pt.Wife is designated if he is incapacitated.  Diet and exercise d/w pt. he quit running but is biking on a peleton/rowing/treadmill.  He is exercising 5 days a week.   HIV prev neg 2002.

## 2020-06-03 NOTE — Assessment & Plan Note (Signed)
Likely rotator cuff irritation.  He does feel better after he does rowing exercises and that would make sense given his findings of improved internal rotation with scapular manipulation.  Handout given to patient for home exercise program and discussed/explained.  He will update me as needed.  He likely has a good chance of improvement with home exercise program.

## 2020-06-03 NOTE — Assessment & Plan Note (Signed)
Living will d/w pt.  Wife is designated if he is incapacitated.   

## 2020-06-03 NOTE — Assessment & Plan Note (Signed)
Controlled.  Continue amlodipine and carvedilol along with valsartan hydrochlorothiazide.  He will update me as needed.

## 2020-06-04 ENCOUNTER — Encounter: Payer: Self-pay | Admitting: Oncology

## 2020-06-05 ENCOUNTER — Encounter: Payer: Self-pay | Admitting: Oncology

## 2020-06-05 ENCOUNTER — Inpatient Hospital Stay: Payer: Managed Care, Other (non HMO)

## 2020-06-05 ENCOUNTER — Other Ambulatory Visit: Payer: Self-pay

## 2020-06-05 ENCOUNTER — Inpatient Hospital Stay: Payer: Managed Care, Other (non HMO) | Attending: Oncology | Admitting: Oncology

## 2020-06-05 VITALS — BP 156/83 | HR 69 | Temp 97.5°F | Resp 18 | Wt 224.4 lb

## 2020-06-05 VITALS — BP 143/79 | HR 68

## 2020-06-05 DIAGNOSIS — E291 Testicular hypofunction: Secondary | ICD-10-CM

## 2020-06-05 DIAGNOSIS — D751 Secondary polycythemia: Secondary | ICD-10-CM | POA: Insufficient documentation

## 2020-06-05 NOTE — Progress Notes (Signed)
Hematology/Oncology note [facility] Telephone:([phone] Fax:([phone]   Patient Care Team: Tonia Ghent, MD as PCP - General (Family Medicine) Belva Crome, MD as PCP - Cardiology (Cardiology) Pa, [facility] (Urology) Festus Aloe, MD as Consulting Physician (Urology)  REFERRING PROVIDER: Tonia Ghent, MD  CHIEF COMPLAINTS/REASON FOR VISIT:  Follow-up for polycytosis  HISTORY OF PRESENTING ILLNESS:  Stephen [name] is a 59 y.o. male who was seen in consultation at the request of Damita Dunnings, Elveria Rising, MD for evaluation of polycytosis/erythrocytosis Patient does not have recent blood work in current EMR. Reviewed previous blood work that was scanned into EMR. 10/15/2018 hemoglobin 17.4, hematocrit 51.7 12/12/2018 hemoglobin 17.8, hematocrit 50.5. 01/04/2019 hemoglobin 17.2, hematocrit 48  Patient has been well testosterone supplementation for treatment of primary hypogonadism since 2015.  [facility].  Currently on testosterone 150 mg injection every 2 weeks Reports previous history of myocardial infarction, in 2060  Associated signs or symptoms: Denies weight loss, fever, chills, fatigue, night sweats.   Context:  Smoking history: Denies.  Former smoker. Testosterone supplements: See above. History of blood clots: Denies Daytime somnolence: Denies Family history of polycythemia: Father with erythrocytosis and need phlebotomy.  Per patient, father probably also had secondary erythrocytosis.  Details not clear.  Patient has had phlebotomy previously, obtained by urologist.  Reports that recently he had had a phlebotomy a few weeks ago.  1 week after phlebotomy, his hemoglobin was around 17.  I do not have his most recent blood work.  INTERVAL HISTORY Stephen [name] is a 59 y.o. male who has above history reviewed by me today presents for follow up visit for management of erythrocytosis Problems  and complaints are listed below: On testosterone. BP recently is high and BP medication was adjusted. No other new complaints.   Patient had work-up done at [facility]. Review of Systems  Constitutional: Negative for appetite change, chills, fatigue, fever and unexpected weight change.  HENT:   Negative for hearing loss and voice change.   Eyes: Negative for eye problems and icterus.  Respiratory: Negative for chest tightness, cough and shortness of breath.   Cardiovascular: Negative for chest pain and leg swelling.  Gastrointestinal: Negative for abdominal distention and abdominal pain.  Endocrine: Negative for hot flashes.  Genitourinary: Negative for difficulty urinating, dysuria and frequency.   Musculoskeletal: Negative for arthralgias.  Skin: Negative for itching and rash.  Neurological: Negative for light-headedness and numbness.  Hematological: Negative for adenopathy. Does not bruise/bleed easily.  Psychiatric/Behavioral: Negative for confusion.    MEDICAL HISTORY:  Past Medical History:  Diagnosis Date  . Achilles tendon injury    right  . CAD S/P percutaneous coronary angioplasty 05/25/2014  . ED (erectile dysfunction)   . Elevated glucose   . GERD (gastroesophageal reflux disease)   . Gout   . Hyperlipidemia   . Hypertension   . Morton's neuroma    2014  . Seasonal allergies    uses advair in May  . ST elevation myocardial infarction (STEMI) of anterior wall ([place]) 05/25/14  . Tobacco abuse, + cigars 05/27/2014    SURGICAL HISTORY: Past Surgical History:  Procedure Laterality Date  . CORONARY ANGIOPLASTY WITH STENT PLACEMENT  05/25/14   Resolute integrity 3.5 X 18 mm DES 3.75 mm  . Fx L forearm closed fx  as a child  . Hospital / asthma     multiple times as child  . LEFT HEART CATHETERIZATION WITH CORONARY ANGIOGRAM N/A 05/25/2014  Procedure: LEFT HEART CATHETERIZATION WITH CORONARY ANGIOGRAM;  Surgeon: Sinclair Grooms, MD;  Location: [facility] CATH LAB;  Service:  Cardiovascular;  Laterality: N/A;  . PERCUTANEOUS CORONARY STENT INTERVENTION (PCI-S)  05/25/2014   Procedure: PERCUTANEOUS CORONARY STENT INTERVENTION (PCI-S);  Surgeon: Sinclair Grooms, MD;  Location: [facility] CATH LAB;  Service: Cardiovascular;;  PROX LAD     SOCIAL HISTORY: Social History   Socioeconomic History  . Marital status: Married    Spouse name: Not on file  . Number of children: 1  . Years of education: Not on file  . Highest education level: Not on file  Occupational History  . Occupation: [employer]    CommentSecretary/administrator of special chemistries  Tobacco Use  . Smoking status: Former Smoker    Types: Cigars    Quit date: 05/25/2014    Years since quitting: 6.0  . Smokeless tobacco: Never Used  Substance and Sexual Activity  . Alcohol use: Yes    Alcohol/week: 2.0 standard drinks    Types: 2 Cans of beer per week    Comment: occ/episodic  . Drug use: No  . Sexual activity: Yes  Other Topics Concern  . Not on file  Social History Narrative   One son, 1 stepson   Married 2001   From [place]   Works for [employer].    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    Social Determinants of Health   Financial Resource Strain:   . Difficulty of Paying Living Expenses: Not on file  Food Insecurity:   . Worried About Charity fundraiser in the Last Year: Not on file  . Ran Out of Food in the Last Year: Not on file  Transportation Needs:   . Lack of Transportation (Medical): Not on file  . Lack of Transportation (Non-Medical): Not on file  Physical Activity:   . Days of Exercise per Week: Not on file  . Minutes of Exercise per Session: Not on file  Stress:   . Feeling of Stress : Not on file  Social Connections:   . Frequency of Communication with Friends and Family: Not on file  . Frequency of Social Gatherings with Friends and Family: Not on file  . Attends Religious Services: Not on file  . Active Member of Clubs or Organizations: Not on file   . Attends Archivist Meetings: Not on file  . Marital Status: Not on file  Intimate Partner Violence:   . Fear of Current or Ex-Partner: Not on file  . Emotionally Abused: Not on file  . Physically Abused: Not on file  . Sexually Abused: Not on file    FAMILY HISTORY: Family History  Problem Relation Age of Onset  . Stroke Mother   . Hypertension Father   . Dementia Father   . Stroke Father 36       Multiple  . AAA (abdominal aortic aneurysm) Father   . Hyperlipidemia Sister   . Hypertension Sister   . Alcohol abuse Brother        organ failure  . Prostate cancer Neg Hx   . Colon cancer Neg Hx     ALLERGIES:  is allergic to claritin-d 12 hour [loratadine-pseudoephedrine er], kiwi extract, penicillins, and simvastatin.  MEDICATIONS:  Current Outpatient Medications  Medication Sig Dispense Refill  . acetaminophen (TYLENOL) 500 MG tablet Take 500-1,000 mg by mouth daily as needed (pain).    Marland Kitchen amLODipine (NORVASC) 5 MG tablet  TAKE 1 TABLET BY MOUTH  DAILY 90 tablet 3  . aspirin 81 MG chewable tablet Chew 1 tablet (81 mg total) by mouth daily.    . carvedilol (COREG) 6.25 MG tablet Take 1 tablet (6.25 mg total) by mouth 2 (two) times daily. 180 tablet 3  . Coenzyme Q10 (CO Q 10) 100 MG CAPS Take 200 mg by mouth daily.    . rosuvastatin (CRESTOR) 20 MG tablet TAKE 1 TABLET BY MOUTH ONCE DAILY 90 tablet 3  . sildenafil (REVATIO) 20 MG tablet Take 20 mg to 100 mg by mouth daily as needed for erectile dysfunction.    Marland Kitchen testosterone cypionate (DEPOTESTOSTERONE CYPIONATE) 200 MG/ML injection Inject 0.75 mLs (150 mg total) into the muscle every 14 (fourteen) days.    . valsartan-hydrochlorothiazide (DIOVAN-HCT) 320-12.5 MG tablet TAKE 1 TABLET BY MOUTH ONCE DAILY 90 tablet 3  . colchicine 0.6 MG tablet TAKE 1 TABLET BY MOUTH  DAILY AS NEEDED (Patient not taking: Reported on 06/05/2020) 90 tablet 1  . Fluticasone-Salmeterol (WIXELA INHUB) 100-50 MCG/DOSE AEPB Inhale 1 puff  into the lungs as needed (allergies). (Patient not taking: Reported on 06/05/2020)    . Ketotifen Fumarate (ALAWAY OP) Place 1 drop into both eyes daily as needed (seasonal allergies). (Patient not taking: Reported on 06/05/2020)    . nitroGLYCERIN (NITROSTAT) 0.4 MG SL tablet Place 1 tablet (0.4 mg total) under the tongue every 5 (five) minutes as needed for chest pain. (Patient not taking: Reported on 06/05/2020) 25 tablet 3   No current facility-administered medications for this visit.     PHYSICAL EXAMINATION: ECOG PERFORMANCE STATUS: 0 - Asymptomatic Vitals:   06/05/20 1320  BP: (!) 156/83  Pulse: 69  Resp: 18  Temp: (!) 97.5 F (36.4 C)   Filed Weights   06/05/20 1320  Weight: 224 lb 6.4 oz (101.8 kg)    Physical Exam Constitutional:      General: He is not in acute distress. HENT:     Head: Normocephalic and atraumatic.  Eyes:     General: No scleral icterus.    Pupils: Pupils are equal, round, and reactive to light.  Cardiovascular:     Rate and Rhythm: Normal rate and regular rhythm.     Heart sounds: Normal heart sounds.  Pulmonary:     Effort: Pulmonary effort is normal. No respiratory distress.     Breath sounds: No wheezing.  Abdominal:     General: Bowel sounds are normal. There is no distension.     Palpations: Abdomen is soft. There is no mass.     Tenderness: There is no abdominal tenderness.  Musculoskeletal:        General: No deformity. Normal range of motion.     Cervical back: Normal range of motion and neck supple.  Skin:    General: Skin is warm and dry.     Findings: No erythema or rash.  Neurological:     Mental Status: He is alert and oriented to person, place, and time. Mental status is at baseline.     Cranial Nerves: No cranial nerve deficit.     Coordination: Coordination normal.  Psychiatric:        Mood and Affect: Mood normal.        Behavior: Behavior normal.        Thought Content: Thought content normal.     RADIOGRAPHIC  STUDIES: I have personally reviewed the radiological images as listed and agreed with the findings in the report. No results  found.   LABORATORY DATA:  I have reviewed the data as listed Lab Results  Component Value Date   WBC 7.0 05/25/2020   HGB 16.3 05/25/2020   HCT 49.2 05/25/2020   MCV 90 05/25/2020   PLT 259 05/25/2020   Recent Labs    05/25/20 0738  NA 136  K 4.5  CL 99  CO2 27  GLUCOSE 102*  BUN 15  CREATININE 1.11  CALCIUM 9.1  GFRNONAA 73  GFRAA 84  PROT 6.9  ALBUMIN 4.3  AST 15  ALT 25  ALKPHOS 86  BILITOT 0.3   Iron/TIBC/Ferritin/ %Sat No results found for: IRON, TIBC, FERRITIN, IRONPCTSAT   [facility] results 06/02/2020 Hb 17.2, Hct 50.7   ASSESSMENT & PLAN:  1. Secondary erythrocytosis   2. Hypogonadism in male    #Secondary erythrocytosis  Labs are reviewed and discussed with patient. Hct is >50.  Proceed with phlebotomy 500cc today.   #Hypogonadism in male, patient is on testosterone treatments.  Continue follow up with urology.   Follow-up in 3 months. We spent sufficient time to discuss many aspect of care, questions were answered to patient's satisfaction. The patient knows to call the clinic with any problems questions or concerns.  Earlie Server, MD, PhD 06/05/2020

## 2020-06-05 NOTE — Progress Notes (Signed)
Pt here for follow up. No new concerns voiced.   

## 2020-06-05 NOTE — Progress Notes (Signed)
HCT 50.7 on 06/02/20, per Dr. Tasia Catchings goal HCT is <50. Per Dr. Tasia Catchings proceed with scheduled 500cc phlebotomy.  1422: Pt tolerated procedure well. No s/s of distress noted. Pt and VS stable at discharge.

## 2020-06-10 ENCOUNTER — Other Ambulatory Visit: Payer: Self-pay | Admitting: Interventional Cardiology

## 2020-06-11 LAB — FECAL OCCULT BLOOD, IMMUNOCHEMICAL: Fecal Occult Bld: NEGATIVE

## 2020-06-16 ENCOUNTER — Other Ambulatory Visit: Payer: Self-pay

## 2020-06-16 MED ORDER — COLCHICINE 0.6 MG PO TABS
0.6000 mg | ORAL_TABLET | Freq: Every day | ORAL | 0 refills | Status: DC | PRN
Start: 2020-06-16 — End: 2023-04-24

## 2020-06-16 NOTE — Telephone Encounter (Signed)
Rx sent electronically.  

## 2020-08-26 ENCOUNTER — Encounter: Payer: Self-pay | Admitting: Oncology

## 2020-09-04 ENCOUNTER — Encounter: Payer: Self-pay | Admitting: Oncology

## 2020-09-04 ENCOUNTER — Inpatient Hospital Stay: Payer: Managed Care, Other (non HMO)

## 2020-09-04 ENCOUNTER — Inpatient Hospital Stay: Payer: Managed Care, Other (non HMO) | Attending: Oncology | Admitting: Oncology

## 2020-09-04 VITALS — BP 140/82 | HR 76 | Resp 18

## 2020-09-04 VITALS — BP 148/81 | HR 73 | Temp 97.3°F | Wt 227.7 lb

## 2020-09-04 DIAGNOSIS — D751 Secondary polycythemia: Secondary | ICD-10-CM

## 2020-09-04 DIAGNOSIS — R202 Paresthesia of skin: Secondary | ICD-10-CM | POA: Diagnosis not present

## 2020-09-04 DIAGNOSIS — G629 Polyneuropathy, unspecified: Secondary | ICD-10-CM

## 2020-09-04 DIAGNOSIS — R200 Anesthesia of skin: Secondary | ICD-10-CM

## 2020-09-04 NOTE — Progress Notes (Signed)
Patient here for follow up. No new concers voiced.

## 2020-09-04 NOTE — Progress Notes (Signed)
Hematology/Oncology note [facility] [phone] [phone]   Patient Care Team: Tonia Ghent, MD as PCP - General (Family Medicine) Belva Crome, MD as PCP - Cardiology (Cardiology) Pa, [facility] (Urology) Festus Aloe, MD as Consulting Physician (Urology)  REFERRING PROVIDER: Tonia Ghent, MD  CHIEF COMPLAINTS/REASON FOR VISIT:  Follow-up for polycytosis  HISTORY OF PRESENTING ILLNESS:  Stephen [name] is a 60 y.o. male who was seen in consultation at the request of Damita Dunnings, Elveria Rising, MD for evaluation of polycytosis/erythrocytosis Patient does not have recent blood work in current EMR. Reviewed previous blood work that was scanned into EMR. 10/15/2018 hemoglobin 17.4, hematocrit 51.7 12/12/2018 hemoglobin 17.8, hematocrit 50.5. 01/04/2019 hemoglobin 17.2, hematocrit 48  Patient has been well testosterone supplementation for treatment of primary hypogonadism since 2015.  [facility].  Currently on testosterone 150 mg injection every 2 weeks Reports previous history of myocardial infarction, in 2060  Associated signs or symptoms: Denies weight loss, fever, chills, fatigue, night sweats.   Context:  Smoking history: Denies.  Former smoker. Testosterone supplements: See above. History of blood clots: Denies Daytime somnolence: Denies Family history of polycythemia: Father with erythrocytosis and need phlebotomy.  Per patient, father probably also had secondary erythrocytosis.  Details not clear.  Patient has had phlebotomy previously, obtained by urologist.  Reports that recently he had had a phlebotomy a few weeks ago.  1 week after phlebotomy, his hemoglobin was around 17.  I do not have his most recent blood work.  INTERVAL HISTORY Stephen [name] is a 60 y.o. male who has above history reviewed by me today presents for follow up visit for management of erythrocytosis Problems  and complaints are listed below: On testosterone.  Patient reports chronic intermittent bilateral lower extremity numbness and tingling.  He does not have diabetes.  Patient had work-up done at [facility]. Review of Systems  Constitutional: Negative for appetite change, chills, fatigue, fever and unexpected weight change.  HENT:   Negative for hearing loss and voice change.   Eyes: Negative for eye problems and icterus.  Respiratory: Negative for chest tightness, cough and shortness of breath.   Cardiovascular: Negative for chest pain and leg swelling.  Gastrointestinal: Negative for abdominal distention and abdominal pain.  Endocrine: Negative for hot flashes.  Genitourinary: Negative for difficulty urinating, dysuria and frequency.   Musculoskeletal: Negative for arthralgias.  Skin: Negative for itching and rash.  Neurological: Positive for numbness. Negative for light-headedness.  Hematological: Negative for adenopathy. Does not bruise/bleed easily.  Psychiatric/Behavioral: Negative for confusion.    MEDICAL HISTORY:  Past Medical History:  Diagnosis Date  . Achilles tendon injury    right  . CAD S/P percutaneous coronary angioplasty 05/25/2014  . ED (erectile dysfunction)   . Elevated glucose   . GERD (gastroesophageal reflux disease)   . Gout   . Hyperlipidemia   . Hypertension   . Morton's neuroma    2014  . Seasonal allergies    uses advair in May  . ST elevation myocardial infarction (STEMI) of anterior wall ([place]) 05/25/14  . Tobacco abuse, + cigars 05/27/2014    SURGICAL HISTORY: Past Surgical History:  Procedure Laterality Date  . CORONARY ANGIOPLASTY WITH STENT PLACEMENT  05/25/14   Resolute integrity 3.5 X 18 mm DES 3.75 mm  . Fx L forearm closed fx  as a child  . Hospital / asthma     multiple times as child  . LEFT HEART CATHETERIZATION WITH CORONARY  ANGIOGRAM N/A 05/25/2014   Procedure: LEFT HEART CATHETERIZATION WITH CORONARY ANGIOGRAM;  Surgeon: Sinclair Grooms, MD;  Location: [facility] CATH LAB;  Service: Cardiovascular;  Laterality: N/A;  . PERCUTANEOUS CORONARY STENT INTERVENTION (PCI-S)  05/25/2014   Procedure: PERCUTANEOUS CORONARY STENT INTERVENTION (PCI-S);  Surgeon: Sinclair Grooms, MD;  Location: [facility] CATH LAB;  Service: Cardiovascular;;  PROX LAD     SOCIAL HISTORY: Social History   Socioeconomic History  . Marital status: Married    Spouse name: Not on file  . Number of children: 1  . Years of education: Not on file  . Highest education level: Not on file  Occupational History  . Occupation: [employer]    CommentSecretary/administrator of special chemistries  Tobacco Use  . Smoking status: Former Smoker    Types: Cigars    Quit date: 05/25/2014    Years since quitting: 6.2  . Smokeless tobacco: Never Used  Substance and Sexual Activity  . Alcohol use: Yes    Alcohol/week: 2.0 standard drinks    Types: 2 Cans of beer per week    Comment: occ/episodic  . Drug use: No  . Sexual activity: Yes  Other Topics Concern  . Not on file  Social History Narrative   One son, 1 stepson   Married 2001   From [place]   Works for [employer].    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    Social Determinants of Health   Financial Resource Strain: Not on file  Food Insecurity: Not on file  Transportation Needs: Not on file  Physical Activity: Not on file  Stress: Not on file  Social Connections: Not on file  Intimate Partner Violence: Not on file    FAMILY HISTORY: Family History  Problem Relation Age of Onset  . Stroke Mother   . Hypertension Father   . Dementia Father   . Stroke Father 73       Multiple  . AAA (abdominal aortic aneurysm) Father   . Hyperlipidemia Sister   . Hypertension Sister   . Alcohol abuse Brother        organ failure  . Prostate cancer Neg Hx   . Colon cancer Neg Hx     ALLERGIES:  is allergic to claritin-d 12 hour [loratadine-pseudoephedrine er], kiwi extract, penicillins, and  simvastatin.  MEDICATIONS:  Current Outpatient Medications  Medication Sig Dispense Refill  . acetaminophen (TYLENOL) 500 MG tablet Take 500-1,000 mg by mouth daily as needed (pain).    Marland Kitchen amLODipine (NORVASC) 5 MG tablet TAKE 1 TABLET BY MOUTH  DAILY 90 tablet 3  . aspirin 81 MG chewable tablet Chew 1 tablet (81 mg total) by mouth daily.    . carvedilol (COREG) 6.25 MG tablet Take 1 tablet (6.25 mg total) by mouth 2 (two) times daily. 180 tablet 3  . Coenzyme Q10 (CO Q 10) 100 MG CAPS Take 200 mg by mouth daily.    . rosuvastatin (CRESTOR) 20 MG tablet TAKE 1 TABLET BY MOUTH  DAILY 90 tablet 3  . sildenafil (REVATIO) 20 MG tablet Take 20 mg to 100 mg by mouth daily as needed for erectile dysfunction.    Marland Kitchen testosterone cypionate (DEPOTESTOSTERONE CYPIONATE) 200 MG/ML injection Inject 0.75 mLs (150 mg total) into the muscle every 14 (fourteen) days.    . valsartan-hydrochlorothiazide (DIOVAN-HCT) 320-12.5 MG tablet TAKE 1 TABLET BY MOUTH  DAILY 90 tablet 3  . carvedilol (COREG) 3.125 MG tablet TAKE 1  TABLET BY MOUTH  TWICE DAILY (Patient not taking: Reported on 09/04/2020) 180 tablet 3  . colchicine 0.6 MG tablet Take 1 tablet (0.6 mg total) by mouth daily as needed. (Patient not taking: Reported on 09/04/2020) 90 tablet 0  . Fluticasone-Salmeterol (ADVAIR) 100-50 MCG/DOSE AEPB Inhale 1 puff into the lungs as needed (allergies). (Patient not taking: Reported on 09/04/2020)    . Ketotifen Fumarate (ALAWAY OP) Place 1 drop into both eyes daily as needed (seasonal allergies). (Patient not taking: Reported on 09/04/2020)    . nitroGLYCERIN (NITROSTAT) 0.4 MG SL tablet Place 1 tablet (0.4 mg total) under the tongue every 5 (five) minutes as needed for chest pain. (Patient not taking: No sig reported) 25 tablet 3   No current facility-administered medications for this visit.     PHYSICAL EXAMINATION: ECOG PERFORMANCE STATUS: 0 - Asymptomatic Vitals:   09/04/20 1350  BP: (!) 148/81  Pulse: 73  Temp:  (!) 97.3 F (36.3 C)  SpO2: 97%   Filed Weights   09/04/20 1350  Weight: 227 lb 11.2 oz (103.3 kg)    Physical Exam Constitutional:      General: He is not in acute distress. HENT:     Head: Normocephalic and atraumatic.  Eyes:     General: No scleral icterus.    Pupils: Pupils are equal, round, and reactive to light.  Cardiovascular:     Rate and Rhythm: Normal rate and regular rhythm.     Heart sounds: Normal heart sounds.  Pulmonary:     Effort: Pulmonary effort is normal. No respiratory distress.     Breath sounds: No wheezing.  Abdominal:     General: Bowel sounds are normal. There is no distension.     Palpations: Abdomen is soft. There is no mass.     Tenderness: There is no abdominal tenderness.  Musculoskeletal:        General: No deformity. Normal range of motion.     Cervical back: Normal range of motion and neck supple.  Skin:    General: Skin is warm and dry.     Findings: No erythema or rash.  Neurological:     Mental Status: He is alert and oriented to person, place, and time. Mental status is at baseline.     Cranial Nerves: No cranial nerve deficit.     Coordination: Coordination normal.  Psychiatric:        Mood and Affect: Mood normal.        Behavior: Behavior normal.        Thought Content: Thought content normal.     RADIOGRAPHIC STUDIES: I have personally reviewed the radiological images as listed and agreed with the findings in the report. No results found.   LABORATORY DATA:  I have reviewed the data as listed Lab Results  Component Value Date   WBC 7.0 05/25/2020   HGB 16.3 05/25/2020   HCT 49.2 05/25/2020   MCV 90 05/25/2020   PLT 259 05/25/2020   Recent Labs    05/25/20 0738  NA 136  K 4.5  CL 99  CO2 27  GLUCOSE 102*  BUN 15  CREATININE 1.11  CALCIUM 9.1  GFRNONAA 73  GFRAA 84  PROT 6.9  ALBUMIN 4.3  AST 15  ALT 25  ALKPHOS 86  BILITOT 0.3   Iron/TIBC/Ferritin/ %Sat No results found for: IRON, TIBC, FERRITIN,  IRONPCTSAT   [facility] results 06/02/2020 Hb 17.2, Hct 50.7 08/25/2020 hemoglobin 17.1, hematocrit 52.2.  ASSESSMENT & PLAN:  1.  Erythrocytosis   2. Neuropathy   3. Numbness and tingling of both feet    #Secondary erythrocytosis  Labs reviewed and discussed with patient. Hct is >50.  Proceed with phlebotomy 500cc today.   #Hypogonadism in male, patient is on testosterone treatments.  Continue follow-up with urology. #Numbness, etiology unknown.  I gave patient prescription of protein electrophoresis. Refer to neurology for further evaluation.  Follow-up in 3 months. We spent sufficient time to discuss many aspect of care, questions were answered to patient's satisfaction. The patient knows to call the clinic with any problems questions or concerns.  Earlie Server, MD, PhD 09/04/2020

## 2020-09-11 ENCOUNTER — Encounter: Payer: Self-pay | Admitting: Oncology

## 2020-10-05 ENCOUNTER — Emergency Department
Admission: EM | Admit: 2020-10-05 | Discharge: 2020-10-05 | Disposition: A | Discharge status: Home / Self Care | Payer: Managed Care, Other (non HMO) | Attending: Emergency Medicine | Admitting: Emergency Medicine

## 2020-10-05 ENCOUNTER — Other Ambulatory Visit: Payer: Self-pay

## 2020-10-05 DIAGNOSIS — Z87891 Personal history of nicotine dependence: Secondary | ICD-10-CM | POA: Diagnosis not present

## 2020-10-05 DIAGNOSIS — I10 Essential (primary) hypertension: Secondary | ICD-10-CM | POA: Diagnosis not present

## 2020-10-05 DIAGNOSIS — K645 Perianal venous thrombosis: Secondary | ICD-10-CM | POA: Diagnosis not present

## 2020-10-05 DIAGNOSIS — J45909 Unspecified asthma, uncomplicated: Secondary | ICD-10-CM | POA: Insufficient documentation

## 2020-10-05 DIAGNOSIS — I2510 Atherosclerotic heart disease of native coronary artery without angina pectoris: Secondary | ICD-10-CM | POA: Insufficient documentation

## 2020-10-05 DIAGNOSIS — Z7982 Long term (current) use of aspirin: Secondary | ICD-10-CM | POA: Insufficient documentation

## 2020-10-05 DIAGNOSIS — Z79899 Other long term (current) drug therapy: Secondary | ICD-10-CM | POA: Diagnosis not present

## 2020-10-05 MED ORDER — OXYCODONE-ACETAMINOPHEN 5-325 MG PO TABS
1.0000 | ORAL_TABLET | Freq: Four times a day (QID) | ORAL | 0 refills | Status: AC | PRN
Start: 2020-10-05 — End: 2021-10-05

## 2020-10-05 MED ORDER — LIDOCAINE HCL (PF) 1 % IJ SOLN
5.0000 mL | Freq: Once | INTRAMUSCULAR | Status: AC
  Administered 2020-10-05: 5 mL
  Filled 2020-10-05: qty 5

## 2020-10-05 MED ORDER — LIDOCAINE VISCOUS HCL 2 % MT SOLN
15.0000 mL | Freq: Once | OROMUCOSAL | Status: AC
  Administered 2020-10-05: 15 mL via OROMUCOSAL
  Filled 2020-10-05: qty 15

## 2020-10-05 NOTE — ED Triage Notes (Signed)
Pt c/o painful external hemorrhoids for the past several days, states he has been using stools softeners and ice packs with no relief, states he took a oxycodone left over from back issues he had due to the pain last night

## 2020-10-05 NOTE — ED Provider Notes (Signed)
[facility] Emergency Department Provider Note  ____________________________________________   Event Date/Time   First MD Initiated Contact with Patient 10/05/20 0813     (approximate)  I have reviewed the triage vital signs and the nursing notes.   HISTORY  Chief Complaint Hemorrhoids   HPI Stephen [name] is a 60 y.o. male presents to the ED with complaint of painful external hemorrhoids for the last several days.  Patient states he has been doing sits baths with [place] salt and taking stool softeners without any relief.  Patient had a thrombosed hemorrhoid 10 years ago and has not had any problems since that time.  He states it became so painful last night he took left over oxycodone until he could be seen in the emergency department.  Currently rates his pain as 5 out of 10.     Past Medical History:  Diagnosis Date  . Achilles tendon injury    right  . CAD S/P percutaneous coronary angioplasty 05/25/2014  . ED (erectile dysfunction)   . Elevated glucose   . GERD (gastroesophageal reflux disease)   . Gout   . Hyperlipidemia   . Hypertension   . Morton's neuroma    2014  . Seasonal allergies    uses advair in May  . ST elevation myocardial infarction (STEMI) of anterior wall ([place]) 05/25/14  . Tobacco abuse, + cigars 05/27/2014    Patient Active Problem List   Diagnosis Date Noted  . Shoulder pain 06/03/2020  . Sciatica 08/09/2019  . Erythrocytosis 06/07/2019  . Other social stressor 04/02/2017  . Insomnia 02/18/2016  . Routine general medical examination at a health care facility 02/06/2015  . Tobacco abuse, + cigars 05/27/2014  . Prediabetes 05/26/2014  . ST elevation (STEMI) myocardial infarction involving left anterior descending coronary artery ([place]) 05/25/2014  . Presence of drug coated stent in pLAD -- Resolute Integrity 3 5 x 18 mm DES (3.75 mm) 05/25/2014    Class: Hospitalized for  . CAD in native artery 05/25/2014    Class:  Acute  . Advance care planning 02/23/2014  . Hypogonadism male 08/27/2013  . FHx: AAA 08/26/2013  . Back pain 09/10/2012  . Paresthesia 09/10/2012  . Hemorrhoids, external 11/03/2011  . ALLERGIC RHINITIS 11/14/2007  . Hyperlipidemia with target LDL less than 70 03/28/2007  . GOUT 03/28/2007  . Essential hypertension 03/28/2007  . GERD 03/28/2007    Past Surgical History:  Procedure Laterality Date  . CORONARY ANGIOPLASTY WITH STENT PLACEMENT  05/25/14   Resolute integrity 3.5 X 18 mm DES 3.75 mm  . Fx L forearm closed fx  as a child  . Hospital / asthma     multiple times as child  . LEFT HEART CATHETERIZATION WITH CORONARY ANGIOGRAM N/A 05/25/2014   Procedure: LEFT HEART CATHETERIZATION WITH CORONARY ANGIOGRAM;  Surgeon: Sinclair Grooms, MD;  Location: [facility] CATH LAB;  Service: Cardiovascular;  Laterality: N/A;  . PERCUTANEOUS CORONARY STENT INTERVENTION (PCI-S)  05/25/2014   Procedure: PERCUTANEOUS CORONARY STENT INTERVENTION (PCI-S);  Surgeon: Sinclair Grooms, MD;  Location: [facility] CATH LAB;  Service: Cardiovascular;;  PROX LAD     Prior to Admission medications   Medication Sig Start Date End Date Taking? Authorizing Provider  oxyCODONE-acetaminophen (PERCOCET) 5-325 MG tablet Take 1 tablet by mouth every 6 (six) hours as needed for severe pain. 10/05/20 10/05/21 Yes Manju Kulkarni L, PA-C  amLODipine (NORVASC) 5 MG tablet TAKE 1 TABLET BY MOUTH  DAILY 02/03/20   Truitt Merle  C, NP  aspirin 81 MG chewable tablet Chew 1 tablet (81 mg total) by mouth daily. 05/27/14   Isaiah Serge, NP  carvedilol (COREG) 3.125 MG tablet TAKE 1 TABLET BY MOUTH  TWICE DAILY Patient not taking: Reported on 09/04/2020 06/10/20   Belva Crome, MD  carvedilol (COREG) 6.25 MG tablet Take 1 tablet (6.25 mg total) by mouth 2 (two) times daily. 05/18/20   Belva Crome, MD  Coenzyme Q10 (CO Q 10) 100 MG CAPS Take 200 mg by mouth daily.    [provider]  colchicine 0.6 MG tablet Take 1 tablet  (0.6 mg total) by mouth daily as needed. Patient not taking: Reported on 09/04/2020 06/16/20   Tonia Ghent, MD  Fluticasone-Salmeterol (ADVAIR) 100-50 MCG/DOSE AEPB Inhale 1 puff into the lungs as needed (allergies). Patient not taking: Reported on 09/04/2020    [provider]  Ketotifen Fumarate (ALAWAY OP) Place 1 drop into both eyes daily as needed (seasonal allergies). Patient not taking: Reported on 09/04/2020    [provider]  nitroGLYCERIN (NITROSTAT) 0.4 MG SL tablet Place 1 tablet (0.4 mg total) under the tongue every 5 (five) minutes as needed for chest pain. Patient not taking: No sig reported 05/18/20   Belva Crome, MD  rosuvastatin (CRESTOR) 20 MG tablet TAKE 1 TABLET BY MOUTH  DAILY 06/10/20   Belva Crome, MD  sildenafil (REVATIO) 20 MG tablet Take 20 mg to 100 mg by mouth daily as needed for erectile dysfunction.    [provider]  testosterone cypionate (DEPOTESTOSTERONE CYPIONATE) 200 MG/ML injection Inject 0.75 mLs (150 mg total) into the muscle every 14 (fourteen) days. 04/09/18   Tonia Ghent, MD  valsartan-hydrochlorothiazide (DIOVAN-HCT) 320-12.5 MG tablet TAKE 1 TABLET BY MOUTH  DAILY 06/10/20   Belva Crome, MD    Allergies Claritin-d 12 hour [loratadine-pseudoephedrine er], Kiwi extract, Penicillins, and Simvastatin  Family History  Problem Relation Age of Onset  . Stroke Mother   . Hypertension Father   . Dementia Father   . Stroke Father 34       Multiple  . AAA (abdominal aortic aneurysm) Father   . Hyperlipidemia Sister   . Hypertension Sister   . Alcohol abuse Brother        organ failure  . Prostate cancer Neg Hx   . Colon cancer Neg Hx     Social History Social History   Tobacco Use  . Smoking status: Former Smoker    Types: Cigars    Quit date: 05/25/2014    Years since quitting: 6.3  . Smokeless tobacco: Never Used  Substance Use Topics  . Alcohol use: Yes    Alcohol/week: 2.0 standard drinks     Types: 2 Cans of beer per week    Comment: occ/episodic  . Drug use: No    Review of Systems Constitutional: No fever/chills Cardiovascular: Denies chest pain. Respiratory: Denies shortness of breath. Gastrointestinal: No abdominal pain.  No nausea, no vomiting.  No diarrhea.  Positive for hemorrhoids. Musculoskeletal: Negative for musculoskeletal pain. Skin: Negative for rash. Neurological: Negative for headaches, focal weakness or numbness. ____________________________________________   PHYSICAL EXAM:  VITAL SIGNS: ED Triage Vitals  Enc Vitals Group     BP 10/05/20 0734 (!) 164/83     Pulse Rate 10/05/20 0734 74     Resp 10/05/20 0734 16     Temp 10/05/20 0734 (!) 97.5 F (36.4 C)     Temp Source 10/05/20 0734  Oral     SpO2 10/05/20 0735 97 %     Weight 10/05/20 0736 220 lb (99.8 kg)     Height 10/05/20 0736 5\' 7"  (1.702 m)     Head Circumference --      Peak Flow --      Pain Score 10/05/20 0736 5     Pain Loc --      Pain Edu? --      Excl. in [place]? --     Constitutional: Alert and oriented. Well appearing and in no acute distress. Eyes: Conjunctivae are normal. PERRL. EOMI. Head: Atraumatic. Neck: No stridor.   Cardiovascular: Normal rate, regular rhythm. Grossly normal heart sounds.  Good peripheral circulation. Respiratory: Normal respiratory effort.  No retractions. Lungs CTAB. Gastrointestinal: Large external thrombosed hemorrhoid present.  Markedly tender to palpation. Musculoskeletal: Moves upper and lower extremities with any difficulty normal gait was noted. Neurologic:  Normal speech and language. No gross focal neurologic deficits are appreciated. No gait instability. Skin:  Skin is warm, dry and intact. No rash noted. Psychiatric: Mood and affect are normal. Speech and behavior are normal.  ____________________________________________   LABS (all labs ordered are listed, but only abnormal results are displayed)  Labs Reviewed - No data to  display ____________________________________________    PROCEDURES  Procedure(s) performed (including Critical Care):  Marland KitchenMarland KitchenIncision and Drainage  Date/Time: 10/05/2020 9:31 AM Performed by: Johnn Hai, PA-C Authorized by: Johnn Hai, PA-C   Consent:    Consent obtained:  Verbal   Consent given by:  Patient   Risks discussed:  Incomplete drainage Universal protocol:    Procedure explained and questions answered to patient or proxy's satisfaction: yes   Location:    Type:  External thrombosed hemorrhoid Pre-procedure details:    Skin preparation:  Antiseptic wash Sedation:    Sedation type:  None Anesthesia:    Anesthesia method:  Topical application and local infiltration   Topical anesthetic:  Lidocaine gel   Local anesthetic:  Lidocaine 1% w/o epi Procedure type:    Complexity:  Simple Procedure details:    Incision types:  Single straight   Incision depth:  Dermal   Drainage amount:  Moderate Post-procedure details:    Procedure completion:  Tolerated well, no immediate complications Comments:     Multiple large clots were removed from the hemorrhoid and multiple small ones.     ____________________________________________   INITIAL IMPRESSION / ASSESSMENT AND PLAN / ED COURSE  As part of my medical decision making, I reviewed the following data within the electronic MEDICAL RECORD NUMBER Notes from prior ED visits and [place] Controlled Substance Database  60 year old male presents to the ED with complaint of external hemorrhoid.  Patient had a thrombosed hemorrhoid approximately 10 years ago and this feels very similar.  Patient tolerated the procedure extremely well and multiple large clots and small ones were removed.  Patient tolerated the procedure well.  He was instructed to do frequent sits baths today.  A prescription for pain medication was sent to his pharmacy.  He is aware that this could increase his chances for constipation and will take stool  softener with this.  We also discussed that should he continue to have problems with hemorrhoids that he should see a Psychologist, sport and exercise.  ____________________________________________   FINAL CLINICAL IMPRESSION(S) / ED DIAGNOSES  Final diagnoses:  Thrombosed external hemorrhoid     ED Discharge Orders         Ordered    oxyCODONE-acetaminophen (PERCOCET) 5-325  MG tablet  Every 6 hours PRN        10/05/20 1003          *Please note:  Anel Purohit was evaluated in Emergency Department on 10/05/2020 for the symptoms described in the history of present illness. He was evaluated in the context of the global COVID-19 pandemic, which necessitated consideration that the patient might be at risk for infection with the SARS-CoV-2 virus that causes COVID-19. Institutional protocols and algorithms that pertain to the evaluation of patients at risk for COVID-19 are in a state of rapid change based on information released by regulatory bodies including the CDC and federal and state organizations. These policies and algorithms were followed during the patient's care in the ED.  Some ED evaluations and interventions may be delayed as a result of limited staffing during and the pandemic.*   Note:  This document was prepared using Dragon voice recognition software and may include unintentional dictation errors.    Johnn Hai, PA-C 10/05/20 1346    Lavonia Drafts, MD 10/05/20 [phone]

## 2020-10-05 NOTE — ED Notes (Signed)
See triage note  Presents with hemorrhoid pain   States he has been having increased pain for the past few days

## 2020-10-05 NOTE — Discharge Instructions (Signed)
Follow-up with your primary care provider if any continued problems.  Also the name of the surgeon who is on-call is listed on your discharge papers if any continued problems.  Continue with your sits baths 2-3 times today especially.  A prescription for Percocet was sent to your pharmacy as needed for pain.  Do not take additional Tylenol with this medication as it already contains Tylenol.  Take a stool softener to prevent constipation.  If any continued problems or worsening of your symptoms return to the emergency department.

## 2020-10-08 ENCOUNTER — Ambulatory Visit: Payer: Managed Care, Other (non HMO) | Admitting: Family Medicine

## 2020-10-12 ENCOUNTER — Ambulatory Visit: Payer: Self-pay | Admitting: Surgery

## 2020-10-12 ENCOUNTER — Telehealth: Payer: Self-pay | Admitting: *Deleted

## 2020-10-12 NOTE — Telephone Encounter (Signed)
   [facility] Pre-operative Risk Assessment    HEARTCARE STAFF: - Please ensure there is not already an duplicate clearance open for this procedure. - Under Visit Info/Reason for Call, type in Other and utilize the format Clearance MM/DD/YY or Clearance TBD. Do not use dashes or single digits. - If request is for dental extraction, please clarify the # of teeth to be extracted.  Request for surgical clearance:  1. What type of surgery is being performed? HEMORRHOIDECTOMY   2. When is this surgery scheduled? 10/29/20   3. What type of clearance is required (medical clearance vs. Pharmacy clearance to hold med vs. Both)? MEDICAL  4. Are there any medications that need to be held prior to surgery and how long? ASA   5. Practice name and name of physician performing surgery? [place]; [place], DO   6. What is the office phone number? [phone]   7.   What is the office fax number? [phone]  8.   Anesthesia type (None, local, MAC, general) ? NOT LISTED   Julaine Hua 10/12/2020, 11:48 AM  _________________________________________________________________   (provider comments below)

## 2020-10-12 NOTE — Telephone Encounter (Signed)
Dr. Tamala Julian-  Can you speak to ASA holding recommendations for this patient prior to hemoridectomy?   He has a hx of STEMI with PCI/DES to LAD 05/2014, HTN, prior tobacco use, and ED. Last time you saw him he was without chest pain or other anginal symptoms. His BP was slightly elevated and medications changes were made.   Please send your recommendations to the pre-op pool  Thank you Sharee Pimple

## 2020-10-12 NOTE — H&P (Signed)
Subjective:  CC: Hemorrhoids, external [K64.4]   HPI:  Joshawa Dubin is a 60 y.o. male who was referrred by [facility] for above. Symptoms were first noted 1 week ago.Pain is sharp, confined to the perianal area, without radiation.  Associated with scant bleeding, exacerbated by nothing specific.  Recently incised in ED, but feels it is starting to swell again.  Toilet habits: One to two daily, soft, BMs   No history of colonoscopy.   Past Medical History:  has a past medical history of History of myocardial infarction (05/25/2014), Hyperlipidemia (03/28/2007), and Hypertension (03/28/2007).  Past Surgical History:  has no past surgical history on file.  Family History: family history includes Alcohol abuse in his brother; High blood pressure (Hypertension) in his sister; Hyperlipidemia (Elevated cholesterol) in his sister.  Social History:  reports that he quit smoking about 6 years ago. His smoking use included cigars. He started smoking about 7 years ago. He has a 1.00 pack-year smoking history. He does not have any smokeless tobacco history on file. He reports that he does not drink alcohol and does not use drugs.  Current Medications: has a current medication list which includes the following prescription(s): amlodipine, carvedilol, co-enzyme q-10 (ubiquinone), colchicine, fluticasone propion-salmeterol, nitroglycerin, rosuvastatin, sildenafil, testosterone cypionate, and valsartan-hydrochlorothiazide.  Allergies:  Allergies as of 10/12/2020 - Reviewed 10/12/2020  Allergen Reaction Noted  . Claritin [loratadine] Anxiety 10/12/2020  . Kiwi (actinidia chinensis) Anaphylaxis 10/12/2020  . Penicillins Unknown 10/12/2020    ROS:  A 15 point review of systems was performed and pertinent positives and negatives noted in HPI  Objective:     BP (!) 145/80   Pulse 65   Ht 170.2 cm (5\' 7" ) Comment: per pt  Wt 99.8 kg (220 lb) Comment: per pt  BMI 34.46 kg/m    Constitutional :  alert, appears stated age, cooperative and no distress  Lymphatics/Throat::  no asymmetry, masses, or scars  Respiratory:  clear to auscultation bilaterally  Cardiovascular:  regular rate and rhythm  Gastrointestinal: soft, non-tender; bowel sounds normal; no masses,  no organomegaly.    Musculoskeletal: Steady gait and movement  Skin: Cool and moist  Psychiatric: Normal affect, non-agitated, not confused  Genital/Rectal: External exam positive for swollen external RA hemorrhoid with no obvious ulceration, or recurrent thrombosis, minimal TTP.    LABS:  n/a   RADS: n/a Assessment:     Hemorrhoids, external [K64.4] , possible persistent/recurrent, but no obvious thrombosis and minimally symptomatic today.  Plan:    1. Hemorrhoids, external [K64.4] Discussed risks/benefits/alternatives to surgery.  Alternatives include the options of observation, medical management.  Benefits include symptomatic relief.  I discussed  in detail and the complications related to the operation and the anesthesia, including bleeding, infection, recurrence, remote possibility of temporary or permanent fecal incontinence, poor/delayed wound healing, chronic pain, and additional procedures to address said risks. The risks of general anesthetic, if used, includes MI, CVA, sudden death or even reaction to anesthetic medications also discussed.   We also discussed typical post operative recovery which includes weeks to potentially months of anal pain, drainage, occasional bleeding, and sense of fecal urgency.    ED return precautions given for sudden increase in pain, bleeding, with possible accompanying fever, nausea, and/or vomiting.  The patient understands the risks, any and all questions were answered to the patient's satisfaction.  2. Patient has elected to proceed with surgical treatment for now to minimize recurrence. Can always cancel if this episode continues to resolve.  Procedure  will be scheduled.  Written consent was obtained.

## 2020-10-13 NOTE — Telephone Encounter (Signed)
same

## 2020-10-13 NOTE — Telephone Encounter (Signed)
Okay to hold aspirin. 

## 2020-10-13 NOTE — Telephone Encounter (Signed)
   Primary Cardiologist: Sinclair Grooms, MD  Chart reviewed as part of pre-operative protocol coverage. Given past medical history and time since last visit, based on ACC/AHA guidelines, Stephen [name] would be at acceptable risk for the planned procedure without further cardiovascular testing.   He has a hx of STEMI with PCI/DES to LAD 05/2014, HTN, prior tobacco use, and ED. Per Dr. Tamala Julian, it will be acceptable to hold ASA 2-5 days prior to the procedure and resume once stable per procedural team.   The patient was advised that if he develops new symptoms prior to surgery to contact our office to arrange for a follow-up visit, and he verbalized understanding.  I will route this recommendation to the requesting party via Epic fax function and remove from pre-op pool.  Please call with questions.  Kathyrn Drown, NP 10/13/2020, 9:39 AM

## 2020-10-14 ENCOUNTER — Encounter: Payer: Self-pay | Admitting: Family Medicine

## 2020-10-14 ENCOUNTER — Other Ambulatory Visit: Payer: Self-pay | Admitting: Family Medicine

## 2020-10-14 DIAGNOSIS — K644 Residual hemorrhoidal skin tags: Secondary | ICD-10-CM

## 2020-10-19 ENCOUNTER — Encounter (HOSPITAL_COMMUNITY): Payer: Self-pay | Admitting: Urgent Care

## 2020-10-19 ENCOUNTER — Encounter
Admission: RE | Admit: 2020-10-19 | Discharge: 2020-10-19 | Disposition: A | Discharge status: Home / Self Care | Payer: Managed Care, Other (non HMO) | Source: Ambulatory Visit | Attending: Surgery | Admitting: Surgery

## 2020-10-19 ENCOUNTER — Other Ambulatory Visit: Payer: Self-pay

## 2020-10-19 HISTORY — DX: Unspecified osteoarthritis, unspecified site: M19.90

## 2020-10-19 HISTORY — DX: Polyneuropathy, unspecified: G62.9

## 2020-10-19 HISTORY — DX: Secondary polycythemia: D75.1

## 2020-10-19 HISTORY — DX: Unspecified asthma, uncomplicated: J45.909

## 2020-10-19 NOTE — Patient Instructions (Signed)
Your procedure is scheduled on:10-29-20 THURSDAY Report to the Registration Desk on the 1st floor of the [facility]-Then proceed to the 2nd floor Surgery Desk in the [facility] To find out your arrival time, please call [phone] between 1PM - 3PM on:10-28-20 WEDNESDAY  REMEMBER: Instructions that are not followed completely may result in serious medical risk, up to and including death; or upon the discretion of your surgeon and anesthesiologist your surgery may need to be rescheduled.  Do not eat food after midnight the night before surgery.  No gum chewing, lozengers or hard candies.  You may however, drink CLEAR liquids up to 2 hours before you are scheduled to arrive for your surgery. Do not drink anything within 2 hours of your scheduled arrival time.  Clear liquids include: - water  - apple juice without pulp - gatorade (not RED) - black coffee or tea (Do NOT add milk or creamers to the coffee or tea) Do NOT drink anything that is not on this list.  TAKE THESE MEDICATIONS THE MORNING OF SURGERY WITH A SIP OF WATER: -COREG (CARVEDILOL)  Follow recommendations from Cardiologist, Pulmonologist or PCP regarding stopping Aspirin, Coumadin, Plavix, Eliquis, Pradaxa, or Pletal-INSTRUCTED BY SURGEON/CARDIOLOGIST TO STOP 81 MG ASPIRIN ON 10-23-20  One week prior to surgery: Stop Anti-inflammatories (NSAIDS) such as Advil, Aleve, Ibuprofen, Motrin, Naproxen, Naprosyn and Aspirin based products such as Excedrin, Goodys Powder, BC Powder-OK TO TAKE TYLENOL IF NEEDED  Stop ANY OVER THE COUNTER supplements until after surgery-STOP CO Q10 7 DAYS PRIOR TO SURGERY-YOU MAY RESUME AFTER SURGERY  No Alcohol for 24 hours before or after surgery.  No Smoking including e-cigarettes for 24 hours prior to surgery.  No chewable tobacco products for at least 6 hours prior to surgery.  No nicotine patches on the day of surgery.  Do not use any "recreational" drugs for at least a week prior to your  surgery.  Please be advised that the combination of cocaine and anesthesia may have negative outcomes, up to and including death. If you test positive for cocaine, your surgery will be cancelled.  On the morning of surgery brush your teeth with toothpaste and water, you may rinse your mouth with mouthwash if you wish. Do not swallow any toothpaste or mouthwash.  Do not wear jewelry, make-up, hairpins, clips or nail polish.  Do not wear lotions, powders, or perfumes.   Do not shave body from the neck down 48 hours prior to surgery just in case you cut yourself which could leave a site for infection.  Also, freshly shaved skin may become irritated if using the CHG soap.  Contact lenses, hearing aids and dentures may not be worn into surgery.  Do not bring valuables to the hospital. [facility] is not responsible for any missing/lost belongings or valuables.   Use CHG Soap as directed on instruction sheet.  Notify your doctor if there is any change in your medical condition (cold, fever, infection).  Wear comfortable clothing (specific to your surgery type) to the hospital.  Plan for stool softeners for home use; pain medications have a tendency to cause constipation. You can also help prevent constipation by eating foods high in fiber such as fruits and vegetables and drinking plenty of fluids as your diet allows.  After surgery, you can help prevent lung complications by doing breathing exercises.  Take deep breaths and cough every 1-2 hours. Your doctor may order a device called an Incentive Spirometer to help you take deep breaths.  When coughing or sneezing, hold a pillow firmly against your incision with both hands. This is called "splinting." Doing this helps protect your incision. It also decreases belly discomfort.  If you are being admitted to the hospital overnight, leave your suitcase in the car. After surgery it may be brought to your room.  If you are being discharged the  day of surgery, you will not be allowed to drive home. You will need a responsible adult (18 years or older) to drive you home and stay with you that night.   If you are taking public transportation, you will need to have a responsible adult (18 years or older) with you. Please confirm with your physician that it is acceptable to use public transportation.   Please call the [facility] Dept. at [phone] if you have any questions about these instructions.  Surgery Visitation Policy:  Patients undergoing a surgery or procedure may have one family member or support person with them as long as that person is not COVID-19 positive or experiencing its symptoms.  That person may remain in the waiting area during the procedure.  Inpatient Visitation:    Visiting hours are 7 a.m. to 8 p.m. Inpatients will be allowed two visitors daily. The visitors may change each day during the patient's stay. No visitors under the age of 29. Any visitor under the age of 65 must be accompanied by an adult. The visitor must pass COVID-19 screenings, use hand sanitizer when entering and exiting the patient's room and wear a mask at all times, including in the patient's room. Patients must also wear a mask when staff or their visitor are in the room. Masking is required regardless of vaccination status.

## 2020-10-20 ENCOUNTER — Ambulatory Visit (INDEPENDENT_AMBULATORY_CARE_PROVIDER_SITE_OTHER): Payer: Managed Care, Other (non HMO) | Admitting: Gastroenterology

## 2020-10-20 ENCOUNTER — Encounter: Payer: Self-pay | Admitting: Gastroenterology

## 2020-10-20 VITALS — BP 169/91 | HR 66 | Ht 67.0 in | Wt 223.4 lb

## 2020-10-20 DIAGNOSIS — K649 Unspecified hemorrhoids: Secondary | ICD-10-CM | POA: Diagnosis not present

## 2020-10-20 NOTE — Patient Instructions (Signed)
Hemorrhoids Hemorrhoids are swollen veins that may develop:  In the butt (rectum). These are called internal hemorrhoids.  Around the opening of the butt (anus). These are called external hemorrhoids. Hemorrhoids can cause pain, itching, or bleeding. Most of the time, they do not cause serious problems. They usually get better with diet changes, lifestyle changes, and other home treatments. What are the causes? This condition may be caused by:  Having trouble pooping (constipation).  Pushing hard (straining) to poop.  Watery poop (diarrhea).  Pregnancy.  Being very overweight (obese).  Sitting for long periods of time.  Heavy lifting or other activity that causes you to strain.  Anal sex.  Riding a bike for a long period of time. What are the signs or symptoms? Symptoms of this condition include:  Pain.  Itching or soreness in the butt.  Bleeding from the butt.  Leaking poop.  Swelling in the area.  One or more lumps around the opening of your butt. How is this diagnosed? A doctor can often diagnose this condition by looking at the affected area. The doctor may also:  Do an exam that involves feeling the area with a gloved hand (digital rectal exam).  Examine the area inside your butt using a small tube (anoscope).  Order blood tests. This may be done if you have lost a lot of blood.  Have you get a test that involves looking inside the colon using a flexible tube with a camera on the end (sigmoidoscopy or colonoscopy). How is this treated? This condition can usually be treated at home. Your doctor may tell you to change what you eat, make lifestyle changes, or try home treatments. If these do not help, procedures can be done to remove the hemorrhoids or make them smaller. These may involve:  Placing rubber bands at the base of the hemorrhoids to cut off their blood supply.  Injecting medicine into the hemorrhoids to shrink them.  Shining a type of light  energy onto the hemorrhoids to cause them to fall off.  Doing surgery to remove the hemorrhoids or cut off their blood supply. Follow these instructions at home: Eating and drinking  Eat foods that have a lot of fiber in them. These include whole grains, beans, nuts, fruits, and vegetables.  Ask your doctor about taking products that have added fiber (fibersupplements).  Reduce the amount of fat in your diet. You can do this by: ? Eating low-fat dairy products. ? Eating less red meat. ? Avoiding processed foods.  Drink enough fluid to keep your pee (urine) pale yellow.   Managing pain and swelling  Take a warm-water bath (sitz bath) for 20 minutes to ease pain. Do this 3-4 times a day. You may do this in a bathtub or using a portable sitz bath that fits over the toilet.  If told, put ice on the painful area. It may be helpful to use ice between your warm baths. ? Put ice in a plastic bag. ? Place a towel between your skin and the bag. ? Leave the ice on for 20 minutes, 2-3 times a day.   General instructions  Take over-the-counter and prescription medicines only as told by your doctor. ? Medicated creams and medicines may be used as told.  Exercise often. Ask your doctor how much and what kind of exercise is best for you.  Go to the bathroom when you have the urge to poop. Do not wait.  Avoid pushing too hard when you poop.  Keep your butt dry and clean. Use wet toilet paper or moist towelettes after pooping.  Do not sit on the toilet for a long time.  Keep all follow-up visits as told by your doctor. This is important. Contact a doctor if you:  Have pain and swelling that do not get better with treatment or medicine.  Have trouble pooping.  Cannot poop.  Have pain or swelling outside the area of the hemorrhoids. Get help right away if you have:  Bleeding that will not stop. Summary  Hemorrhoids are swollen veins in the butt or around the opening of the  butt.  They can cause pain, itching, or bleeding.  Eat foods that have a lot of fiber in them. These include whole grains, beans, nuts, fruits, and vegetables.  Take a warm-water bath (sitz bath) for 20 minutes to ease pain. Do this 3-4 times a day. This information is not intended to replace advice given to you by your health care provider. Make sure you discuss any questions you have with your health care provider. Document Revised: 07/12/2018 Document Reviewed: 11/23/2017 Elsevier Patient Education  [place].

## 2020-10-20 NOTE — Progress Notes (Signed)
Jonathon Bellows MD, MRCP(U.K) [address]  [place]  [place], [place] [address]  Main: [phone]  Fax: [phone]   Gastroenterology Consultation  Referring Provider:     Tonia Ghent, MD Primary Care Physician:  Tonia Ghent, MD Primary Gastroenterologist:  Dr. Jonathon Bellows  Reason for Consultation:     Hemorrhoids        HPI:   Stephen [name] is a 60 y.o. y/o male referred for consultation & management  by Dr. Damita Dunnings, Elveria Rising, MD.    The patient has been referred for evaluation of hemorrhoids.  He presented to the emergency room on 10/05/2020 painful external hemorrhoids.  He has been doing sitz bath stool softeners without any relief.  He underwent incision and drainage of the MRI under local anesthesia..  Subsequently referred and seen by Dr. Lysle Pearl on 10/12/2020 and noted to have persistent external hemorrhoids.  He has been scheduled for surgery and in the meanwhile has come to see me for a second opinion.  Originally from the [place], has had 2 episodes where he has had his hemorrhoids incised and drained.  In between he has been completely asymptomatic.  Denies any rectal bleeding, constipation, perianal itching.  Denies any issues at this point of time.  He feels completely comfortable presently  05/25/2020 hemoglobin 16.3 g.  Platelet count 259.  CMP normal. No prior colonoscopy.  No family history of colon cancer or polyps.   He states that since the drainage in the ER his perianal discomfort has significantly improved.  Denies any complaints at this point of time. Past Medical History:  Diagnosis Date  . Achilles tendon injury    right  . Arthritis    back  . Asthma    as a child  . CAD S/P percutaneous coronary angioplasty 05/25/2014  . ED (erectile dysfunction)   . Elevated glucose   . Erythrocytosis    SE[facility]  . GERD (gastroesophageal reflux disease)   . Gout   . Hyperlipidemia   . Hypertension   . Morton's neuroma    2014  .  Neuropathy   . Seasonal allergies    uses advair in May  . ST elevation myocardial infarction (STEMI) of anterior wall ([place]) 05/25/14   1 stent  . Tobacco abuse, + cigars 05/27/2014    Past Surgical History:  Procedure Laterality Date  . CORONARY ANGIOPLASTY WITH STENT PLACEMENT  05/25/14   Resolute integrity 3.5 X 18 mm DES 3.75 mm  . Fx L forearm closed fx  as a child  . Hospital / asthma     multiple times as child  . LEFT HEART CATHETERIZATION WITH CORONARY ANGIOGRAM N/A 05/25/2014   Procedure: LEFT HEART CATHETERIZATION WITH CORONARY ANGIOGRAM;  Surgeon: Sinclair Grooms, MD;  Location: [facility] CATH LAB;  Service: Cardiovascular;  Laterality: N/A;  . PERCUTANEOUS CORONARY STENT INTERVENTION (PCI-S)  05/25/2014   Procedure: PERCUTANEOUS CORONARY STENT INTERVENTION (PCI-S);  Surgeon: Sinclair Grooms, MD;  Location: [facility] CATH LAB;  Service: Cardiovascular;;  PROX LAD     Prior to Admission medications   Medication Sig Start Date End Date Taking? Authorizing Provider  amLODipine (NORVASC) 5 MG tablet TAKE 1 TABLET BY MOUTH  DAILY Patient taking differently: Take 5 mg by mouth at bedtime. 02/03/20   Burtis Junes, NP  aspirin 81 MG chewable tablet Chew 1 tablet (81 mg total) by mouth daily. 05/27/14   Isaiah Serge, NP  carvedilol (COREG) 3.125 MG tablet TAKE 1 TABLET BY MOUTH  TWICE DAILY Patient not taking: Reported on 10/19/2020 06/10/20   Belva Crome, MD  carvedilol (COREG) 6.25 MG tablet Take 1 tablet (6.25 mg total) by mouth 2 (two) times daily. 05/18/20   Belva Crome, MD  Coenzyme Q10 (CO Q 10) 100 MG CAPS Take 100 mg by mouth daily.    [provider]  colchicine 0.6 MG tablet Take 1 tablet (0.6 mg total) by mouth daily as needed. Patient taking differently: Take 0.6 mg by mouth daily as needed (Gout). 06/16/20   Tonia Ghent, MD  Fluticasone-Salmeterol (ADVAIR) 100-50 MCG/DOSE AEPB Inhale 1 puff into the lungs daily as needed (allergies).    [provider]  Ketotifen Fumarate (ALAWAY OP) Place 1 drop into both eyes daily as needed (seasonal allergies).    [provider]  Lidocaine 3 % CREA Apply 1 application topically 2 (two) times daily.    [provider]  nitroGLYCERIN (NITROSTAT) 0.4 MG SL tablet Place 1 tablet (0.4 mg total) under the tongue every 5 (five) minutes as needed for chest pain. 05/18/20   Belva Crome, MD  oxyCODONE-acetaminophen (PERCOCET) 5-325 MG tablet Take 1 tablet by mouth every 6 (six) hours as needed for severe pain. Patient not taking: Reported on 10/19/2020 10/05/20 10/05/21  Letitia Neri L, PA-C  rosuvastatin (CRESTOR) 20 MG tablet TAKE 1 TABLET BY MOUTH  DAILY Patient taking differently: Take 20 mg by mouth at bedtime. 06/10/20   Belva Crome, MD  sildenafil (REVATIO) 20 MG tablet Take 20 mg by mouth See admin instructions. Take 20 mg to 100 mg by mouth daily as needed for erectile dysfunction.    [provider]  testosterone cypionate (DEPOTESTOSTERONE CYPIONATE) 200 MG/ML injection Inject 0.75 mLs (150 mg total) into the muscle every 14 (fourteen) days. 04/09/18   Tonia Ghent, MD  valsartan-hydrochlorothiazide (DIOVAN-HCT) 320-12.5 MG tablet TAKE 1 TABLET BY MOUTH  DAILY Patient taking differently: Take 1 tablet by mouth every morning. 06/10/20   Belva Crome, MD    Family History  Problem Relation Age of Onset  . Stroke Mother   . Hypertension Father   . Dementia Father   . Stroke Father 82       Multiple  . AAA (abdominal aortic aneurysm) Father   . Hyperlipidemia Sister   . Hypertension Sister   . Alcohol abuse Brother        organ failure  . Prostate cancer Neg Hx   . Colon cancer Neg Hx      Social History   Tobacco Use  . Smoking status: Former Smoker    Types: Cigars    Quit date: 05/25/2014    Years since quitting: 6.4  . Smokeless tobacco: Never Used  Substance Use Topics  . Alcohol use: Yes    Alcohol/week: 2.0 standard drinks    Types: 2 Cans of  beer per week    Comment: daily  . Drug use: No    Allergies as of 10/20/2020 - Review Complete 10/19/2020  Allergen Reaction Noted  . Claritin-d 12 hour [loratadine-pseudoephedrine er] Other (See Comments) 08/26/2013  . Kiwi extract Other (See Comments) 05/25/2014  . Penicillins Other (See Comments) 03/27/2007  . Simvastatin Other (See Comments) 10/31/2013    Review of Systems:    All systems reviewed and negative except where noted in HPI.   Physical Exam:  There were no vitals taken for this visit. No  LMP for male patient. Psych:  Alert and cooperative. Normal mood and affect. General:   Alert,  Well-developed, well-nourished, pleasant and cooperative in NAD Head:  Normocephalic and atraumatic. Eyes:  Sclera clear, no icterus.   Conjunctiva pink. Neurologic:  Alert and oriented x3;  grossly normal neurologically. Skin:  Intact without significant lesions or rashes. No jaundice. Lymph Nodes:  No significant cervical adenopathy. Psych:  Alert and cooperative. Normal mood and affect. Rectal exam on anoscopic exam with chaperone in the room: Externally at the 9 o'clock position there is a thrombosed hemorrhoid with swelling but no tenderness.  He states that it feels much better than what it was previously.  With introduction of the finger no other masses are felt internally, no pain or tenderness, no blood on the glove.  An anoscopy was performed and 3 columns of hemorrhoids are noted in the right anterior and left lateral with a large right posterior hemorrhoid which appears to be the one which has been thrombosed.  I do not see any external hemorrhoids.  These internal hemorrhoids grade 2 hemorrhoids. Imaging Studies: No results found.  Assessment and Plan:   Stephen [name] is a 64 y.o. y/o male has been referred for evaluation of hemorrhoids.  Recent ER visit and had a possible strangulated hemorrhoid incised and drained.   He has been seen by surgery and has outpatient  surgery scheduled on the 14th and is here for a second opinion.  My impression is that he does have prolapsing second-degree internal hemorrhoids.  It is possible that the incised and drained hemorrhoid was 3rd  degree strangulated hemorrhoid.  At this point of time he states that his hemorrhoids feels much better he has no pain or discomfort.  I explained to him that it is very possible that I will be able to band them at the office once the edema and swelling  have resolved from the hemorroid that was incised .  I do not see any large external hemorrhoids as such.  Plan 1.  Colonoscopy: I strongly suggested to him that he should undergo a colonoscopy as we would like to rule out a more rectal lesions which can mimic these symptoms.  He says that he will think about it.  2.  Continue conservative management of hemorrhoids which I discussed with him.  I will him back in 8 to 10 weeks once all the swelling and edema has decreased, I will repeat anoscopy and if I see only second degree hemorrhoids , then I will proceed with in office banding .   Follow up in 10 weeks office visit  Dr Jonathon Bellows MD,MRCP(U.K)

## 2020-10-23 ENCOUNTER — Inpatient Hospital Stay: Admission: RE | Admit: 2020-10-23 | Payer: Managed Care, Other (non HMO) | Source: Ambulatory Visit

## 2020-10-27 ENCOUNTER — Other Ambulatory Visit: Admission: RE | Admit: 2020-10-27 | Payer: Managed Care, Other (non HMO) | Source: Ambulatory Visit

## 2020-10-27 ENCOUNTER — Other Ambulatory Visit: Payer: Managed Care, Other (non HMO)

## 2020-10-29 ENCOUNTER — Encounter: Admission: RE | Payer: Self-pay | Source: Home / Self Care

## 2020-10-29 ENCOUNTER — Ambulatory Visit: Admission: RE | Admit: 2020-10-29 | Payer: Managed Care, Other (non HMO) | Source: Home / Self Care | Admitting: Surgery

## 2020-10-29 ENCOUNTER — Ambulatory Visit: Admit: 2020-10-29 | Payer: Managed Care, Other (non HMO) | Admitting: Surgery

## 2020-10-29 SURGERY — HEMORRHOIDECTOMY
Anesthesia: General | Site: Anus

## 2020-10-29 SURGERY — EXAM UNDER ANESTHESIA WITH HEMORRHOIDECTOMY
Anesthesia: General | Site: Rectum

## 2020-11-05 DIAGNOSIS — M79671 Pain in right foot: Secondary | ICD-10-CM | POA: Insufficient documentation

## 2020-11-25 ENCOUNTER — Encounter: Payer: Self-pay | Admitting: Oncology

## 2020-11-30 ENCOUNTER — Other Ambulatory Visit: Payer: Self-pay

## 2020-11-30 MED ORDER — AMLODIPINE BESYLATE 5 MG PO TABS: 1.0000 | ORAL_TABLET | Freq: Every day | ORAL | 1 refills | Status: DC

## 2020-12-02 ENCOUNTER — Inpatient Hospital Stay: Payer: Managed Care, Other (non HMO)

## 2020-12-02 ENCOUNTER — Other Ambulatory Visit: Payer: Self-pay

## 2020-12-02 ENCOUNTER — Encounter: Payer: Self-pay | Admitting: Oncology

## 2020-12-02 ENCOUNTER — Inpatient Hospital Stay: Payer: Managed Care, Other (non HMO) | Attending: Oncology | Admitting: Oncology

## 2020-12-02 VITALS — BP 164/90 | HR 65 | Resp 16

## 2020-12-02 VITALS — BP 165/92 | HR 64 | Temp 98.1°F | Resp 16 | Wt 221.7 lb

## 2020-12-02 DIAGNOSIS — D751 Secondary polycythemia: Secondary | ICD-10-CM

## 2020-12-02 DIAGNOSIS — G629 Polyneuropathy, unspecified: Secondary | ICD-10-CM | POA: Diagnosis not present

## 2020-12-02 DIAGNOSIS — E291 Testicular hypofunction: Secondary | ICD-10-CM | POA: Insufficient documentation

## 2020-12-02 NOTE — Progress Notes (Signed)
HCT 49.9 per [facility] results. Per Dr Tasia Catchings proceed with 500 ml phlebotomy. Patient tolerated procedure. Accepted a beverage. Discharged to home. Feeling at his baseline with no symptoms.

## 2020-12-02 NOTE — Progress Notes (Signed)
Patient denies new problems/concerns today.   °

## 2020-12-02 NOTE — Progress Notes (Signed)
Hematology/Oncology note [facility] Telephone:([phone] [phone]   Patient Care Team: Tonia Ghent, MD as PCP - General (Family Medicine) Belva Crome, MD as PCP - Cardiology (Cardiology) Pa, [facility] (Urology) Festus Aloe, MD as Consulting Physician (Urology)  REFERRING PROVIDER: Tonia Ghent, MD  CHIEF COMPLAINTS/REASON FOR VISIT:  Follow-up for polycytosis  HISTORY OF PRESENTING ILLNESS:  Stephen [name] is a 60 y.o. male who was seen in consultation at the request of Damita Dunnings, Elveria Rising, MD for evaluation of polycytosis/erythrocytosis Patient does not have recent blood work in current EMR. Reviewed previous blood work that was scanned into EMR. 10/15/2018 hemoglobin 17.4, hematocrit 51.7 12/12/2018 hemoglobin 17.8, hematocrit 50.5. 01/04/2019 hemoglobin 17.2, hematocrit 48  Patient has been well testosterone supplementation for treatment of primary hypogonadism since 2015.  [facility].  Currently on testosterone 150 mg injection every 2 weeks Reports previous history of myocardial infarction, in 2060  Associated signs or symptoms: Denies weight loss, fever, chills, fatigue, night sweats.   Context:  Smoking history: Denies.  Former smoker. Testosterone supplements: See above. History of blood clots: Denies Daytime somnolence: Denies Family history of polycythemia: Father with erythrocytosis and need phlebotomy.  Per patient, father probably also had secondary erythrocytosis.  Details not clear.  Patient has had phlebotomy previously, obtained by urologist.  Reports that recently he had had a phlebotomy a few weeks ago.  1 week after phlebotomy, his hemoglobin was around 17.  I do not have his most recent blood work.  INTERVAL HISTORY Stephen [name] is a 60 y.o. male who has above history reviewed by me today presents for follow up visit for management of erythrocytosis Problems  and complaints are listed below: On testosterone.  Patient has establish care with a neurologist for evaluation of lower extremity numbness.  Work-up indicates polyneuropathy.  He is taking gabapentin. Complaints.   Review of Systems  Constitutional: Negative for appetite change, chills, fatigue, fever and unexpected weight change.  HENT:   Negative for hearing loss and voice change.   Eyes: Negative for eye problems and icterus.  Respiratory: Negative for chest tightness, cough and shortness of breath.   Cardiovascular: Negative for chest pain and leg swelling.  Gastrointestinal: Negative for abdominal distention and abdominal pain.  Endocrine: Negative for hot flashes.  Genitourinary: Negative for difficulty urinating, dysuria and frequency.   Musculoskeletal: Negative for arthralgias.  Skin: Negative for itching and rash.  Neurological: Positive for numbness. Negative for light-headedness.  Hematological: Negative for adenopathy. Does not bruise/bleed easily.  Psychiatric/Behavioral: Negative for confusion.    MEDICAL HISTORY:  Past Medical History:  Diagnosis Date  . Achilles tendon injury    right  . Arthritis    back  . Asthma    as a child  . CAD S/P percutaneous coronary angioplasty 05/25/2014  . ED (erectile dysfunction)   . Elevated glucose   . Erythrocytosis    [facility] Zakry Caso [facility] [place] [facility]  . GERD (gastroesophageal reflux disease)   . Gout   . Hyperlipidemia   . Hypertension   . Morton's neuroma    2014  . Neuropathy   . Seasonal allergies    uses advair in May  . ST elevation myocardial infarction (STEMI) of anterior wall ([place]) 05/25/14   1 stent  . Tobacco abuse, + cigars 05/27/2014    SURGICAL HISTORY: Past Surgical History:  Procedure Laterality Date  . CORONARY ANGIOPLASTY WITH STENT PLACEMENT  05/25/14   Resolute integrity  3.5 X 18 mm DES 3.75 mm  . Fx L forearm closed fx  as a child  . Hospital / asthma     multiple times as child  . LEFT HEART  CATHETERIZATION WITH CORONARY ANGIOGRAM N/A 05/25/2014   Procedure: LEFT HEART CATHETERIZATION WITH CORONARY ANGIOGRAM;  Surgeon: Sinclair Grooms, MD;  Location: [facility] CATH LAB;  Service: Cardiovascular;  Laterality: N/A;  . PERCUTANEOUS CORONARY STENT INTERVENTION (PCI-S)  05/25/2014   Procedure: PERCUTANEOUS CORONARY STENT INTERVENTION (PCI-S);  Surgeon: Sinclair Grooms, MD;  Location: [facility] CATH LAB;  Service: Cardiovascular;;  PROX LAD     SOCIAL HISTORY: Social History   Socioeconomic History  . Marital status: Married    Spouse name: Not on file  . Number of children: 1  . Years of education: Not on file  . Highest education level: Not on file  Occupational History  . Occupation: [employer]    CommentSecretary/administrator of special chemistries  Tobacco Use  . Smoking status: Former Smoker    Types: Cigars    Quit date: 05/25/2014    Years since quitting: 6.5  . Smokeless tobacco: Never Used  Substance and Sexual Activity  . Alcohol use: Yes    Alcohol/week: 2.0 standard drinks    Types: 2 Cans of beer per week    Comment: daily  . Drug use: No  . Sexual activity: Yes  Other Topics Concern  . Not on file  Social History Narrative   One son, 1 stepson   Married 2001   From [place]   Works for [employer].    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    Social Determinants of Health   Financial Resource Strain: Not on file  Food Insecurity: Not on file  Transportation Needs: Not on file  Physical Activity: Not on file  Stress: Not on file  Social Connections: Not on file  Intimate Partner Violence: Not on file    FAMILY HISTORY: Family History  Problem Relation Age of Onset  . Stroke Mother   . Hypertension Father   . Dementia Father   . Stroke Father 70       Multiple  . AAA (abdominal aortic aneurysm) Father   . Hyperlipidemia Sister   . Hypertension Sister   . Alcohol abuse Brother        organ failure  . Prostate cancer Neg Hx   . Colon cancer  Neg Hx     ALLERGIES:  is allergic to loratadine, claritin-d 12 hour [loratadine-pseudoephedrine er], kiwi extract, penicillins, and simvastatin.  MEDICATIONS:  Current Outpatient Medications  Medication Sig Dispense Refill  . amLODipine (NORVASC) 5 MG tablet Take 1 tablet (5 mg total) by mouth daily. 90 tablet 1  . aspirin 81 MG chewable tablet Chew 1 tablet (81 mg total) by mouth daily.    . carvedilol (COREG) 6.25 MG tablet Take 1 tablet (6.25 mg total) by mouth 2 (two) times daily. 180 tablet 3  . Coenzyme Q10 (CO Q 10) 100 MG CAPS Take 100 mg by mouth daily.    . colchicine 0.6 MG tablet Take 1 tablet (0.6 mg total) by mouth daily as needed. (Patient taking differently: Take 0.6 mg by mouth daily as needed (Gout).) 90 tablet 0  . Fluticasone-Salmeterol (ADVAIR) 100-50 MCG/DOSE AEPB Inhale 1 puff into the lungs daily as needed (allergies).    . gabapentin (NEURONTIN) 100 MG capsule Take 100 mg twice daily for one week, then  increase to 200 mg twice daily    . Ketotifen Fumarate (ALAWAY OP) Place 1 drop into both eyes daily as needed (seasonal allergies).    . nitroGLYCERIN (NITROSTAT) 0.4 MG SL tablet Place 1 tablet (0.4 mg total) under the tongue every 5 (five) minutes as needed for chest pain. 25 tablet 3  . rosuvastatin (CRESTOR) 20 MG tablet TAKE 1 TABLET BY MOUTH  DAILY (Patient taking differently: Take 20 mg by mouth at bedtime.) 90 tablet 3  . sildenafil (REVATIO) 20 MG tablet Take 20 mg by mouth See admin instructions. Take 20 mg to 100 mg by mouth daily as needed for erectile dysfunction.    Marland Kitchen testosterone cypionate (DEPOTESTOSTERONE CYPIONATE) 200 MG/ML injection Inject 0.75 mLs (150 mg total) into the muscle every 14 (fourteen) days.    . valsartan-hydrochlorothiazide (DIOVAN-HCT) 320-12.5 MG tablet TAKE 1 TABLET BY MOUTH  DAILY (Patient taking differently: Take 1 tablet by mouth every morning.) 90 tablet 3  . carvedilol (COREG) 3.125 MG tablet TAKE 1 TABLET BY MOUTH  TWICE DAILY  (Patient not taking: Reported on 10/19/2020) 180 tablet 3  . Lidocaine 3 % CREA Apply 1 application topically 2 (two) times daily.    Marland Kitchen oxyCODONE-acetaminophen (PERCOCET) 5-325 MG tablet Take 1 tablet by mouth every 6 (six) hours as needed for severe pain. (Patient not taking: No sig reported) 12 tablet 0   No current facility-administered medications for this visit.     PHYSICAL EXAMINATION: ECOG PERFORMANCE STATUS: 0 - Asymptomatic Vitals:   12/02/20 1317  BP: (!) 165/92  Pulse: 64  Resp: 16  Temp: 98.1 F (36.7 C)   Filed Weights   12/02/20 1317  Weight: 221 lb 11.2 oz (100.6 kg)    Physical Exam Constitutional:      General: He is not in acute distress. HENT:     Head: Normocephalic and atraumatic.  Eyes:     General: No scleral icterus.    Pupils: Pupils are equal, round, and reactive to light.  Cardiovascular:     Rate and Rhythm: Normal rate and regular rhythm.     Heart sounds: Normal heart sounds.  Pulmonary:     Effort: Pulmonary effort is normal. No respiratory distress.     Breath sounds: No wheezing.  Abdominal:     General: Bowel sounds are normal. There is no distension.     Palpations: Abdomen is soft. There is no mass.     Tenderness: There is no abdominal tenderness.  Musculoskeletal:        General: No deformity. Normal range of motion.     Cervical back: Normal range of motion and neck supple.  Skin:    General: Skin is warm and dry.     Findings: No erythema or rash.  Neurological:     Mental Status: He is alert and oriented to person, place, and time. Mental status is at baseline.     Cranial Nerves: No cranial nerve deficit.     Coordination: Coordination normal.  Psychiatric:        Mood and Affect: Mood normal.        Behavior: Behavior normal.        Thought Content: Thought content normal.     RADIOGRAPHIC STUDIES: I have personally reviewed the radiological images as listed and agreed with the findings in the report. No results  found.   LABORATORY DATA:  I have reviewed the data as listed Lab Results  Component Value Date   WBC 7.0 05/25/2020  HGB 16.3 05/25/2020   HCT 49.2 05/25/2020   MCV 90 05/25/2020   PLT 259 05/25/2020   Recent Labs    05/25/20 0738  NA 136  K 4.5  CL 99  CO2 27  GLUCOSE 102*  BUN 15  CREATININE 1.11  CALCIUM 9.1  GFRNONAA 73  GFRAA 84  PROT 6.9  ALBUMIN 4.3  AST 15  ALT 25  ALKPHOS 86  BILITOT 0.3   Iron/TIBC/Ferritin/ %Sat No results found for: IRON, TIBC, FERRITIN, IRONPCTSAT   [facility] results 06/02/2020 Hb 17.2, Hct 50.7 08/25/2020 hemoglobin 17.1, hematocrit 52.2. 11/24/20 hemoglobin 16.9, hematocrit 49.9  ASSESSMENT & PLAN:  1. Secondary erythrocytosis   2. 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 Labs reviewed and discussed with patient. Hct is 50.  Given that he is on testosterone treatments.  Proceed with phlebotomy today.   #Hypogonadism in male, patient is on testosterone treatments.  Continue follow-up with urology. #Polyneuropathy.  Follow-up with neurologist.  SPEP is negative for M protein.  Will provide patient information of acupuncture. Follow-up in 3 months. We spent sufficient time to discuss many aspect of care, questions were answered to patient's satisfaction. The patient knows to call the clinic with any problems questions or concerns.  Earlie Server, MD, PhD 12/02/2020

## 2020-12-04 ENCOUNTER — Other Ambulatory Visit: Payer: Self-pay

## 2020-12-04 MED ORDER — CARVEDILOL 6.25 MG PO TABS: 6.2500 mg | ORAL_TABLET | Freq: Two times a day (BID) | ORAL | 1 refills | Status: DC

## 2020-12-17 ENCOUNTER — Encounter: Payer: Self-pay | Admitting: Family Medicine

## 2020-12-21 ENCOUNTER — Other Ambulatory Visit: Payer: Self-pay | Admitting: *Deleted

## 2020-12-21 MED ORDER — EZETIMIBE 10 MG PO TABS: 10.0000 mg | ORAL_TABLET | Freq: Every day | ORAL | 3 refills | Status: DC

## 2020-12-25 ENCOUNTER — Other Ambulatory Visit: Payer: Self-pay

## 2020-12-28 ENCOUNTER — Ambulatory Visit: Payer: Managed Care, Other (non HMO) | Admitting: Gastroenterology

## 2020-12-28 ENCOUNTER — Encounter: Payer: Self-pay | Admitting: Gastroenterology

## 2020-12-28 ENCOUNTER — Other Ambulatory Visit: Payer: Self-pay

## 2020-12-28 VITALS — BP 157/82 | HR 69 | Temp 98.0°F | Ht 67.0 in | Wt 218.0 lb

## 2020-12-28 DIAGNOSIS — Z1211 Encounter for screening for malignant neoplasm of colon: Secondary | ICD-10-CM

## 2020-12-28 DIAGNOSIS — K649 Unspecified hemorrhoids: Secondary | ICD-10-CM

## 2020-12-28 NOTE — Patient Instructions (Signed)
Please give [place] a call in 2 months to schedule you a screening colonoscopy.  Please give [place] a call in case your hemorrhoids need banding.

## 2020-12-28 NOTE — Progress Notes (Signed)
Patient follow-ups today for banding of hemorrhoids    Summary of history :  The patient has been referred for evaluation of hemorrhoids.  He presented to the emergency room on 10/05/2020 painful external hemorrhoids.  He has been doing sitz bath stool softeners without any relief.  He underwent incision and drainage of the MRI under local anesthesia..  Subsequently referred and seen by Dr. Lysle Pearl on 10/12/2020 and noted to have persistent external hemorrhoids.  He has been scheduled for surgery and in the meanwhile has come to see me for a second opinion.  Originally from the [place], has had 2 episodes where he has had his hemorrhoids incised and drained.  In between he has been completely asymptomatic.  Denies any rectal bleeding, constipation, perianal itching.  Denies any issues at this point of time.  He feels completely comfortable presently   05/25/2020 hemoglobin 16.3 g.  Platelet count 259.  CMP normal. No prior colonoscopy.  No family history of colon cancer or polyps.   Since initial visit on his rectal exam I noted at 9 o'clock position with a thrombosed hemorrhoid with swelling but no tenderness.  Anoscopy also performed and 3 columns of hemorrhoids noted in the right anterior, left lateral and large right posterior.  After his last visit he changed to high-fiber diet and started taking stool softener daily and since then he has had no issues at all with any perianal symptoms.   Digital rectal exam performed in the presence of a chaperone. External anal findings: No abnormalities seen Internal findings: , No masses, no blood on glove noticed. Anoscopy was performed: Very small hemorrhoids noted in all 3 positons   Impression: Symptomatic grade 2 hemorrhoids with history of thrombosis.  Here today to discuss about banding.  He was compliant with lifestyle changes and since then has had significant improvement of his symptoms with no symptoms at all at this point of time.  On digital exam as  well as anoscopic exam I could not the significantly large prolapsing hemorrhoids.  We decided that at this point of time best to watch and wait.  Obviously if he has further symptoms I can bring him in to the office to pursue banding.  I explained to him that conservative management alone can provide relief and up to 50% of patients with symptomatic hemorrhoids.   Plan:  Continue conservative management Discussed screening colonoscopy average risk to schedule.  He said he had further appointments that he was waiting to complete at this point of time and will call our office after that to schedule.  I will have my office call him in 2 months as a reminder to schedule.  Follow-up: As needed  Dr Jonathon Bellows MD,MRCP [facility]) Gastroenterology/Hepatology Pager: [phone]

## 2021-02-25 ENCOUNTER — Encounter: Payer: Self-pay | Admitting: Oncology

## 2021-03-01 MED ORDER — CARVEDILOL 12.5 MG PO TABS: 12.5000 mg | ORAL_TABLET | Freq: Two times a day (BID) | ORAL | 3 refills | Status: DC

## 2021-03-04 ENCOUNTER — Inpatient Hospital Stay: Payer: Managed Care, Other (non HMO) | Attending: Oncology | Admitting: Oncology

## 2021-03-04 ENCOUNTER — Encounter: Payer: Self-pay | Admitting: Oncology

## 2021-03-04 DIAGNOSIS — Z79890 Hormone replacement therapy (postmenopausal): Secondary | ICD-10-CM

## 2021-03-04 DIAGNOSIS — D751 Secondary polycythemia: Secondary | ICD-10-CM

## 2021-03-04 DIAGNOSIS — I252 Old myocardial infarction: Secondary | ICD-10-CM | POA: Diagnosis not present

## 2021-03-04 NOTE — Progress Notes (Signed)
HEMATOLOGY-ONCOLOGY TeleHEALTH VISIT PROGRESS NOTE  I connected with Stephen [name] on 03/04/21  at  2:50 PM EDT by video enabled telemedicine visit and verified that I am speaking with the correct person using two identifiers. I discussed the limitations, risks, security and privacy concerns of performing an evaluation and management service by telemedicine and the availability of in-person appointments. The patient expressed understanding and agreed to proceed.   Other persons participating in the visit and their role in the encounter:  None  Patient's location: Home  Provider's location: office Chief Complaint: Erythrocytosis   INTERVAL HISTORY Stephen [name] is a 60 y.o. male who has above history reviewed by me today presents for follow up visit for erythrocytosis Problems and complaints are listed below:  Patient is on testosterone replacement therapy. No new concerns.   Review of Systems  Constitutional:  Negative for appetite change, chills, fatigue, fever and unexpected weight change.  HENT:   Negative for hearing loss and voice change.   Eyes:  Negative for eye problems and icterus.  Respiratory:  Negative for chest tightness, cough and shortness of breath.   Cardiovascular:  Negative for chest pain and leg swelling.  Gastrointestinal:  Negative for abdominal distention and abdominal pain.  Endocrine: Negative for hot flashes.  Genitourinary:  Negative for difficulty urinating, dysuria and frequency.   Musculoskeletal:  Negative for arthralgias.  Skin:  Negative for itching and rash.  Neurological:  Negative for light-headedness and numbness.  Hematological:  Negative for adenopathy. Does not bruise/bleed easily.  Psychiatric/Behavioral:  Negative for confusion.    Past Medical History:  Diagnosis Date   Achilles tendon injury    right   Arthritis    back   Asthma    as a child   CAD S/P percutaneous coronary angioplasty 05/25/2014   ED (erectile dysfunction)     Elevated glucose    Erythrocytosis    [facility] [facility] Gianluca Chhim [facility] [place] [facility]   GERD (gastroesophageal reflux disease)    Gout    Hyperlipidemia    Hypertension    Morton's neuroma    2014   Neuropathy    Seasonal allergies    uses advair in May   ST elevation myocardial infarction (STEMI) of anterior wall ([place]) 05/25/14   1 stent   Tobacco abuse, + cigars 05/27/2014   Past Surgical History:  Procedure Laterality Date   CORONARY ANGIOPLASTY WITH STENT PLACEMENT  05/25/14   Resolute integrity 3.5 X 18 mm DES 3.75 mm   Fx L forearm closed fx  as a child   Hospital / asthma     multiple times as child   LEFT HEART CATHETERIZATION WITH CORONARY ANGIOGRAM N/A 05/25/2014   Procedure: LEFT HEART CATHETERIZATION WITH CORONARY ANGIOGRAM;  Surgeon: Sinclair Grooms, MD;  Location: [facility] CATH LAB;  Service: Cardiovascular;  Laterality: N/A;   PERCUTANEOUS CORONARY STENT INTERVENTION (PCI-S)  05/25/2014   Procedure: PERCUTANEOUS CORONARY STENT INTERVENTION (PCI-S);  Surgeon: Sinclair Grooms, MD;  Location: [facility] CATH LAB;  Service: Cardiovascular;;  PROX LAD     Family History  Problem Relation Age of Onset   Stroke Mother    Hypertension Father    Dementia Father    Stroke Father 52       Multiple   AAA (abdominal aortic aneurysm) Father    Hyperlipidemia Sister    Hypertension Sister    Alcohol abuse Brother        organ failure   Prostate cancer Neg Hx  Colon cancer Neg Hx     Social History   Socioeconomic History   Marital status: Married    Spouse name: Not on file   Number of children: 1   Years of education: Not on file   Highest education level: Not on file  Occupational History   Occupation: [employer]    Comment: supervisor of special chemistries  Tobacco Use   Smoking status: Former    Types: Cigars    Quit date: 05/25/2014    Years since quitting: 6.7   Smokeless tobacco: Never  Substance and Sexual Activity   Alcohol use: Yes    Alcohol/week: 2.0 standard drinks    Types:  2 Cans of beer per week    Comment: daily   Drug use: No   Sexual activity: Yes  Other Topics Concern   Not on file  Social History Narrative   One son, 1 stepson   Married 2001   From [place]   Works for [employer].    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    Social Determinants of Health   Financial Resource Strain: Not on file  Food Insecurity: Not on file  Transportation Needs: Not on file  Physical Activity: Not on file  Stress: Not on file  Social Connections: Not on file  Intimate Partner Violence: Not on file    Current Outpatient Medications on File Prior to Visit  Medication Sig Dispense Refill   amLODipine (NORVASC) 5 MG tablet Take 1 tablet (5 mg total) by mouth daily. 90 tablet 1   aspirin 81 MG chewable tablet Chew 1 tablet (81 mg total) by mouth daily.     carvedilol (COREG) 12.5 MG tablet Take 1 tablet (12.5 mg total) by mouth 2 (two) times daily. 180 tablet 3   Coenzyme Q10 (CO Q 10) 100 MG CAPS Take 100 mg by mouth daily.     colchicine 0.6 MG tablet Take 1 tablet (0.6 mg total) by mouth daily as needed. (Patient taking differently: Take 0.6 mg by mouth daily as needed (Gout).) 90 tablet 0   ezetimibe (ZETIA) 10 MG tablet Take 1 tablet (10 mg total) by mouth daily. 90 tablet 3   Fluticasone-Salmeterol (ADVAIR) 100-50 MCG/DOSE AEPB Inhale 1 puff into the lungs daily as needed (allergies).     gabapentin (NEURONTIN) 100 MG capsule Take 100 mg twice daily for one week, then increase to 200 mg twice daily     gabapentin (NEURONTIN) 100 MG capsule Take by mouth.     gabapentin (NEURONTIN) 300 MG capsule Take 300 mg twice a day for 2 weeks, then increase to 300 mg three time a day.     Ketotifen Fumarate (ALAWAY OP) Place 1 drop into both eyes daily as needed (seasonal allergies).     Lidocaine 3 % CREA Apply 1 application topically 2 (two) times daily.     testosterone cypionate (DEPOTESTOSTERONE CYPIONATE) 200 MG/ML injection Inject 0.75 mLs  (150 mg total) into the muscle every 14 (fourteen) days.     valsartan-hydrochlorothiazide (DIOVAN-HCT) 320-12.5 MG tablet TAKE 1 TABLET BY MOUTH  DAILY (Patient taking differently: Take 1 tablet by mouth every morning.) 90 tablet 3   nitroGLYCERIN (NITROSTAT) 0.4 MG SL tablet Place 1 tablet (0.4 mg total) under the tongue every 5 (five) minutes as needed for chest pain. (Patient not taking: Reported on 03/04/2021) 25 tablet 3   oxyCODONE-acetaminophen (PERCOCET) 5-325 MG tablet Take 1 tablet by mouth every 6 (six) hours  as needed for severe pain. (Patient not taking: Reported on 03/04/2021) 12 tablet 0   rosuvastatin (CRESTOR) 20 MG tablet Take 20 mg by mouth at bedtime. (Patient not taking: Reported on 03/04/2021)     sildenafil (REVATIO) 20 MG tablet Take 20 mg by mouth See admin instructions. Take 20 mg to 100 mg by mouth daily as needed for erectile dysfunction. (Patient not taking: Reported on 03/04/2021)     No current facility-administered medications on file prior to visit.    Allergies  Allergen Reactions   Loratadine Anxiety   Claritin-D 12 Hour [Loratadine-Pseudoephedrine Er] Other (See Comments)    shaky   Kiwi Extract Other (See Comments)    Throat irritation, swelling   Penicillins Other (See Comments)    Unknown childhood allergic reaction   Simvastatin Other (See Comments)    Numbness in toes, raised A1C, affected memory, fatigue       Observations/Objective: Today's Vitals   03/04/21 1354  PainSc: 0-No pain   There is no height or weight on file to calculate BMI.  Physical Exam Neurological:     Mental Status: He is alert.    CBC    Component Value Date/Time   WBC 7.0 05/25/2020 0738   WBC 10.7 (H) 05/26/2014 0536   RBC 5.47 05/25/2020 0738   RBC 5.46 05/26/2014 0536   HGB 16.3 05/25/2020 0738   HCT 49.2 05/25/2020 0738   PLT 259 05/25/2020 0738   MCV 90 05/25/2020 0738   MCV 96 05/25/2014 0940   MCH 29.8 05/25/2020 0738   MCH 32.2 05/26/2014 0536   MCHC  33.1 05/25/2020 0738   MCHC 34.9 05/26/2014 0536   RDW 13.3 05/25/2020 0738   RDW 13.3 05/25/2014 0940   LYMPHSABS 1.6 05/25/2020 0738   EOSABS 0.5 (H) 05/25/2020 0738   BASOSABS 0.0 05/25/2020 0738    CMP     Component Value Date/Time   NA 136 05/25/2020 0738   NA 141 05/25/2014 0940   K 4.5 05/25/2020 0738   K 3.7 05/25/2014 0940   CL 99 05/25/2020 0738   CL 104 05/25/2014 0940   CO2 27 05/25/2020 0738   CO2 30 05/25/2014 0940   GLUCOSE 102 (H) 05/25/2020 0738   GLUCOSE 95 12/02/2014 0728   GLUCOSE 93 05/25/2014 0940   BUN 15 05/25/2020 0738   BUN 13 05/25/2014 0940   CREATININE 1.11 05/25/2020 0738   CREATININE 1.19 05/25/2014 0940   CALCIUM 9.1 05/25/2020 0738   CALCIUM 8.4 (L) 05/25/2014 0940   PROT 6.9 05/25/2020 0738   ALBUMIN 4.3 05/25/2020 0738   AST 15 05/25/2020 0738   ALT 25 05/25/2020 0738   ALKPHOS 86 05/25/2020 0738   BILITOT 0.3 05/25/2020 0738   GFRNONAA 73 05/25/2020 0738   GFRNONAA >60 05/25/2014 0940   GFRAA 84 05/25/2020 0738   GFRAA >60 05/25/2014 0940     Assessment and Plan: 1. Secondary erythrocytosis   2. History of MI (myocardial infarction)   3. Long-term current use of testosterone replacement therapy     Labs done at [facility] was reviewed and discussed with patient. Hct 50.1 Proceed with phlebotomy, with goal of HCT <50  Follow Up Instructions: 3 months.    I discussed the assessment and treatment plan with the patient. The patient was provided an opportunity to ask questions and all were answered. The patient agreed with the plan and demonstrated an understanding of the instructions.  The patient was advised to call back or seek an in-person  evaluation if the symptoms worsen or if the condition fails to improve as anticipated.   Earlie Server, MD 03/04/2021 11:04 PM

## 2021-03-05 ENCOUNTER — Inpatient Hospital Stay: Payer: Managed Care, Other (non HMO)

## 2021-03-05 DIAGNOSIS — D751 Secondary polycythemia: Secondary | ICD-10-CM

## 2021-03-05 NOTE — Progress Notes (Signed)
Therapeutic phlebotomy performed in [facility] using 20g angiocath. 587m removed. Pt tolerated procedure well. PO fluids provided. Vital signs stable at discharge.

## 2021-03-06 ENCOUNTER — Encounter: Payer: Self-pay | Admitting: Oncology

## 2021-03-09 ENCOUNTER — Other Ambulatory Visit: Payer: Self-pay | Admitting: *Deleted

## 2021-03-09 MED ORDER — EZETIMIBE 10 MG PO TABS: 10.0000 mg | ORAL_TABLET | Freq: Every day | ORAL | 0 refills | Status: DC

## 2021-04-21 NOTE — Telephone Encounter (Signed)
Please stop the statin.

## 2021-05-20 ENCOUNTER — Encounter: Payer: Self-pay | Admitting: Oncology

## 2021-05-26 ENCOUNTER — Other Ambulatory Visit: Payer: Self-pay | Admitting: Interventional Cardiology

## 2021-05-28 ENCOUNTER — Other Ambulatory Visit: Payer: Self-pay | Admitting: Interventional Cardiology

## 2021-05-31 NOTE — Progress Notes (Signed)
Cardiology Office Note:    Date:  06/02/2021   ID:  Stephen [name], DOB [birth year], MRN [id number]  PCP:  Stephen Ghent, MD  Cardiologist:  Stephen Grooms, MD   Referring MD: Stephen Ghent, MD   Chief Complaint  Patient presents with   Coronary Artery Disease   Hypertension   Follow-up    Hypogonadism Testosterone exogenous therapy Prediabetes    History of Present Illness:    Stephen [name] is a 60 y.o. male with a hx ofanterior ST elevation myocardial infarction treated with LAD drug-eluting stents 05/2014, post MI LVEF 60%, hypertension, hyperlipidemia, prior tobacco abuse, and erectile dysfunction.  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  He is having awful difficulty with lower extremity paresthesias.  He has neuropathy and was concerned that the statin was causing the problem.  We stopped the statin and the dysesthesias did not improve.  He is now seeing a neurologist and has a diagnosis of peripheral neuropathy.  Idiopathic and recurrence at this time.  He denies angina.  He does have some dyspnea on exertion if he goes upstairs.  There is no peripheral edema.  Past Medical History:  Diagnosis Date   Achilles tendon injury    right   Arthritis    back   Asthma    as a child   CAD S/P percutaneous coronary angioplasty 05/25/2014   ED (erectile dysfunction)    Elevated glucose    Erythrocytosis    [facility] [place] [facility]   GERD (gastroesophageal reflux disease)    Gout    Hyperlipidemia    Hypertension    Morton's neuroma    2014   Neuropathy    Seasonal allergies    uses advair in May   ST elevation myocardial infarction (STEMI) of anterior wall ([place]) 05/25/14   1 stent   Tobacco abuse, + cigars 05/27/2014    Past Surgical History:  Procedure Laterality Date   CORONARY ANGIOPLASTY WITH STENT PLACEMENT  05/25/14   Resolute integrity 3.5 X 18 mm DES 3.75 mm   Fx L forearm closed fx  as  a child   Hospital / asthma     multiple times as child   LEFT HEART CATHETERIZATION WITH CORONARY ANGIOGRAM N/A 05/25/2014   Procedure: LEFT HEART CATHETERIZATION WITH CORONARY ANGIOGRAM;  Surgeon: Stephen Grooms, MD;  Location: [facility] CATH LAB;  Service: Cardiovascular;  Laterality: N/A;   PERCUTANEOUS CORONARY STENT INTERVENTION (PCI-S)  05/25/2014   Procedure: PERCUTANEOUS CORONARY STENT INTERVENTION (PCI-S);  Surgeon: Stephen Grooms, MD;  Location: [facility] CATH LAB;  Service: Cardiovascular;;  PROX LAD     Current Medications: Current Meds  Medication Sig   amLODipine (NORVASC) 5 MG tablet Take 1 tablet (5 mg total) by mouth daily. Please keep upcoming appt in November 2022 with Dr. Tamala Julian before anymore refills. Thank you   aspirin 81 MG chewable tablet Chew 1 tablet (81 mg total) by mouth daily.   carvedilol (COREG) 12.5 MG tablet Take 1 tablet (12.5 mg total) by mouth 2 (two) times daily.   Coenzyme Q10 (CO Q 10) 100 MG CAPS Take 100 mg by mouth daily.   colchicine 0.6 MG tablet Take 1 tablet (0.6 mg total) by mouth daily as needed. (Patient taking differently: Take 0.6 mg by mouth daily as needed (Gout).)   Fluticasone-Salmeterol (ADVAIR) 100-50 MCG/DOSE AEPB Inhale 1 puff into the lungs daily as needed (allergies).  Ketotifen Fumarate (ALAWAY OP) Place 1 drop into both eyes daily as needed (seasonal allergies).   nitroGLYCERIN (NITROSTAT) 0.4 MG SL tablet Place 1 tablet (0.4 mg total) under the tongue every 5 (five) minutes as needed for chest pain.   sildenafil (REVATIO) 20 MG tablet Take 20 mg by mouth See admin instructions. Take 20 mg to 100 mg by mouth daily as needed for erectile dysfunction.   testosterone cypionate (DEPOTESTOSTERONE CYPIONATE) 200 MG/ML injection Inject 0.75 mLs (150 mg total) into the muscle every 14 (fourteen) days.   valsartan-hydrochlorothiazide (DIOVAN-HCT) 320-12.5 MG tablet TAKE 1 TABLET BY MOUTH  DAILY   [DISCONTINUED] rosuvastatin (CRESTOR) 20 MG tablet  Take 20 mg by mouth at bedtime.     Allergies:   Loratadine, Claritin-d 12 hour [loratadine-pseudoephedrine er], Kiwi extract, Penicillins, and Simvastatin   Social History   Socioeconomic History   Marital status: Married    Spouse name: Not on file   Number of children: 1   Years of education: Not on file   Highest education level: Not on file  Occupational History   Occupation: [employer]    Comment: supervisor of special chemistries  Tobacco Use   Smoking status: Former    Types: Cigars    Quit date: 05/25/2014    Years since quitting: 7.0   Smokeless tobacco: Never  Substance and Sexual Activity   Alcohol use: Yes    Alcohol/week: 2.0 standard drinks    Types: 2 Cans of beer per week    Comment: daily   Drug use: No   Sexual activity: Yes  Other Topics Concern   Not on file  Social History Narrative   One son, 1 stepson   Married 2001   From [place]   Works for [employer].    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    Social Determinants of Health   Financial Resource Strain: Not on file  Food Insecurity: Not on file  Transportation Needs: Not on file  Physical Activity: Not on file  Stress: Not on file  Social Connections: Not on file     Family History: The patient's family history includes AAA (abdominal aortic aneurysm) in his father; Alcohol abuse in his brother; Dementia in his father; Hyperlipidemia in his sister; Hypertension in his father and sister; Stroke in his mother; Stroke (age of onset: 73) in his father. There is no history of Prostate cancer or Colon cancer.  ROS:   Please see the history of present illness.    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  Says he sleeps well.  Prior history of snoring.  Has been reluctant to have a sleep study.  All other systems reviewed and are negative.  EKGs/Labs/Other Studies Reviewed:    The following studies were reviewed today: No new cardiac  imaging  EKG:  EKG sinus rhythm at 62 bpm.  Borderline first-degree AV block.  No change when compared to prior tracings.  Baseline artifact on this tracing.  Recent Labs: No results found for requested labs within last 8760 hours.  Recent Lipid Panel    Component Value Date/Time   CHOL 134 05/25/2020 0738   TRIG 80 05/25/2020 0738   HDL 41 05/25/2020 0738   CHOLHDL 3.3 05/25/2020 0738   CHOLHDL 3 07/28/2014 0903   VLDL 24.2 07/28/2014 0903   LDLCALC 77 05/25/2020 0738    Physical Exam:    VS:  BP (!) 142/82   Pulse 62  Ht 5\' 7"  (1.702 m)   Wt 221 lb 6.4 oz (100.4 kg)   SpO2 96%   BMI 34.68 kg/m     Wt Readings from Last 3 Encounters:  06/02/21 221 lb 6.4 oz (100.4 kg)  12/28/20 218 lb (98.9 kg)  12/02/20 221 lb 11.2 oz (100.6 kg)     GEN: Overweight. No acute distress HEENT: Normal NECK: No JVD. LYMPHATICS: No lymphadenopathy CARDIAC: No  murmur. RRR no gallop, or edema. VASCULAR:  Normal Pulses. No bruits. RESPIRATORY:  Clear to auscultation without rales, wheezing or rhonchi  ABDOMEN: Soft, non-tender, non-distended, No pulsatile mass, MUSCULOSKELETAL: No deformity  SKIN: Warm and dry NEUROLOGIC:  Alert and oriented x 3 PSYCHIATRIC:  Normal affect   ASSESSMENT:    1. CAD in native artery   2. Hyperlipidemia with target LDL less than 70   3. Essential hypertension   4. Severe obesity (BMI 35.0-39.9) with comorbidity ([place])    PLAN:    In order of problems listed above:  Secondary prevention reviewed.  He is getting ample physical activity.  He is working on his weight. Lipid panel will be obtained today.  Continue same dose of rosuvastatin. Blood pressure does well with home monitoring.  He has borderline today in office.  Salt reduction, weight loss, and avoidance of nonsteroidals discussed. He is working on his weight. Erythrocytosis is a risk factor for cardiac events  He requires the testosterone to maintain normal physiologic levels.   Overall  education and awareness concerning primary/secondary risk prevention was discussed in detail: LDL less than 70, hemoglobin A1c less than 7, blood pressure target less than 130/80 mmHg, >150 minutes of moderate aerobic activity per week, avoidance of smoking, weight control (via diet and exercise), and continued surveillance/management of/for obstructive sleep apnea.    Medication Adjustments/Labs and Tests Ordered: Current medicines are reviewed at length with the patient today.  Concerns regarding medicines are outlined above.  Orders Placed This Encounter  Procedures   EKG 12-Lead   Meds ordered this encounter  Medications   rosuvastatin (CRESTOR) 20 MG tablet    Sig: Take 1 tablet (20 mg total) by mouth at bedtime.    Dispense:  90 tablet    Refill:  3    There are no Patient Instructions on file for this visit.   Signed, Stephen Grooms, MD  06/02/2021 3:45 PM    [facility]

## 2021-06-01 ENCOUNTER — Other Ambulatory Visit: Payer: Self-pay | Admitting: Interventional Cardiology

## 2021-06-02 ENCOUNTER — Ambulatory Visit: Payer: Managed Care, Other (non HMO) | Admitting: Interventional Cardiology

## 2021-06-02 ENCOUNTER — Encounter: Payer: Self-pay | Admitting: Interventional Cardiology

## 2021-06-02 ENCOUNTER — Other Ambulatory Visit: Payer: Self-pay

## 2021-06-02 VITALS — BP 142/82 | HR 62 | Ht 67.0 in | Wt 221.4 lb

## 2021-06-02 DIAGNOSIS — E785 Hyperlipidemia, unspecified: Secondary | ICD-10-CM | POA: Diagnosis not present

## 2021-06-02 DIAGNOSIS — I2510 Atherosclerotic heart disease of native coronary artery without angina pectoris: Secondary | ICD-10-CM | POA: Diagnosis not present

## 2021-06-02 DIAGNOSIS — I10 Essential (primary) hypertension: Secondary | ICD-10-CM | POA: Diagnosis not present

## 2021-06-02 MED ORDER — ROSUVASTATIN CALCIUM 20 MG PO TABS: 20.0000 mg | ORAL_TABLET | Freq: Every day | ORAL | 3 refills | Status: DC

## 2021-06-02 NOTE — Patient Instructions (Signed)
Medication Instructions:  Your physician recommends that you continue on your current medications as directed. Please refer to the Current Medication list given to you today.  *If you need a refill on your cardiac medications before your next appointment, please call your pharmacy*   Lab Work: Lipid and Liver when you are fasting.  Take the sheets provided with you to [facility].   If you have labs (blood work) drawn today and your tests are completely normal, you will receive your results only by: [place] (if you have MyChart) OR A paper copy in the mail If you have any lab test that is abnormal or we need to change your treatment, we will call you to review the results.   Testing/Procedures: None   Follow-Up: At [facility], you and your health needs are our priority.  As part of our continuing mission to provide you with exceptional heart care, we have created designated Provider Care Teams.  These Care Teams include your primary Cardiologist (physician) and Advanced Practice Providers (APPs -  Physician Assistants and Nurse Practitioners) who all work together to provide you with the care you need, when you need it.  We recommend signing up for the patient portal called "MyChart".  Sign up information is provided on this After Visit Summary.  MyChart is used to connect with patients for Virtual Visits (Telemedicine).  Patients are able to view lab/test results, encounter notes, upcoming appointments, etc.  Non-urgent messages can be sent to your provider as well.   To learn more about what you can do with MyChart, go to [email or web address].    Your next appointment:   1 year(s)  The format for your next appointment:   In Person  Provider:   Sinclair Grooms, MD     Other Instructions

## 2021-06-03 ENCOUNTER — Inpatient Hospital Stay: Payer: Managed Care, Other (non HMO) | Attending: Oncology | Admitting: Nurse Practitioner

## 2021-06-03 ENCOUNTER — Telehealth: Payer: Managed Care, Other (non HMO) | Admitting: Oncology

## 2021-06-03 ENCOUNTER — Encounter: Payer: Self-pay | Admitting: Nurse Practitioner

## 2021-06-03 DIAGNOSIS — D751 Secondary polycythemia: Secondary | ICD-10-CM

## 2021-06-03 NOTE — Progress Notes (Signed)
Patient called/ pre- screened for virtual appoinment today with NP. No new concerns voiced

## 2021-06-03 NOTE — Progress Notes (Signed)
HEMATOLOGY-ONCOLOGY TeleHEALTH VISIT PROGRESS NOTE  I connected with Stephen [name] on 06/03/21  at  3:00 PM EST by video enabled telemedicine visit and verified that I am speaking with the correct person using two identifiers. I discussed the limitations, risks, security and privacy concerns of performing an evaluation and management service by telemedicine and the availability of in-person appointments. The patient expressed understanding and agreed to proceed.   Other persons participating in the visit and their role in the encounter:  None  Patient's location: work Provider's location: office Chief Complaint: Erythrocytosis   INTERVAL HISTORY Stephen [name] is a 60 y.o. male with above history of erythrocytosis who presents via telemedicine for discussion of lab results and possible phlebotomy. He feels well and denies complaints. Continue testosterone therapy.   Review of Systems  Constitutional:  Negative for appetite change, chills, fatigue, fever and unexpected weight change.  HENT:   Negative for hearing loss and voice change.   Eyes:  Negative for eye problems and icterus.  Respiratory:  Negative for chest tightness, cough and shortness of breath.   Cardiovascular:  Negative for chest pain and leg swelling.  Gastrointestinal:  Negative for abdominal distention and abdominal pain.  Endocrine: Negative for hot flashes.  Genitourinary:  Negative for difficulty urinating, dysuria and frequency.   Musculoskeletal:  Negative for arthralgias.  Skin:  Negative for itching and rash.  Neurological:  Negative for light-headedness and numbness.  Hematological:  Negative for adenopathy. Does not bruise/bleed easily.  Psychiatric/Behavioral:  Negative for confusion.    Past Medical History:  Diagnosis Date   Achilles tendon injury    right   Arthritis    back   Asthma    as a child   CAD S/P percutaneous coronary angioplasty 05/25/2014   ED (erectile dysfunction)    Elevated  glucose    Erythrocytosis    [facility] [place] [facility]   GERD (gastroesophageal reflux disease)    Gout    Hyperlipidemia    Hypertension    Morton's neuroma    2014   Neuropathy    Seasonal allergies    uses advair in May   ST elevation myocardial infarction (STEMI) of anterior wall ([place]) 05/25/14   1 stent   Tobacco abuse, + cigars 05/27/2014   Past Surgical History:  Procedure Laterality Date   CORONARY ANGIOPLASTY WITH STENT PLACEMENT  05/25/14   Resolute integrity 3.5 X 18 mm DES 3.75 mm   Fx L forearm closed fx  as a child   Hospital / asthma     multiple times as child   LEFT HEART CATHETERIZATION WITH CORONARY ANGIOGRAM N/A 05/25/2014   Procedure: LEFT HEART CATHETERIZATION WITH CORONARY ANGIOGRAM;  Surgeon: Sinclair Grooms, MD;  Location: [facility] CATH LAB;  Service: Cardiovascular;  Laterality: N/A;   PERCUTANEOUS CORONARY STENT INTERVENTION (PCI-S)  05/25/2014   Procedure: PERCUTANEOUS CORONARY STENT INTERVENTION (PCI-S);  Surgeon: Sinclair Grooms, MD;  Location: [facility] CATH LAB;  Service: Cardiovascular;;  PROX LAD     Family History  Problem Relation Age of Onset   Stroke Mother    Hypertension Father    Dementia Father    Stroke Father 48       Multiple   AAA (abdominal aortic aneurysm) Father    Hyperlipidemia Sister    Hypertension Sister    Alcohol abuse Brother        organ failure   Prostate cancer Neg Hx    Colon cancer Neg Hx  Social History   Socioeconomic History   Marital status: Married    Spouse name: Not on file   Number of children: 1   Years of education: Not on file   Highest education level: Not on file  Occupational History   Occupation: [employer]    Comment: supervisor of special chemistries  Tobacco Use   Smoking status: Former    Types: Cigars    Quit date: 05/25/2014    Years since quitting: 7.0   Smokeless tobacco: Never  Substance and Sexual Activity   Alcohol use: Yes    Alcohol/week: 2.0 standard drinks    Types: 2 Cans of  beer per week    Comment: daily   Drug use: No   Sexual activity: Yes  Other Topics Concern   Not on file  Social History Narrative   One son, 1 stepson   Married 2001   From [place]   Works for [employer].    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    Social Determinants of Health   Financial Resource Strain: Not on file  Food Insecurity: Not on file  Transportation Needs: Not on file  Physical Activity: Not on file  Stress: Not on file  Social Connections: Not on file  Intimate Partner Violence: Not on file    Current Outpatient Medications on File Prior to Visit  Medication Sig Dispense Refill   amLODipine (NORVASC) 5 MG tablet Take 1 tablet (5 mg total) by mouth daily. Please keep upcoming appt in November 2022 with Dr. Tamala Julian before anymore refills. Thank you 90 tablet 0   aspirin 81 MG chewable tablet Chew 1 tablet (81 mg total) by mouth daily.     carvedilol (COREG) 12.5 MG tablet Take 1 tablet (12.5 mg total) by mouth 2 (two) times daily. 180 tablet 3   Coenzyme Q10 (CO Q 10) 100 MG CAPS Take 100 mg by mouth daily.     colchicine 0.6 MG tablet Take 1 tablet (0.6 mg total) by mouth daily as needed. (Patient taking differently: Take 0.6 mg by mouth daily as needed (Gout).) 90 tablet 0   Fluticasone-Salmeterol (ADVAIR) 100-50 MCG/DOSE AEPB Inhale 1 puff into the lungs daily as needed (allergies).     nitroGLYCERIN (NITROSTAT) 0.4 MG SL tablet Place 1 tablet (0.4 mg total) under the tongue every 5 (five) minutes as needed for chest pain. 25 tablet 3   rosuvastatin (CRESTOR) 20 MG tablet Take 1 tablet (20 mg total) by mouth at bedtime. 90 tablet 3   sildenafil (REVATIO) 20 MG tablet Take 20 mg by mouth See admin instructions. Take 20 mg to 100 mg by mouth daily as needed for erectile dysfunction.     testosterone cypionate (DEPOTESTOSTERONE CYPIONATE) 200 MG/ML injection Inject 0.75 mLs (150 mg total) into the muscle every 14 (fourteen) days.      valsartan-hydrochlorothiazide (DIOVAN-HCT) 320-12.5 MG tablet TAKE 1 TABLET BY MOUTH  DAILY 90 tablet 0   ezetimibe (ZETIA) 10 MG tablet Take 1 tablet (10 mg total) by mouth daily. (Patient not taking: Reported on 06/02/2021) 90 tablet 0   gabapentin (NEURONTIN) 100 MG capsule Take 100 mg twice daily for one week, then increase to 200 mg twice daily (Patient not taking: Reported on 06/02/2021)     gabapentin (NEURONTIN) 100 MG capsule Take by mouth. (Patient not taking: Reported on 06/02/2021)     gabapentin (NEURONTIN) 300 MG capsule Take 300 mg twice a day for 2 weeks, then increase  to 300 mg three time a day. (Patient not taking: Reported on 06/02/2021)     Ketotifen Fumarate (ALAWAY OP) Place 1 drop into both eyes daily as needed (seasonal allergies). (Patient not taking: Reported on 06/03/2021)     Lidocaine 3 % CREA Apply 1 application topically 2 (two) times daily. (Patient not taking: Reported on 06/02/2021)     oxyCODONE-acetaminophen (PERCOCET) 5-325 MG tablet Take 1 tablet by mouth every 6 (six) hours as needed for severe pain. (Patient not taking: Reported on 06/02/2021) 12 tablet 0   No current facility-administered medications on file prior to visit.    Allergies  Allergen Reactions   Loratadine Anxiety   Claritin-D 12 Hour [Loratadine-Pseudoephedrine Er] Other (See Comments)    shaky   Kiwi Extract Other (See Comments)    Throat irritation, swelling   Penicillins Other (See Comments)    Unknown childhood allergic reaction   Simvastatin Other (See Comments)    Numbness in toes, raised A1C, affected memory, fatigue       Observations/Objective: Today's Vitals   06/03/21 1357  PainSc: 0-No pain   There is no height or weight on file to calculate BMI.  Physical Exam Pulmonary:     Effort: Pulmonary effort is normal.  Neurological:     Mental Status: He is alert and oriented to person, place, and time.  Psychiatric:        Mood and Affect: Mood normal.        Behavior:  Behavior normal.   Assessment and Plan: No diagnosis found.  Labs from [place] reviewed. Hematocrit 49.9. Patient's goal hct < 50 however, given heart history he has previously received phlebotomy at this threshold. Consider phlebotomy to keep hct < 49. Proceed with phlebotomy tomorrow as scheduled.   Follow Up Instructions: 3 month - see Dr Tasia Catchings virtually then day later possible phlebotomy  I discussed the assessment and treatment plan with the patient. The patient was provided an opportunity to ask questions and all were answered. The patient agreed with the plan and demonstrated an understanding of the instructions.   The patient was advised to call back or seek an in-person evaluation if the symptoms worsen or if the condition fails to improve as anticipated.   I spent 15 minutes face-to-face video visit time dedicated to the care of this patient on the date of this encounter to include pre-visit review of hematology notes, labs, face-to-face time with the patient, and post visit ordering of testing/documentation.   Verlon Au, NP 06/03/2021 3:31 PM

## 2021-06-04 ENCOUNTER — Inpatient Hospital Stay: Payer: Managed Care, Other (non HMO)

## 2021-06-04 ENCOUNTER — Other Ambulatory Visit: Payer: Self-pay

## 2021-06-04 VITALS — BP 135/77 | HR 63 | Temp 98.1°F | Resp 18

## 2021-06-04 DIAGNOSIS — D751 Secondary polycythemia: Secondary | ICD-10-CM

## 2021-06-04 NOTE — Patient Instructions (Signed)

## 2021-06-04 NOTE — Progress Notes (Signed)
Therapeutic phlebotomy performed per order from Beckey Rutter NP who had a virtual visit with patient yesterday. 500 ml of blood removed. Pt tolerated well. Accepted a beverage. VSS. Discharged to home.

## 2021-06-10 ENCOUNTER — Other Ambulatory Visit: Payer: Self-pay | Admitting: Interventional Cardiology

## 2021-06-14 ENCOUNTER — Encounter: Payer: Self-pay | Admitting: Oncology

## 2021-06-15 ENCOUNTER — Encounter: Payer: Self-pay | Admitting: Interventional Cardiology

## 2021-06-15 MED ORDER — ROSUVASTATIN CALCIUM 20 MG PO TABS: 20.0000 mg | ORAL_TABLET | Freq: Every day | ORAL | 3 refills | Status: DC

## 2021-06-17 LAB — FECAL OCCULT BLOOD, IMMUNOCHEMICAL: IFOBT: NEGATIVE

## 2021-06-29 LAB — LIPID PANEL
Chol/HDL Ratio: 3.3 ratio (ref 0.0–5.0)
Cholesterol, Total: 128 mg/dL (ref 100–199)
HDL: 39 mg/dL — ABNORMAL LOW (ref >39)
LDL Chol Calc (NIH): 62 mg/dL (ref 0–99)
Triglycerides: 158 mg/dL — ABNORMAL HIGH (ref 0–149)
VLDL Cholesterol Cal: 27 mg/dL (ref 5–40)

## 2021-06-29 LAB — HEPATIC FUNCTION PANEL
ALT: 22 IU/L (ref 0–44)
AST: 15 IU/L (ref 0–40)
Albumin: 4.6 g/dL (ref 3.8–4.9)
Alkaline Phosphatase: 84 IU/L (ref 44–121)
Bilirubin Total: 0.3 mg/dL (ref 0.0–1.2)
Bilirubin, Direct: 0.13 mg/dL (ref 0.00–0.40)
Total Protein: 7.2 g/dL (ref 6.0–8.5)

## 2021-07-05 ENCOUNTER — Other Ambulatory Visit: Payer: Self-pay | Admitting: Interventional Cardiology

## 2021-08-18 ENCOUNTER — Encounter: Payer: Self-pay | Admitting: Interventional Cardiology

## 2021-08-18 ENCOUNTER — Telehealth: Payer: Self-pay | Admitting: Interventional Cardiology

## 2021-08-18 NOTE — Telephone Encounter (Signed)
Returned call to Pt.  He states over the last week or so he has had a "strange discomfort in my chest".  He is calling out of an "abundance of caution".  He states the pain is sharp.  No pressure.  Rates pain as 3/10.  He states he has this pain when he lifts his arm, or twists, or lifts something.  It is located in the left to middle of chest.  He has no other symptoms.    He states the pain is so brief he does not take nitro.  BP today 141/82.    Sent Pt instructions on how to send a copy of his EKG via MyChart.  Pt would like to know if Dr. Tamala Julian would order some tests to investigate.  Advised would send to Dr. Thompson Caul nurse for follow up.

## 2021-08-18 NOTE — Telephone Encounter (Signed)
Pt c/o of Chest Pain: STAT if CP now or developed within 24 hours  1. Are you having CP right now? no  2. Are you experiencing any other symptoms (ex. SOB, nausea, vomiting, sweating)? no  3. How long have you been experiencing CP? A few seconds at a time   4. Is your CP continuous or coming and going? Comes and goes.. brought on sometime by lifting things   5. Have you taken Nitroglycerin? no ?

## 2021-08-20 ENCOUNTER — Encounter: Payer: Self-pay | Admitting: Intensive Care

## 2021-08-20 ENCOUNTER — Encounter: Payer: Self-pay | Admitting: Interventional Cardiology

## 2021-08-20 ENCOUNTER — Emergency Department: Payer: Managed Care, Other (non HMO)

## 2021-08-20 ENCOUNTER — Emergency Department
Admission: EM | Admit: 2021-08-20 | Discharge: 2021-08-20 | Disposition: A | Discharge status: Home / Self Care | Payer: Managed Care, Other (non HMO) | Attending: Emergency Medicine | Admitting: Emergency Medicine

## 2021-08-20 ENCOUNTER — Other Ambulatory Visit: Payer: Self-pay

## 2021-08-20 DIAGNOSIS — R0789 Other chest pain: Secondary | ICD-10-CM

## 2021-08-20 DIAGNOSIS — R0781 Pleurodynia: Secondary | ICD-10-CM | POA: Diagnosis present

## 2021-08-20 DIAGNOSIS — I2510 Atherosclerotic heart disease of native coronary artery without angina pectoris: Secondary | ICD-10-CM | POA: Insufficient documentation

## 2021-08-20 DIAGNOSIS — R0600 Dyspnea, unspecified: Secondary | ICD-10-CM | POA: Insufficient documentation

## 2021-08-20 DIAGNOSIS — Z7982 Long term (current) use of aspirin: Secondary | ICD-10-CM | POA: Insufficient documentation

## 2021-08-20 DIAGNOSIS — I10 Essential (primary) hypertension: Secondary | ICD-10-CM | POA: Diagnosis not present

## 2021-08-20 LAB — CBC
HCT: 50.5 % (ref 39.0–52.0)
Hemoglobin: 17 g/dL (ref 13.0–17.0)
MCH: 30.2 pg (ref 26.0–34.0)
MCHC: 33.7 g/dL (ref 30.0–36.0)
MCV: 89.9 fL (ref 80.0–100.0)
Platelets: 243 10*3/uL (ref 150–400)
RBC: 5.62 MIL/uL (ref 4.22–5.81)
RDW: 12.6 % (ref 11.5–15.5)
WBC: 7.4 10*3/uL (ref 4.0–10.5)
nRBC: 0 % (ref 0.0–0.2)

## 2021-08-20 LAB — BASIC METABOLIC PANEL
Anion gap: 8 (ref 5–15)
BUN: 14 mg/dL (ref 6–20)
CO2: 30 mmol/L (ref 22–32)
Calcium: 9.1 mg/dL (ref 8.9–10.3)
Chloride: 96 mmol/L — ABNORMAL LOW (ref 98–111)
Creatinine, Ser: 1.1 mg/dL (ref 0.61–1.24)
GFR, Estimated: >60 mL/min (ref >60)
Glucose, Bld: 153 mg/dL — ABNORMAL HIGH (ref 70–99)
Potassium: 3.6 mmol/L (ref 3.5–5.1)
Sodium: 134 mmol/L — ABNORMAL LOW (ref 135–145)

## 2021-08-20 LAB — TROPONIN I (HIGH SENSITIVITY)
Troponin I (High Sensitivity): 11 ng/L (ref <18)
Troponin I (High Sensitivity): 11 ng/L (ref <18)

## 2021-08-20 LAB — D-DIMER, QUANTITATIVE: D-Dimer, Quant: 0.51 ug/mL-FEU — ABNORMAL HIGH (ref 0.00–0.50)

## 2021-08-20 MED ORDER — LIDOCAINE 5 % EX PTCH
1.0000 | MEDICATED_PATCH | CUTANEOUS | Status: DC
  Administered 2021-08-20: 1 via TRANSDERMAL
  Filled 2021-08-20: qty 1

## 2021-08-20 MED ORDER — LIDOCAINE 5 % EX PTCH: 1.0000 | MEDICATED_PATCH | Freq: Two times a day (BID) | CUTANEOUS | 0 refills | Status: AC

## 2021-08-20 NOTE — ED Triage Notes (Signed)
Patient c/o chest pain for a few days. Reports hypertension at home this AM. Took 324 aspirin and 1 nitro at home today.

## 2021-08-20 NOTE — ED Provider Notes (Signed)
[facility] Provider Note    Event Date/Time   First MD Initiated Contact with Patient 08/20/21 [phone]     (approximate)   History   Chest Pain   HPI  Stephen [name] is a 61 y.o. male who presents to the ED for evaluation of Chest Pain   I review [facility] cardiology clinic visit from 11/16.  Patient has a history of anterior STEMI with DES x1 in 2015.  Previous tobacco abuse, HTN and HLD. Obesity.  Supplemental testosterone every few weeks.  Patient presents to the ED, accompanied by his wife, for evaluation of left-sided chest pains over the past 4 to 5 days.  He reports the pain is positional and pleuritic, only worsening with movement of his shoulders and arms, as well as when taking a deep breath.  He reports chronic dyspnea on exertion, with feeling winded after climbing steps, but has been this way for the past year without any acute worsening or decreased exercise tolerance acutely.  Looks like his last stress test was 09/2018 without evidence of ischemia.  He awoke this morning with similar chest pain, and noted his blood pressure to be elevated to the 169C systolic.  Due to his hypertensive reading, he presents to the ED for evaluation.  He has already taken 324 of aspirin today.  Denies chest pain right now, unless he abducts his left arm or presses his left chest.  Physical Exam   Triage Vital Signs: ED Triage Vitals  Enc Vitals Group     BP 08/20/21 0707 (!) 165/89     Pulse Rate 08/20/21 0707 66     Resp 08/20/21 0707 16     Temp 08/20/21 0707 97.7 F (36.5 C)     Temp Source 08/20/21 0707 Oral     SpO2 08/20/21 0707 93 %     Weight 08/20/21 0710 220 lb (99.8 kg)     Height 08/20/21 0710 5\' 7"  (1.702 m)     Head Circumference --      Peak Flow --      Pain Score 08/20/21 0710 2     Pain Loc --      Pain Edu? --      Excl. in [place]? --     Most recent vital signs: Vitals:   08/20/21 0707 08/20/21 0809  BP: (!) 165/89 (!) 171/90   Pulse: 66 62  Resp: 16 19  Temp: 97.7 F (36.5 C)   SpO2: 93% 97%    General: Awake, no distress.  Sitting up in a bedside chair with his wife, pleasant and conversational in full sentences. CV:  Good peripheral perfusion. RRR Resp:  Normal effort.  Abd:  No distention.  MSK:  No deformity noted.  Chest pain is reproducible.  No overlying skin changes or signs of trauma. Neuro:  No focal deficits appreciated. Cranial nerves II through XII intact 5/5 strength and sensation in all 4 extremities Other:     ED Results / Procedures / Treatments   Labs (all labs ordered are listed, but only abnormal results are displayed) Labs Reviewed  BASIC METABOLIC PANEL - Abnormal; Notable for the following components:      Result Value   Sodium 134 (*)    Chloride 96 (*)    Glucose, Bld 153 (*)    All other components within normal limits  D-DIMER, QUANTITATIVE - Abnormal; Notable for the following components:   D-Dimer, Quant 0.51 (*)    All other components within  normal limits  CBC  TROPONIN I (HIGH SENSITIVITY)  TROPONIN I (HIGH SENSITIVITY)    EKG Sinus rhythm, rate of 67 bpm.  Rightward axis.  First-degree AV block with PR interval 216 ms.  Otherwise normal intervals.  T wave inversion to lead III without STEMI. When compared to EKG from 11/16, this inversion seems new.  He had some slightly nonspecific/biphasic T waves to lead III then.  RADIOLOGY CXR reviewed by me without evidence of acute cardiopulmonary pathology.   Official radiology report(s): DG Chest 2 View  Result Date: 08/20/2021 CLINICAL DATA:  cp EXAM: CHEST - 2 VIEW COMPARISON:  November 2015 FINDINGS: The cardiomediastinal silhouette is within normal limits. No pleural effusion. No pneumothorax. No lobar consolidation. Small linear area of subsegmental atelectasis in the left midlung. Stable mild anterior wedging of the midthoracic spine. IMPRESSION: Small linear band of subsegmental atelectasis in the left mid  lung. No other acute findings in the chest. Electronically Signed   By: Albin Felling M.D.   On: 08/20/2021 07:50    PROCEDURES and INTERVENTIONS:  .1-3 Lead EKG Interpretation Performed by: Vladimir Crofts, MD Authorized by: Vladimir Crofts, MD     Interpretation: normal     ECG rate:  64   ECG rate assessment: normal     Rhythm: sinus rhythm     Ectopy: none     Conduction: normal    Medications  lidocaine (LIDODERM) 5 % 1 patch (1 patch Transdermal Patch Applied 08/20/21 0808)     IMPRESSION / MDM / ASSESSMENT AND PLAN / ED COURSE  I reviewed the triage vital signs and the nursing notes.  61 year old male with history of CAD presents to the ED with atypical chest pains, without evidence of significant acute pathology and ultimately suitable for outpatient management.  He looks clinically well and has a reassuring examination.  Chest pain is reproducible on palpation.  He has no evidence of neurologic or vascular deficits.  No signs of trauma, skin changes or herpes zoster.  His EKG does not show a STEMI, and shows chronic first-degree AV block and nonspecific ST changes to lead III.  His D-dimer is negative, adjusted for the age, and has 2 negative troponins.  His symptoms resolved with a lidocaine patch and his work-up is otherwise benign.  No significant metabolic derangements or hematologic derangements.  CXR is clear.  I certainly considered medical admission for observation for this patient, but he is requesting discharge, to think is reasonable.  He is well connected to cardiology as an outpatient and we discussed appropriate return precautions for the ED.  Clinical Course as of 08/20/21 1055  Fri Aug 20, 2021  0756 Discussed plan of care with the patient.  Discussed D-dimer testing and screening for NSTEMI.  We discussed possible etiologies of his pain and uncertain disposition, pending work-up.  He is in agreement. [DS]  9381 His age-adjusted D-dimer is negative. [DS]  8299  Reassessed.  Feeling better with the lidocaine patch and we discussed benign work-up so far. [DS]  3716 [id number].  Feels well. [DS]  7510 We discussed benign work-up and disposition.  He is requesting discharge, to think is reasonable.  We discussed following up with a cardiologist and appropriate return precautions for the ED. [DS]    Clinical Course User Index [DS] Vladimir Crofts, MD     FINAL CLINICAL IMPRESSION(S) / ED DIAGNOSES   Final diagnoses:  Other chest pain     Rx / DC Orders   ED Discharge  Orders          Ordered    lidocaine (LIDODERM) 5 %  Every 12 hours        08/20/21 1038             Note:  This document was prepared using Dragon voice recognition software and may include unintentional dictation errors.   Vladimir Crofts, MD 08/20/21 1056

## 2021-08-20 NOTE — Discharge Instructions (Signed)
Use Tylenol for pain and fevers.  Up to 1000 mg per dose, up to 4 times per day.  Do not take more than 4000 mg of Tylenol/acetaminophen within 24 hours.. ° °Please use lidocaine patches at your site of pain.  Apply 1 patch at a time, leave on for 12 hours, then remove for 12 hours.  12 hours on, 12 hours off.  Do not apply more than 1 patch at a time. ° °

## 2021-08-23 NOTE — Telephone Encounter (Signed)
Start Spironolactone 12.5 mg daily BMET 1-2 weeks.

## 2021-08-27 ENCOUNTER — Encounter: Payer: Self-pay | Admitting: Oncology

## 2021-09-02 ENCOUNTER — Encounter: Payer: Self-pay | Admitting: Oncology

## 2021-09-02 ENCOUNTER — Inpatient Hospital Stay: Payer: Managed Care, Other (non HMO) | Attending: Oncology | Admitting: Oncology

## 2021-09-02 ENCOUNTER — Other Ambulatory Visit: Payer: Self-pay

## 2021-09-02 DIAGNOSIS — Z79890 Hormone replacement therapy (postmenopausal): Secondary | ICD-10-CM | POA: Diagnosis not present

## 2021-09-02 DIAGNOSIS — D751 Secondary polycythemia: Secondary | ICD-10-CM | POA: Diagnosis not present

## 2021-09-02 DIAGNOSIS — I252 Old myocardial infarction: Secondary | ICD-10-CM

## 2021-09-02 NOTE — Progress Notes (Signed)
HEMATOLOGY-ONCOLOGY TeleHEALTH VISIT PROGRESS NOTE  I connected with Stephen [name] on 09/02/21  at  1:45 PM EST by video enabled telemedicine visit and verified that I am speaking with the correct person using two identifiers. I discussed the limitations, risks, security and privacy concerns of performing an evaluation and management service by telemedicine and the availability of in-person appointments. The patient expressed understanding and agreed to proceed.   Other persons participating in the visit and their role in the encounter:  None  Patient's location: Home  Provider's location: office Chief Complaint: Erythrocytosis   INTERVAL HISTORY Stephen [name] is a 61 y.o. male who has above history reviewed by me today presents for follow up visit for erythrocytosis Patient is on testosterone replacement therapy.  Patient recently presented to the emergency room for evaluation of chest pain.  Per ER evaluation, chest pain was reproducible on palpation.  EKG did not show any ST elevation, D-dimer was negative.  Negative troponins.  Chest x-ray was negative.  Patient was discharged and patient plans to follow-up with cardiology.  Today he has known chest pain symptoms.  Review of Systems  Constitutional:  Negative for appetite change, chills, fatigue, fever and unexpected weight change.  HENT:   Negative for hearing loss and voice change.   Eyes:  Negative for eye problems and icterus.  Respiratory:  Negative for chest tightness, cough and shortness of breath.   Cardiovascular:  Negative for chest pain and leg swelling.  Gastrointestinal:  Negative for abdominal distention and abdominal pain.  Endocrine: Negative for hot flashes.  Genitourinary:  Negative for difficulty urinating, dysuria and frequency.   Musculoskeletal:  Negative for arthralgias.  Skin:  Negative for itching and rash.  Neurological:  Negative for light-headedness and numbness.  Hematological:  Negative for  adenopathy. Does not bruise/bleed easily.  Psychiatric/Behavioral:  Negative for confusion.    Past Medical History:  Diagnosis Date   Achilles tendon injury    right   Arthritis    back   Asthma    as a child   CAD S/P percutaneous coronary angioplasty 05/25/2014   ED (erectile dysfunction)    Elevated glucose    Erythrocytosis    SEES [facility] Celesta Funderburk [facility] [place] [facility]   GERD (gastroesophageal reflux disease)    Gout    Hyperlipidemia    Hypertension    Morton's neuroma    2014   Neuropathy    Seasonal allergies    uses advair in May   ST elevation myocardial infarction (STEMI) of anterior wall ([place]) 05/25/14   1 stent   Tobacco abuse, + cigars 05/27/2014   Past Surgical History:  Procedure Laterality Date   CORONARY ANGIOPLASTY WITH STENT PLACEMENT  05/25/14   Resolute integrity 3.5 X 18 mm DES 3.75 mm   Fx L forearm closed fx  as a child   Hospital / asthma     multiple times as child   LEFT HEART CATHETERIZATION WITH CORONARY ANGIOGRAM N/A 05/25/2014   Procedure: LEFT HEART CATHETERIZATION WITH CORONARY ANGIOGRAM;  Surgeon: Sinclair Grooms, MD;  Location: [facility] C[facility] LAB;  Service: Cardiovascular;  Laterality: N/A;   PERCUTANEOUS CORONARY STENT INTERVENTION (PCI-S)  05/25/2014   Procedure: PERCUTANEOUS CORONARY STENT INTERVENTION (PCI-S);  Surgeon: Sinclair Grooms, MD;  Location: [facility] CATH LAB;  Service: Cardiovascular;;  PROX LAD     Family History  Problem Relation Age of Onset   Stroke Mother    Hypertension Father    Dementia Father  Stroke Father 43       Multiple   AAA (abdominal aortic aneurysm) Father    Hyperlipidemia Sister    Hypertension Sister    Alcohol abuse Brother        organ failure   Prostate cancer Neg Hx    Colon cancer Neg Hx     Social History   Socioeconomic History   Marital status: Married    Spouse name: Not on file   Number of children: 1   Years of education: Not on file   Highest education level: Not on file  Occupational History    Occupation: [employer]    Comment: Librarian, academic of special chemistries  Tobacco Use   Smoking status: Former    Types: Cigars    Quit date: 05/25/2014    Years since quitting: 7.2   Smokeless tobacco: Never  Substance and Sexual Activity   Alcohol use: Yes    Alcohol/week: 14.0 standard drinks    Types: 14 Cans of beer per week    Comment: daily   Drug use: No   Sexual activity: Yes  Other Topics Concern   Not on file  Social History Narrative   One son, 1 stepson   Married 2001   From [place]   Works for [employer].    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    Social Determinants of Health   Financial Resource Strain: Not on file  Food Insecurity: Not on file  Transportation Needs: Not on file  Physical Activity: Not on file  Stress: Not on file  Social Connections: Not on file  Intimate Partner Violence: Not on file    Current Outpatient Medications on File Prior to Visit  Medication Sig Dispense Refill   amLODipine (NORVASC) 5 MG tablet TAKE 1 TABLET BY MOUTH DAILY. 90 tablet 3   aspirin 81 MG chewable tablet Chew 1 tablet (81 mg total) by mouth daily.     carvedilol (COREG) 12.5 MG tablet Take 1 tablet (12.5 mg total) by mouth 2 (two) times daily. 180 tablet 3   Coenzyme Q10 (CO Q 10) 100 MG CAPS Take 100 mg by mouth daily.     Fluticasone-Salmeterol (ADVAIR) 100-50 MCG/DOSE AEPB Inhale 1 puff into the lungs daily as needed (allergies).     rosuvastatin (CRESTOR) 20 MG tablet Take 1 tablet (20 mg total) by mouth at bedtime. 90 tablet 3   sildenafil (REVATIO) 20 MG tablet Take 20 mg by mouth See admin instructions. Take 20 mg to 100 mg by mouth daily as needed for erectile dysfunction.     testosterone cypionate (DEPOTESTOSTERONE CYPIONATE) 200 MG/ML injection Inject 0.75 mLs (150 mg total) into the muscle every 14 (fourteen) days.     valsartan-hydrochlorothiazide (DIOVAN-HCT) 320-12.5 MG tablet TAKE 1 TABLET BY MOUTH  DAILY 90 tablet 0   colchicine 0.6  MG tablet Take 1 tablet (0.6 mg total) by mouth daily as needed. (Patient not taking: Reported on 09/02/2021) 90 tablet 0   Ketotifen Fumarate (ALAWAY OP) Place 1 drop into both eyes daily as needed (seasonal allergies). (Patient not taking: Reported on 06/03/2021)     lidocaine (LIDODERM) 5 % Place 1 patch onto the skin every 12 (twelve) hours. Remove & Discard patch within 12 hours or as directed by MD (Patient not taking: Reported on 09/02/2021) 10 patch 0   Lidocaine 3 % CREA Apply 1 application topically 2 (two) times daily. (Patient not taking: Reported on 06/02/2021)  nitroGLYCERIN (NITROSTAT) 0.4 MG SL tablet Place 1 tablet (0.4 mg total) under the tongue every 5 (five) minutes as needed for chest pain. (Patient not taking: Reported on 09/02/2021) 25 tablet 3   oxyCODONE-acetaminophen (PERCOCET) 5-325 MG tablet Take 1 tablet by mouth every 6 (six) hours as needed for severe pain. (Patient not taking: Reported on 06/02/2021) 12 tablet 0   No current facility-administered medications on file prior to visit.    Allergies  Allergen Reactions   Loratadine Anxiety   Claritin-D 12 Hour [Loratadine-Pseudoephedrine Er] Other (See Comments)    shaky   Kiwi Extract Other (See Comments)    Throat irritation, swelling   Penicillins Other (See Comments)    Unknown childhood allergic reaction   Simvastatin Other (See Comments)    Numbness in toes, raised A1C, affected memory, fatigue   Gabapentin Rash       Observations/Objective: There were no vitals filed for this visit.  There is no height or weight on file to calculate BMI.  Physical Exam Neurological:     Mental Status: He is alert.    CBC    Component Value Date/Time   WBC 7.4 08/20/2021 0711   RBC 5.62 08/20/2021 0711   HGB 17.0 08/20/2021 0711   HGB 16.3 05/25/2020 0738   HCT 50.5 08/20/2021 0711   HCT 49.2 05/25/2020 0738   PLT 243 08/20/2021 0711   PLT 259 05/25/2020 0738   MCV 89.9 08/20/2021 0711   MCV 90 05/25/2020  0738   MCV 96 05/25/2014 0940   MCH 30.2 08/20/2021 0711   MCHC 33.7 08/20/2021 0711   RDW 12.6 08/20/2021 0711   RDW 13.3 05/25/2020 0738   RDW 13.3 05/25/2014 0940   LYMPHSABS 1.6 05/25/2020 0738   EOSABS 0.5 (H) 05/25/2020 0738   BASOSABS 0.0 05/25/2020 0738    CMP     Component Value Date/Time   NA 134 (L) 08/20/2021 0711   NA 136 05/25/2020 0738   NA 141 05/25/2014 0940   K 3.6 08/20/2021 0711   K 3.7 05/25/2014 0940   CL 96 (L) 08/20/2021 0711   CL 104 05/25/2014 0940   CO2 30 08/20/2021 0711   CO2 30 05/25/2014 0940   GLUCOSE 153 (H) 08/20/2021 0711   GLUCOSE 93 05/25/2014 0940   BUN 14 08/20/2021 0711   BUN 15 05/25/2020 0738   BUN 13 05/25/2014 0940   CREATININE 1.10 08/20/2021 0711   CREATININE 1.19 05/25/2014 0940   CALCIUM 9.1 08/20/2021 0711   CALCIUM 8.4 (L) 05/25/2014 0940   PROT 7.2 06/29/2021 0723   ALBUMIN 4.6 06/29/2021 0723   AST 15 06/29/2021 0723   ALT 22 06/29/2021 0723   ALKPHOS 84 06/29/2021 0723   BILITOT 0.3 06/29/2021 0723   GFRNONAA >60 08/20/2021 0711   GFRNONAA >60 05/25/2014 0940   GFRAA 84 05/25/2020 0738   GFRAA >60 05/25/2014 0940     Assessment and Plan: 1. Secondary erythrocytosis   2. History of MI (myocardial infarction)   3. Long-term current use of testosterone replacement therapy     Labs done at [facility] was reviewed and discussed with patient. 08/26/2021, hemoglobin 17.  Hematocrit 50.9. Proceed with phlebotomy 500 cc x 1. Patient may explore options of phlebotomy and will update [place].  Follow Up Instructions: 3 months.  [facility] CBC +/- phlebotomy. 6 months lab MD [facility] CBC +/- phlebotomy.   I discussed the assessment and treatment plan with the patient. The patient was provided an opportunity to ask questions  and all were answered. The patient agreed with the plan and demonstrated an understanding of the instructions.  The patient was advised to call back or seek an in-person evaluation if the symptoms worsen or if  the condition fails to improve as anticipated.   Earlie Server, MD 09/02/2021 8:36 PM

## 2021-09-03 ENCOUNTER — Inpatient Hospital Stay: Payer: Managed Care, Other (non HMO)

## 2021-09-03 VITALS — BP 144/74 | HR 65 | Temp 97.1°F | Resp 18

## 2021-09-03 DIAGNOSIS — Z79890 Hormone replacement therapy (postmenopausal): Secondary | ICD-10-CM | POA: Diagnosis not present

## 2021-09-03 DIAGNOSIS — D751 Secondary polycythemia: Secondary | ICD-10-CM

## 2021-09-03 NOTE — Progress Notes (Signed)
Therapeutic phlebotomy performed today; Removed 500 ml of blood per orders. Pt tolerated procedure well. VSS. Accepted a beverage. Discharged to home.

## 2021-09-03 NOTE — Patient Instructions (Signed)

## 2021-09-06 ENCOUNTER — Other Ambulatory Visit: Payer: Self-pay | Admitting: Interventional Cardiology

## 2021-11-15 ENCOUNTER — Encounter: Payer: Self-pay | Admitting: Interventional Cardiology

## 2021-11-29 ENCOUNTER — Encounter: Payer: Self-pay | Admitting: Oncology

## 2021-12-01 ENCOUNTER — Inpatient Hospital Stay: Payer: Managed Care, Other (non HMO) | Attending: Oncology

## 2021-12-01 VITALS — BP 148/85 | HR 66 | Temp 99.0°F | Resp 18

## 2021-12-01 DIAGNOSIS — D751 Secondary polycythemia: Secondary | ICD-10-CM | POA: Insufficient documentation

## 2021-12-01 NOTE — Progress Notes (Signed)
Therapeutic phlebotomy performed. HCT 50.2. Removed 500 ml of blood from right AC. Tolerated procedure well. Pt was offered a snack and drink. VSS. Pt feeling well. Discharged to home. ?

## 2021-12-01 NOTE — Patient Instructions (Signed)

## 2021-12-19 ENCOUNTER — Other Ambulatory Visit: Payer: Self-pay | Admitting: Family Medicine

## 2021-12-19 DIAGNOSIS — E785 Hyperlipidemia, unspecified: Secondary | ICD-10-CM

## 2021-12-19 DIAGNOSIS — M109 Gout, unspecified: Secondary | ICD-10-CM

## 2021-12-20 ENCOUNTER — Other Ambulatory Visit (INDEPENDENT_AMBULATORY_CARE_PROVIDER_SITE_OTHER): Payer: Managed Care, Other (non HMO)

## 2021-12-20 DIAGNOSIS — E785 Hyperlipidemia, unspecified: Secondary | ICD-10-CM | POA: Diagnosis not present

## 2021-12-20 DIAGNOSIS — M109 Gout, unspecified: Secondary | ICD-10-CM

## 2021-12-20 NOTE — Addendum Note (Signed)
Addended by: Ellamae Sia on: 12/20/2021 07:28 AM   Modules accepted: Orders

## 2021-12-21 LAB — COMPREHENSIVE METABOLIC PANEL
ALT: 37 IU/L (ref 0–44)
AST: 26 IU/L (ref 0–40)
Albumin/Globulin Ratio: 1.8 (ref 1.2–2.2)
Albumin: 4.6 g/dL (ref 3.8–4.9)
Alkaline Phosphatase: 85 IU/L (ref 44–121)
BUN/Creatinine Ratio: 9 — ABNORMAL LOW (ref 10–24)
BUN: 10 mg/dL (ref 8–27)
Bilirubin Total: 0.4 mg/dL (ref 0.0–1.2)
CO2: 26 mmol/L (ref 20–29)
Calcium: 9.7 mg/dL (ref 8.6–10.2)
Chloride: 99 mmol/L (ref 96–106)
Creatinine, Ser: 1.12 mg/dL (ref 0.76–1.27)
Globulin, Total: 2.6 g/dL (ref 1.5–4.5)
Glucose: 100 mg/dL — ABNORMAL HIGH (ref 70–99)
Potassium: 4.5 mmol/L (ref 3.5–5.2)
Sodium: 140 mmol/L (ref 134–144)
Total Protein: 7.2 g/dL (ref 6.0–8.5)
eGFR: 75 mL/min/{1.73_m2} (ref >59)

## 2021-12-21 LAB — LIPID PANEL
Chol/HDL Ratio: 2.8 ratio (ref 0.0–5.0)
Cholesterol, Total: 117 mg/dL (ref 100–199)
HDL: 42 mg/dL (ref >39)
LDL Chol Calc (NIH): 62 mg/dL (ref 0–99)
Triglycerides: 56 mg/dL (ref 0–149)
VLDL Cholesterol Cal: 13 mg/dL (ref 5–40)

## 2021-12-21 LAB — URIC ACID: Uric Acid: 6.5 mg/dL (ref 3.8–8.4)

## 2021-12-22 ENCOUNTER — Encounter: Payer: Self-pay | Admitting: Interventional Cardiology

## 2021-12-22 DIAGNOSIS — I10 Essential (primary) hypertension: Secondary | ICD-10-CM

## 2021-12-23 NOTE — Telephone Encounter (Signed)
Talked to patient about the sildenafil. He is still taking it and stated he cannot stop.

## 2021-12-27 ENCOUNTER — Other Ambulatory Visit: Payer: Self-pay

## 2021-12-27 ENCOUNTER — Ambulatory Visit (INDEPENDENT_AMBULATORY_CARE_PROVIDER_SITE_OTHER): Payer: Managed Care, Other (non HMO) | Admitting: Family Medicine

## 2021-12-27 ENCOUNTER — Encounter: Payer: Self-pay | Admitting: Family Medicine

## 2021-12-27 VITALS — BP 140/82 | HR 65 | Temp 98.2°F | Ht 67.0 in | Wt 226.0 lb

## 2021-12-27 DIAGNOSIS — I10 Essential (primary) hypertension: Secondary | ICD-10-CM

## 2021-12-27 DIAGNOSIS — R202 Paresthesia of skin: Secondary | ICD-10-CM

## 2021-12-27 DIAGNOSIS — E291 Testicular hypofunction: Secondary | ICD-10-CM

## 2021-12-27 DIAGNOSIS — Z23 Encounter for immunization: Secondary | ICD-10-CM | POA: Diagnosis not present

## 2021-12-27 DIAGNOSIS — E785 Hyperlipidemia, unspecified: Secondary | ICD-10-CM

## 2021-12-27 DIAGNOSIS — Z0000 Encounter for general adult medical examination without abnormal findings: Secondary | ICD-10-CM | POA: Diagnosis not present

## 2021-12-27 DIAGNOSIS — Z7189 Other specified counseling: Secondary | ICD-10-CM

## 2021-12-27 MED ORDER — VALSARTAN-HYDROCHLOROTHIAZIDE 320-12.5 MG PO TABS: 1.0000 | ORAL_TABLET | Freq: Every day | ORAL | Status: DC

## 2021-12-27 MED ORDER — LISINOPRIL-HYDROCHLOROTHIAZIDE 20-25 MG PO TABS: 1.0000 | ORAL_TABLET | Freq: Every day | ORAL | 3 refills | Status: DC

## 2021-12-27 NOTE — Progress Notes (Signed)
CPE- See plan.  Routine anticipatory guidance given to patient.  See health maintenance.  The possibility exists that previously documented standard health maintenance information may have been brought forward from a previous encounter into this note.  If needed, that same information has been updated to reflect the current situation based on today's encounter.    Tetanus 2023 Flu done yearly  Shingles d/w pt.   PNA not due covid vaccine 2021 D/w patient [id number] for colon cancer screening, including IFOB vs. colonoscopy.  Risks and benefits of both were discussed and patient voiced understanding.  Pt elects for: IFOB.  he did one at [facility] as an employee 06/2021 PSA per urology.   Living will d/w pt.  Wife is designated if he is incapacitated.   Diet and exercise d/w pt.  biking on a peleton/rowing/treadmill.   HIV prev neg 2002.    Pt opted out for HCV screening.  D/w pt re: routine screening.    No recent gout flares.     No recent asthma sx except for with recent noted dec in air quality.  No recent inhaler use of any variety.     He is still on T replacement with phlebotomy every 3 months.   Labs d/w pt.    Hypertension:               Using medication without problems or lightheadedness: yes Chest pain with exertion:no Edema:trace BLE edema Short of breath:no 140/82 on home check this AM.  He has a history of lower BPs at home.   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  D/w pt about not using nitrate with sildenafil (ie not using BiDil).  D/w pt that I would check with cardiology in the meantime.    He has paresthesias up to the bilateral mid shin.  He had prev EMG done.  No motor changes.     Elevated Cholesterol: Using medications without problems: yes Muscle aches: some foot cramping that could be neuropathy related.   Diet compliance: yes Exercise: yes  Prev hemorrhoid issue resolved and he is paying attention to  diet.     Work situation d/w pt.    PMH and SH reviewed  Meds, vitals, and allergies reviewed.   ROS: Per HPI.  Unless specifically indicated otherwise in HPI, the patient denies:  General: fever. Eyes: acute vision changes ENT: sore throat Cardiovascular: chest pain Respiratory: SOB GI: vomiting GU: dysuria Musculoskeletal: acute back pain Derm: acute rash Neuro: acute motor dysfunction Psych: worsening mood Endocrine: polydipsia Heme: bleeding Allergy: hayfever  GEN: nad, alert and oriented HEENT: ncat NECK: supple w/o LA CV: rrr. PULM: ctab, no inc wob ABD: soft, +bs EXT: no edema SKIN: no acute rash  Normal B DP pulse, normal cap refill.  Altered but not absent monofilament testing on the B shins.  No gross weakness on extremity testing x4

## 2021-12-27 NOTE — Patient Instructions (Signed)
Take care.  Glad to see you.  Tetanus shot today.  Let me check with Dr. Tamala Julian in the meantime.  Hydralazine may be an option instead of diovan HCTZ.  Depending on that outcome, we can refer to a [facility] if needed.

## 2021-12-27 NOTE — Telephone Encounter (Signed)
Per Dr. Tamala Julian, Disregard the prior recommendations.  Start lisinopril HCTZ 20/25 mg daily.  Discontinued Diovan HCT Z..  K. Dur 20 mEq/day.  Basic metabolic panel 1 week later.  Discontinue the order for BiDil and HCTZ  Informed patient of changes. Patient will start lisinopril HCTZ 20/25 mg daily and discontinue Diovan/HCTZ. Patient will go to [facility] for repeat BMET in one week.

## 2021-12-30 NOTE — Assessment & Plan Note (Signed)
He is still on T replacement with phlebotomy every 3 months.   Labs d/w pt.

## 2021-12-30 NOTE — Assessment & Plan Note (Signed)
140/82 on home check this AM.  He has a history of lower BPs at home.   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  D/w pt about not using nitrate with sildenafil (ie not using BiDil).  D/w pt that I would check with cardiology in the meantime.    Addendum-there is a follow-up note from cardiology about changing to lisinopril hydrochlorothiazide and I will defer.  I appreciate cardiology help.

## 2021-12-30 NOTE — Assessment & Plan Note (Signed)
Living will d/w pt.  Wife is designated if he is incapacitated.

## 2021-12-30 NOTE — Assessment & Plan Note (Signed)
We can refer her to tertiary care hospital if he does not see improvement with cessation of Diovan.

## 2021-12-30 NOTE — Assessment & Plan Note (Signed)
Continue Crestor 

## 2021-12-30 NOTE — Assessment & Plan Note (Signed)
Tetanus 2023 Flu done yearly  Shingles d/w pt.   PNA not due covid vaccine 2021 D/w patient [id number] for colon cancer screening, including IFOB vs. colonoscopy.  Risks and benefits of both were discussed and patient voiced understanding.  Pt elects for: IFOB.  he did one at [facility] as an employee 06/2021 PSA per urology.   Living will d/w pt.  Wife is designated if he is incapacitated.   Diet and exercise d/w pt.  biking on a peleton/rowing/treadmill.   HIV prev neg 2002.    Pt opted out for HCV screening.  D/w pt re: routine screening.

## 2022-01-03 ENCOUNTER — Other Ambulatory Visit: Payer: Self-pay | Admitting: Interventional Cardiology

## 2022-01-05 ENCOUNTER — Encounter: Payer: Self-pay | Admitting: Oncology

## 2022-01-13 LAB — BASIC METABOLIC PANEL
BUN/Creatinine Ratio: 13 (ref 10–24)
BUN: 14 mg/dL (ref 8–27)
CO2: 25 mmol/L (ref 20–29)
Calcium: 9.9 mg/dL (ref 8.6–10.2)
Chloride: 98 mmol/L (ref 96–106)
Creatinine, Ser: 1.07 mg/dL (ref 0.76–1.27)
Glucose: 100 mg/dL — ABNORMAL HIGH (ref 70–99)
Potassium: 4.5 mmol/L (ref 3.5–5.2)
Sodium: 139 mmol/L (ref 134–144)
eGFR: 79 mL/min/{1.73_m2} (ref >59)

## 2022-01-20 ENCOUNTER — Other Ambulatory Visit: Payer: Self-pay

## 2022-01-20 MED ORDER — LISINOPRIL-HYDROCHLOROTHIAZIDE 20-25 MG PO TABS: 1.0000 | ORAL_TABLET | Freq: Every day | ORAL | 1 refills | Status: DC

## 2022-03-03 ENCOUNTER — Ambulatory Visit: Payer: Managed Care, Other (non HMO) | Admitting: Oncology

## 2022-03-08 ENCOUNTER — Encounter: Payer: Self-pay | Admitting: Oncology

## 2022-03-11 ENCOUNTER — Encounter: Payer: Self-pay | Admitting: Oncology

## 2022-03-11 ENCOUNTER — Inpatient Hospital Stay: Payer: Managed Care, Other (non HMO)

## 2022-03-11 ENCOUNTER — Inpatient Hospital Stay: Payer: Managed Care, Other (non HMO) | Attending: Oncology | Admitting: Oncology

## 2022-03-11 VITALS — BP 141/74 | HR 73 | Temp 97.0°F | Resp 18 | Wt 228.3 lb

## 2022-03-11 VITALS — BP 136/80 | HR 73

## 2022-03-11 DIAGNOSIS — E291 Testicular hypofunction: Secondary | ICD-10-CM | POA: Diagnosis not present

## 2022-03-11 DIAGNOSIS — D751 Secondary polycythemia: Secondary | ICD-10-CM | POA: Diagnosis present

## 2022-03-11 DIAGNOSIS — Z79890 Hormone replacement therapy (postmenopausal): Secondary | ICD-10-CM

## 2022-03-11 DIAGNOSIS — Z79899 Other long term (current) drug therapy: Secondary | ICD-10-CM | POA: Diagnosis not present

## 2022-03-11 DIAGNOSIS — I252 Old myocardial infarction: Secondary | ICD-10-CM | POA: Diagnosis not present

## 2022-03-11 DIAGNOSIS — Z7982 Long term (current) use of aspirin: Secondary | ICD-10-CM | POA: Diagnosis not present

## 2022-03-11 DIAGNOSIS — Z87891 Personal history of nicotine dependence: Secondary | ICD-10-CM | POA: Diagnosis not present

## 2022-03-11 NOTE — Progress Notes (Signed)
500 ml of blood removed per Phlebotomy parameters. HCT 52.2. Pt tolerated procedure well. Accepted a beverage. VSS. Feeling well. Discharged to home.

## 2022-03-11 NOTE — Patient Instructions (Signed)

## 2022-03-12 ENCOUNTER — Encounter: Payer: Self-pay | Admitting: Oncology

## 2022-03-12 NOTE — Progress Notes (Signed)
Hematology/Oncology Progress note [phone] [phone]      Patient Care Team: Tonia Ghent, MD as PCP - General (Family Medicine) Belva Crome, MD as PCP - Cardiology (Cardiology) Pa, [facility] (Urology) Festus Aloe, MD as Consulting Physician (Urology)  REFERRING PROVIDER: Tonia Ghent, MD  CHIEF COMPLAINTS/REASON FOR VISIT:  Follow-up for secondary erythrocytosis  HISTORY OF PRESENTING ILLNESS:  Stephen [name] is a 61 y.o. male who was seen in consultation at the request of Damita Dunnings, Elveria Rising, MD for evaluation of polycytosis/erythrocytosis Patient does not have recent blood work in current EMR. Reviewed previous blood work that was scanned into EMR. 10/15/2018 hemoglobin 17.4, hematocrit 51.7 12/12/2018 hemoglobin 17.8, hematocrit 50.5. 01/04/2019 hemoglobin 17.2, hematocrit 48  Patient has been well testosterone supplementation for treatment of primary hypogonadism since 2015.  [facility].  Currently on testosterone 150 mg injection every 2 weeks Reports previous history of myocardial infarction, in 2060  Associated signs or symptoms: Denies weight loss, fever, chills, fatigue, night sweats.   Context:  Smoking history: Denies.  Former smoker. Testosterone supplements: See above. History of blood clots: Denies Daytime somnolence: Denies Family history of polycythemia: Father with erythrocytosis and need phlebotomy.  Per patient, father probably also had secondary erythrocytosis.  Details not clear.  Patient has had phlebotomy previously, obtained by urologist.  Reports that recently he had had a phlebotomy a few weeks ago.  1 week after phlebotomy, his hemoglobin was around 17.  I do not have his most recent blood work.  INTERVAL HISTORY Stephen [name] is a 61 y.o. male who has above history reviewed by me today presents for follow up visit for management of erythrocytosis Problems and  complaints are listed below: On testosterone replacement.  No new complaints.    Review of Systems  Constitutional:  Negative for appetite change, chills, fatigue, fever and unexpected weight change.  HENT:   Negative for hearing loss and voice change.   Eyes:  Negative for eye problems and icterus.  Respiratory:  Negative for chest tightness, cough and shortness of breath.   Cardiovascular:  Negative for chest pain and leg swelling.  Gastrointestinal:  Negative for abdominal distention and abdominal pain.  Endocrine: Negative for hot flashes.  Genitourinary:  Negative for difficulty urinating, dysuria and frequency.   Musculoskeletal:  Negative for arthralgias.  Skin:  Negative for itching and rash.  Neurological:  Positive for numbness. Negative for light-headedness.  Hematological:  Negative for adenopathy. Does not bruise/bleed easily.  Psychiatric/Behavioral:  Negative for confusion.     MEDICAL HISTORY:  Past Medical History:  Diagnosis Date   Achilles tendon injury    right   Arthritis    back   Asthma    as a child   CAD S/P percutaneous coronary angioplasty 05/25/2014   ED (erectile dysfunction)    Elevated glucose    Erythrocytosis    SEES [facility] Macio Kissoon [facility] [place] [facility]   GERD (gastroesophageal reflux disease)    Gout    Hyperlipidemia    Hypertension    Morton's neuroma    2014   Neuropathy    Seasonal allergies    uses advair in May   ST elevation myocardial infarction (STEMI) of anterior wall ([place]) 05/25/14   1 stent   Tobacco abuse, + cigars 05/27/2014    SURGICAL HISTORY: Past Surgical History:  Procedure Laterality Date   CORONARY ANGIOPLASTY WITH STENT PLACEMENT  05/25/14   Resolute integrity 3.5 X 18 mm DES  3.75 mm   Fx L forearm closed fx  as a child   Hospital / asthma     multiple times as child   LEFT HEART CATHETERIZATION WITH CORONARY ANGIOGRAM N/A 05/25/2014   Procedure: LEFT HEART CATHETERIZATION WITH CORONARY ANGIOGRAM;  Surgeon: Sinclair Grooms,  MD;  Location: [facility] CATH LAB;  Service: Cardiovascular;  Laterality: N/A;   PERCUTANEOUS CORONARY STENT INTERVENTION (PCI-S)  05/25/2014   Procedure: PERCUTANEOUS CORONARY STENT INTERVENTION (PCI-S);  Surgeon: Sinclair Grooms, MD;  Location: [facility] CATH LAB;  Service: Cardiovascular;;  PROX LAD     SOCIAL HISTORY: Social History   Socioeconomic History   Marital status: Married    Spouse name: Not on file   Number of children: 1   Years of education: Not on file   Highest education level: Not on file  Occupational History   Occupation: [employer]    Comment: supervisor of special chemistries  Tobacco Use   Smoking status: Former    Types: Cigars    Quit date: 05/25/2014    Years since quitting: 7.8   Smokeless tobacco: Never  Substance and Sexual Activity   Alcohol use: Not Currently   Drug use: No   Sexual activity: Yes  Other Topics Concern   Not on file  Social History Narrative   One son, 1 stepson   Married 2001   From [place]   Works for [employer].    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    Social Determinants of Health   Financial Resource Strain: Not on file  Food Insecurity: Not on file  Transportation Needs: Not on file  Physical Activity: Not on file  Stress: Not on file  Social Connections: Not on file  Intimate Partner Violence: Not on file    FAMILY HISTORY: Family History  Problem Relation Age of Onset   Stroke Mother    Hypertension Father    Dementia Father    Stroke Father 75       Multiple   AAA (abdominal aortic aneurysm) Father    Hyperlipidemia Sister    Hypertension Sister    Alcohol abuse Brother        organ failure   Prostate cancer Neg Hx    Colon cancer Neg Hx     ALLERGIES:  is allergic to loratadine, claritin-d 12 hour [loratadine-pseudoephedrine er], eggs or egg-derived products, kiwi extract, penicillins, simvastatin, and gabapentin.  MEDICATIONS:  Current Outpatient Medications  Medication Sig  Dispense Refill   amLODipine (NORVASC) 5 MG tablet TAKE 1 TABLET BY MOUTH DAILY. 90 tablet 3   aspirin 81 MG chewable tablet Chew 1 tablet (81 mg total) by mouth daily.     carvedilol (COREG) 12.5 MG tablet TAKE 1 TABLET BY MOUTH  TWICE DAILY 180 tablet 2   Coenzyme Q10 (CO Q 10) 100 MG CAPS Take 100 mg by mouth daily.     colchicine 0.6 MG tablet Take 1 tablet (0.6 mg total) by mouth daily as needed. 90 tablet 0   Ketotifen Fumarate (ALAWAY OP) Place 1 drop into both eyes daily as needed (seasonal allergies).     lidocaine (LIDODERM) 5 % Place 1 patch onto the skin every 12 (twelve) hours. Remove & Discard patch within 12 hours or as directed by MD 10 patch 0   Lidocaine 3 % CREA Apply 1 application  topically 2 (two) times daily.     rosuvastatin (CRESTOR) 20 MG tablet Take 1 tablet (  20 mg total) by mouth at bedtime. 90 tablet 3   sildenafil (REVATIO) 20 MG tablet Take 20 mg by mouth See admin instructions. Take 20 mg to 100 mg by mouth daily as needed for erectile dysfunction.     testosterone cypionate (DEPOTESTOSTERONE CYPIONATE) 200 MG/ML injection Inject 0.75 mLs (150 mg total) into the muscle every 14 (fourteen) days.     valsartan (DIOVAN) 320 MG tablet Take 320 mg by mouth daily.     nitroGLYCERIN (NITROSTAT) 0.4 MG SL tablet Place 1 tablet (0.4 mg total) under the tongue every 5 (five) minutes as needed for chest pain. (Patient not taking: Reported on 03/11/2022) 25 tablet 3   No current facility-administered medications for this visit.     PHYSICAL EXAMINATION: ECOG PERFORMANCE STATUS: 0 - Asymptomatic Vitals:   03/11/22 1154  BP: (!) 141/74  Pulse: 73  Resp: 18  Temp: (!) 97 F (36.1 C)   Filed Weights   03/11/22 1154  Weight: 228 lb 4.8 oz (103.6 kg)    Physical Exam Constitutional:      General: He is not in acute distress. HENT:     Head: Normocephalic and atraumatic.  Eyes:     General: No scleral icterus.    Pupils: Pupils are equal, round, and reactive to  light.  Cardiovascular:     Rate and Rhythm: Normal rate and regular rhythm.     Heart sounds: Normal heart sounds.  Pulmonary:     Effort: Pulmonary effort is normal. No respiratory distress.     Breath sounds: No wheezing.  Abdominal:     General: Bowel sounds are normal. There is no distension.     Palpations: Abdomen is soft. There is no mass.     Tenderness: There is no abdominal tenderness.  Musculoskeletal:        General: No deformity. Normal range of motion.     Cervical back: Normal range of motion and neck supple.  Skin:    General: Skin is warm and dry.     Findings: No erythema or rash.  Neurological:     Mental Status: He is alert and oriented to person, place, and time. Mental status is at baseline.     Cranial Nerves: No cranial nerve deficit.     Coordination: Coordination normal.  Psychiatric:        Mood and Affect: Mood normal.        Behavior: Behavior normal.        Thought Content: Thought content normal.     RADIOGRAPHIC STUDIES: I have personally reviewed the radiological images as listed and agreed with the findings in the report. No results found.   LABORATORY DATA:  I have reviewed the data as listed Lab Results  Component Value Date   WBC 7.4 08/20/2021   HGB 17.0 08/20/2021   HCT 50.5 08/20/2021   MCV 89.9 08/20/2021   PLT 243 08/20/2021   Recent Labs    06/29/21 0723 08/20/21 0711 12/20/21 0728 01/12/22 0848  NA  --  134* 140 139  K  --  3.6 4.5 4.5  CL  --  96* 99 98  CO2  --  '30 26 25  '$ GLUCOSE  --  153* 100* 100*  BUN  --  '14 10 14  '$ CREATININE  --  1.10 1.12 1.07  CALCIUM  --  9.1 9.7 9.9  GFRNONAA  --  >60  --   --   PROT 7.2  --  7.2  --  ALBUMIN 4.6  --  4.6  --   AST 15  --  26  --   ALT 22  --  37  --   ALKPHOS 84  --  85  --   BILITOT 0.3  --  0.4  --   BILIDIR 0.13  --   --   --      [facility] results 06/02/2020 Hb 17.2, Hct 50.7 08/25/2020 hemoglobin 17.1, hematocrit 52.2. 11/24/20 hemoglobin 16.9, hematocrit  49.9  ASSESSMENT & PLAN:  1. Secondary erythrocytosis   2. 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 Labs reviewed and discussed with patient. Hb 17.8, Hct 52.2 Hct is >50.  Given that he is on testosterone treatments.  Proceed with phlebotomy today. For future, patient would like to donate blood.  Recommend blood donation Q3 months, hold if Hb<13  #Hypogonadism in male, patient is on testosterone treatments.  Continue follow-up with urology.   Follow-up in 6 months. We spent sufficient time to discuss many aspect of care, questions were answered to patient's satisfaction. The patient knows to call the clinic with any problems questions or concerns.  Earlie Server, MD, PhD 03/12/2022

## 2022-03-14 ENCOUNTER — Encounter: Payer: Self-pay | Admitting: Interventional Cardiology

## 2022-03-22 MED ORDER — VALSARTAN-HYDROCHLOROTHIAZIDE 320-12.5 MG PO TABS: 1.0000 | ORAL_TABLET | Freq: Every day | ORAL | 3 refills | Status: DC

## 2022-03-22 NOTE — Addendum Note (Signed)
Addended by: Molli Barrows on: 03/22/2022 08:21 AM   Modules accepted: Orders

## 2022-04-21 ENCOUNTER — Other Ambulatory Visit: Payer: Self-pay | Admitting: Interventional Cardiology

## 2022-05-30 ENCOUNTER — Other Ambulatory Visit: Payer: Self-pay | Admitting: Interventional Cardiology

## 2022-06-03 NOTE — Progress Notes (Unsigned)
Office Visit    Patient Name: Stephen [name] Date of Encounter: 06/06/2022  PCP:  Tonia Ghent, MD   [place]  Cardiologist:  Sinclair Grooms, MD  Advanced Practice Provider:  No care team member to display Electrophysiologist:  None   HPI    Stephen [name] is a 61 y.o. male with a past medical history significant for anterior ST elevation myocardial infarction treated with LAD drug-eluting stents 05/2014, post MI LVEF 60%, hypertension, hyperlipidemia, prior tobacco abuse, and erectile dysfunction presents today for follow-up visit.  He was last seen in the clinic by Dr. Tamala Julian 06/02/2021 and was doing okay at that time.  He was having some difficulty with lower extremity paresthesias.  He has neuropathy and was concerned that the statin was causing this problem.  His statin was stopped and his paresthesias did not improve.  He was seeing a neurologist and received a diagnosis of peripheral neuropathy.   Today, he is doing well without any chest pain or shortness of breath.  He was put on gabapentin by his primary for his peripheral neuropathy but was discontinued it since he felt like it was making his thoughts clouded.  He is currently not taking any medication for it.  He states that he ices his legs after working all day.  He is remaining active doing some walking as well as rowing and using his Peloton bike at home.  His cholesterol has been managed by his PCP and his most recent LDL was 62.  He stopped his valsartan for a time thinking about his neuropathy could be due to that medication but I did not make a difference so he is now back on his valsartan.  Blood pressure is well controlled today.  Reports no shortness of breath nor dyspnea on exertion. Reports no chest pain, pressure, or tightness. No edema, orthopnea, PND. Reports no palpitations.    Past Medical History    Past Medical History:  Diagnosis Date   Achilles tendon injury     right   Arthritis    back   Asthma    as a child   CAD S/P percutaneous coronary angioplasty 05/25/2014   ED (erectile dysfunction)    Elevated glucose    Erythrocytosis    SEES DR YU [facility] [place] [facility]   GERD (gastroesophageal reflux disease)    Gout    Hyperlipidemia    Hypertension    Morton's neuroma    2014   Neuropathy    Seasonal allergies    uses advair in May   ST elevation myocardial infarction (STEMI) of anterior wall ([place]) 05/25/14   1 stent   Tobacco abuse, + cigars 05/27/2014   Past Surgical History:  Procedure Laterality Date   CORONARY ANGIOPLASTY WITH STENT PLACEMENT  05/25/14   Resolute integrity 3.5 X 18 mm DES 3.75 mm   Fx L forearm closed fx  as a child   Hospital / asthma     multiple times as child   LEFT HEART CATHETERIZATION WITH CORONARY ANGIOGRAM N/A 05/25/2014   Procedure: LEFT HEART CATHETERIZATION WITH CORONARY ANGIOGRAM;  Surgeon: Sinclair Grooms, MD;  [facility]: [facility] CATH LAB;  Service: Cardiovascular;  Laterality: N/A;   PERCUTANEOUS CORONARY STENT INTERVENTION (PCI-S)  05/25/2014   Procedure: PERCUTANEOUS CORONARY STENT INTERVENTION (PCI-S);  Surgeon: Sinclair Grooms, MD;  Location: [facility] CATH LAB;  Service: Cardiovascular;;  PROX LAD     Allergies  Allergies  Allergen Reactions  Loratadine Anxiety   Claritin-D 12 Hour [Loratadine-Pseudoephedrine Er] Other (See Comments)    shaky   Eggs Or Egg-Derived Products Hives and Itching   Kiwi Extract Other (See Comments)    Throat irritation, swelling   Penicillins Other (See Comments)    Unknown childhood allergic reaction   Simvastatin Other (See Comments)    Numbness in toes, raised A1C, affected memory, fatigue   Gabapentin Other (See Comments)    sedation    EKGs/Labs/Other Studies Reviewed:   The following studies were reviewed today:  Lexiscan Myoview 06/01/2015  Nuclear stress EF: 59%. There was no ST segment deviation noted during stress. Defect 1: There is a fixed small defect of  moderate severity present in the apical lateral and apex location. No ischemia is noted. The left ventricular ejection fraction is normal (55-65%). This is a low risk study.  EKG:  EKG is  ordered today.  The ekg ordered today demonstrates normal sinus rhythm, rate 73 bpm  Recent Labs: 08/20/2021: Hemoglobin 17.0; Platelets 243 12/20/2021: ALT 37 01/12/2022: BUN 14; Creatinine, Ser 1.07; Potassium 4.5; Sodium 139  Recent Lipid Panel    Component Value Date/Time   CHOL 117 12/20/2021 0728   TRIG 56 12/20/2021 0728   HDL 42 12/20/2021 0728   CHOLHDL 2.8 12/20/2021 0728   CHOLHDL 3 07/28/2014 0903   VLDL 24.2 07/28/2014 0903   LDLCALC 62 12/20/2021 0728    Home Medications   Current Meds  Medication Sig   aspirin 81 MG chewable tablet Chew 1 tablet (81 mg total) by mouth daily.   carvedilol (COREG) 12.5 MG tablet TAKE 1 TABLET BY MOUTH  TWICE DAILY   Coenzyme Q10 (CO Q 10) 100 MG CAPS Take 100 mg by mouth daily.   colchicine 0.6 MG tablet Take 1 tablet (0.6 mg total) by mouth daily as needed.   Ketotifen Fumarate (ALAWAY OP) Place 1 drop into both eyes daily as needed (seasonal allergies).   lidocaine (LIDODERM) 5 % Place 1 patch onto the skin every 12 (twelve) hours. Remove & Discard patch within 12 hours or as directed by MD   Lidocaine 3 % CREA Apply 1 application  topically 2 (two) times daily.   nitroGLYCERIN (NITROSTAT) 0.4 MG SL tablet Place 1 tablet (0.4 mg total) under the tongue every 5 (five) minutes as needed for chest pain.   rosuvastatin (CRESTOR) 20 MG tablet TAKE 1 TABLET BY MOUTH AT  BEDTIME   sildenafil (REVATIO) 20 MG tablet Take 20 mg by mouth See admin instructions. Take 20 mg to 100 mg by mouth daily as needed for erectile dysfunction.   testosterone cypionate (DEPOTESTOSTERONE CYPIONATE) 200 MG/ML injection Inject 0.75 mLs (150 mg total) into the muscle every 14 (fourteen) days.   valsartan-hydrochlorothiazide (DIOVAN HCT) 320-12.5 MG tablet Take 1 tablet by mouth  daily.   [DISCONTINUED] amLODipine (NORVASC) 5 MG tablet TAKE 1 TABLET BY MOUTH DAILY.     Review of Systems      All other systems reviewed and are otherwise negative except as noted above.  Physical Exam    VS:  BP 126/82   Pulse 73   Ht '5\' 7"'$  (1.702 m)   Wt 225 lb 3.2 oz (102.2 kg)   SpO2 95%   BMI 35.27 kg/m  , BMI Body mass index is 35.27 kg/m.  Wt Readings from Last 3 Encounters:  06/06/22 225 lb 3.2 oz (102.2 kg)  03/11/22 228 lb 4.8 oz (103.6 kg)  12/27/21 226 lb (102.5 kg)  GEN: Well nourished, well developed, in no acute distress. HEENT: normal. Neck: Supple, no JVD, carotid bruits, or masses. Cardiac: RRR, no murmurs, rubs, or gallops. No clubbing, cyanosis, edema.  Radials/PT 2+ and equal bilaterally.  Respiratory:  Respirations regular and unlabored, clear to auscultation bilaterally. GI: Soft, nontender, nondistended. MS: No deformity or atrophy. Skin: Warm and dry, no rash. Neuro:  Strength and sensation are intact. Psych: Normal affect.  Assessment & Plan    CAD -no chest, no SOB -continue current medication regimen including Norvasc 5 mg daily, aspirin 81 mg daily, Coreg 12.5 mg twice daily, nitroglycerin as needed, Crestor 20 mg daily, Diovan/HCT 320-12.5 mg daily  Hyperlipidemia -Last LDL 62 (12/20/2021) -We will be due for repeat lipid panel 12/2022 per PCP -Continue current medication regimen with Crestor 20 mg daily  Essential hypertension -Well-controlled today, 126/82 -Continue current medication regimen -He did mention that his blood pressure drops with the sildenafil use, cautioned to watch this closely  Severe obesity with comorbidity  -doing some walking, he has a rowing machine and palatine bike at home that he uses -Encouraged low-sodium, heart healthy diet and daily exercise       Disposition: Follow up 1 year with Sinclair Grooms, MD or APP.  Signed, Elgie Collard, PA-C 06/06/2022, 8:43 AM [place]

## 2022-06-06 ENCOUNTER — Encounter: Payer: Self-pay | Admitting: Physician Assistant

## 2022-06-06 ENCOUNTER — Ambulatory Visit: Payer: Managed Care, Other (non HMO) | Attending: Physician Assistant | Admitting: Physician Assistant

## 2022-06-06 VITALS — BP 126/82 | HR 73 | Ht 67.0 in | Wt 225.2 lb

## 2022-06-06 DIAGNOSIS — E785 Hyperlipidemia, unspecified: Secondary | ICD-10-CM | POA: Diagnosis not present

## 2022-06-06 DIAGNOSIS — I10 Essential (primary) hypertension: Secondary | ICD-10-CM | POA: Diagnosis not present

## 2022-06-06 DIAGNOSIS — I2510 Atherosclerotic heart disease of native coronary artery without angina pectoris: Secondary | ICD-10-CM

## 2022-06-06 MED ORDER — AMLODIPINE BESYLATE 5 MG PO TABS: 5.0000 mg | ORAL_TABLET | Freq: Every day | ORAL | 3 refills | Status: DC

## 2022-06-06 NOTE — Patient Instructions (Signed)
Medication Instructions:  Your physician recommends that you continue on your current medications as directed. Please refer to the Current Medication list given to you today.  *If you need a refill on your cardiac medications before your next appointment, please call your pharmacy*   Lab Work: None If you have labs (blood work) drawn today and your tests are completely normal, you will receive your results only by: [place] (if you have MyChart) OR A paper copy in the mail If you have any lab test that is abnormal or we need to change your treatment, we will call you to review the results.   Follow-Up: At [facility], you and your health needs are our priority.  As part of our continuing mission to provide you with exceptional heart care, we have created designated Provider Care Teams.  These Care Teams include your primary Cardiologist (physician) and Advanced Practice Providers (APPs -  Physician Assistants and Nurse Practitioners) who all work together to provide you with the care you need, when you need it.   Your next appointment:   1 year(s)  Important Information About Sugar

## 2022-06-27 ENCOUNTER — Encounter: Payer: Self-pay | Admitting: Interventional Cardiology

## 2022-07-03 ENCOUNTER — Encounter: Payer: Self-pay | Admitting: Interventional Cardiology

## 2022-08-11 ENCOUNTER — Encounter: Payer: Self-pay | Admitting: Family Medicine

## 2022-08-14 ENCOUNTER — Other Ambulatory Visit: Payer: Self-pay | Admitting: Family Medicine

## 2022-08-14 DIAGNOSIS — R202 Paresthesia of skin: Secondary | ICD-10-CM

## 2022-08-14 DIAGNOSIS — G629 Polyneuropathy, unspecified: Secondary | ICD-10-CM

## 2022-08-18 ENCOUNTER — Other Ambulatory Visit: Payer: Self-pay

## 2022-08-18 MED ORDER — ROSUVASTATIN CALCIUM 20 MG PO TABS: 20.0000 mg | ORAL_TABLET | Freq: Every day | ORAL | 3 refills | Status: DC

## 2022-08-24 ENCOUNTER — Encounter: Payer: Self-pay | Admitting: Oncology

## 2022-08-27 ENCOUNTER — Encounter: Payer: Self-pay | Admitting: Oncology

## 2022-08-29 ENCOUNTER — Telehealth: Payer: Self-pay

## 2022-08-29 NOTE — Telephone Encounter (Signed)
[facility] report scanned in media. Pt wanting to add a phleb this week. Also , he will be seeing you at the end of the month.

## 2022-08-29 NOTE — Telephone Encounter (Signed)
Please schedule and inform pt of appt:  Phleb one day this week  Keep appt on 2/27 as scheduled.

## 2022-08-29 NOTE — Telephone Encounter (Signed)
-----   Message from Stephens November sent at 08/29/2022 10:08 AM EST ----- Regarding: Phlemotomy Req PT called in to req Phlebotomy appt. He states that he is supposed to receive every 3 months. Per Last LOS: [facility] cbc 6 months prior to MD  Phlebtomoty today .Marland Kitchen Please advise

## 2022-08-30 ENCOUNTER — Inpatient Hospital Stay: Payer: Managed Care, Other (non HMO) | Attending: Oncology

## 2022-08-30 VITALS — BP 130/75 | HR 60

## 2022-08-30 DIAGNOSIS — D751 Secondary polycythemia: Secondary | ICD-10-CM | POA: Diagnosis present

## 2022-08-30 MED ORDER — SODIUM CHLORIDE 0.9 % IV SOLN
Freq: Once | INTRAVENOUS | Status: DC
  Filled 2022-08-30: qty 250

## 2022-09-05 ENCOUNTER — Other Ambulatory Visit: Payer: Self-pay | Admitting: *Deleted

## 2022-09-05 MED ORDER — CARVEDILOL 12.5 MG PO TABS: 12.5000 mg | ORAL_TABLET | Freq: Two times a day (BID) | ORAL | 2 refills | Status: DC

## 2022-09-13 ENCOUNTER — Inpatient Hospital Stay: Payer: Managed Care, Other (non HMO) | Admitting: Oncology

## 2022-09-13 ENCOUNTER — Encounter: Payer: Self-pay | Admitting: Oncology

## 2022-09-13 VITALS — BP 154/78 | HR 65 | Temp 96.6°F | Resp 18 | Wt 217.6 lb

## 2022-09-13 DIAGNOSIS — D751 Secondary polycythemia: Secondary | ICD-10-CM

## 2022-09-13 NOTE — Progress Notes (Signed)
Hematology/Oncology Progress note [phone] [phone]     CHIEF COMPLAINTS/REASON FOR VISIT:  Follow-up for secondary erythrocytosis  ASSESSMENT & PLAN:   Secondary erythrocytosis Recent labs were reviewed and discussed with patient.  He has had phlebotomy session. Patient is interested to get phlebotomy done at our facility. Recommend to repeat CBC in 3 months +/- phlebotomy Follow-up with me in 6 months with CBC, +/- phlebotomy.  No orders of the defined types were placed in this encounter.   All questions were answered. The patient knows to call the clinic with any problems, questions or concerns.  Earlie Server, MD, PhD [facility] Hematology Oncology 09/13/2022    HISTORY OF PRESENTING ILLNESS:  Stephen [name] is a 62 y.o. male who was seen in consultation at the request of Damita Dunnings, Elveria Rising, MD for evaluation of polycytosis/erythrocytosis Patient does not have recent blood work in current EMR. Reviewed previous blood work that was scanned into EMR. 10/15/2018 hemoglobin 17.4, hematocrit 51.7 12/12/2018 hemoglobin 17.8, hematocrit 50.5. 01/04/2019 hemoglobin 17.2, hematocrit 48  Patient has been well testosterone supplementation for treatment of primary hypogonadism since 2015.  [facility] with [facility].  Currently on testosterone 150 mg injection every 2 weeks Reports previous history of myocardial infarction, in 2060  Associated signs or symptoms: Denies weight loss, fever, chills, fatigue, night sweats.   Context:  Smoking history: Denies.  Former smoker. Testosterone supplements: See above. History of blood clots: Denies Daytime somnolence: Denies Family history of polycythemia: Father with erythrocytosis and need phlebotomy.  Per patient, father probably also had secondary erythrocytosis.  Details not clear.  Patient has had phlebotomy previously, obtained by urologist.  Reports that recently he had had a phlebotomy a few weeks ago.   1 week after phlebotomy, his hemoglobin was around 17.  I do not have his most recent blood work.  INTERVAL HISTORY Stephen [name] is a 62 y.o. male who has above history reviewed by me today presents for follow up visit for management of erythrocytosis Problems and complaints are listed below: On testosterone replacement.  Patient previously has phlebotomy done at blood bank.  He now is interested in getting phlebotomy done at our clinic.   Review of Systems  Constitutional:  Negative for appetite change, chills, fatigue, fever and unexpected weight change.  HENT:   Negative for hearing loss and voice change.   Eyes:  Negative for eye problems and icterus.  Respiratory:  Negative for chest tightness, cough and shortness of breath.   Cardiovascular:  Negative for chest pain and leg swelling.  Gastrointestinal:  Negative for abdominal distention and abdominal pain.  Endocrine: Negative for hot flashes.  Genitourinary:  Negative for difficulty urinating, dysuria and frequency.   Musculoskeletal:  Negative for arthralgias.  Skin:  Negative for itching and rash.  Neurological:  Positive for numbness. Negative for light-headedness.  Hematological:  Negative for adenopathy. Does not bruise/bleed easily.  Psychiatric/Behavioral:  Negative for confusion.     MEDICAL HISTORY:  Past Medical History:  Diagnosis Date   Achilles tendon injury    right   Arthritis    back   Asthma    as a child   CAD S/P percutaneous coronary angioplasty 05/25/2014   ED (erectile dysfunction)    Elevated glucose    Erythrocytosis    [facility]ES [facility] Shadr[facility] Brum[facility] [place] [facility]   GERD (gastroesophageal reflux disease)    Gout    Hyperlipidemia    Hypertension    Morton's neuroma    2014  Neuropathy    Seasonal allergies    uses advair in May   ST elevation myocardial infarction (STEMI) of anterior wall ([place]) 05/25/14   1 stent   Tobacco abuse, + cigars 05/27/2014    SURGICAL HISTORY: Past Surgical History:   Procedure Laterality Date   CORONARY ANGIOPLASTY WITH STENT PLACEMENT  05/25/14   Resolute integrity 3.5 X 18 mm DES 3.75 mm   Fx L forearm closed fx  as a child   Hospital / asthma     multiple times as child   LEFT HEART CATHETERIZATION WITH CORONARY ANGIOGRAM N/A 05/25/2014   Procedure: LEFT HEART CATHETERIZATION WITH CORONARY ANGIOGRAM;  Surgeon: Sinclair Grooms, MD;  Location[facility] C[facility] LAB;  Service: Cardiovascular;  Laterality: N/A;   PERCUTANEOUS CORONARY STENT INTERVENTION (PCI-S)  05/25/2014   Procedure: PERCUTANEOUS CORONARY STENT INTERVENTION (PCI-S);  Surgeon: Sinclair Grooms, MD;  Location: [facility] CATH LAB;  Service: Cardiovascular;;  PROX LAD     SOCIAL HISTORY: Social History   Socioeconomic History   Marital status: Married    Spouse name: Not on file   Number of children: 1   Years of education: Not on file   Highest education level: Not on file  Occupational History   Occupation: [employer]    Comment: supervisor of special chemistries  Tobacco Use   Smoking status: Former    Types: Cigars    Quit date: 05/25/2014    Years since quitting: 8.3   Smokeless tobacco: Never  Substance and Sexual Activity   Alcohol use: Not Currently   Drug use: No   Sexual activity: Yes  Other Topics Concern   Not on file  Social History Narrative   One son, 1 stepson   Married 2001   From [place]   Works for [employer].    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    Social Determinants of Health   Financial Resource Strain: Not on file  Food Insecurity: Not on file  Transportation Needs: Not on file  Physical Activity: Not on file  Stress: Not on file  Social Connections: Not on file  Intimate Partner Violence: Not on file    FAMILY HISTORY: Family History  Problem Relation Age of Onset   Stroke Mother    Hypertension Father    Dementia Father    Stroke Father 36       Multiple   AAA (abdominal aortic aneurysm) Father    Hyperlipidemia Sister     Hypertension Sister    Alcohol abuse Brother        organ failure   Prostate cancer Neg Hx    Colon cancer Neg Hx     ALLERGIES:  is allergic to loratadine, claritin-d 12 hour [loratadine-pseudoephedrine er], eggs or egg-derived products, kiwi extract, penicillins, simvastatin, and gabapentin.  MEDICATIONS:  Current Outpatient Medications  Medication Sig Dispense Refill   amLODipine (NORVASC) 5 MG tablet Take 1 tablet (5 mg total) by mouth daily. 90 tablet 3   aspirin 81 MG chewable tablet Chew 1 tablet (81 mg total) by mouth daily.     carvedilol (COREG) 12.5 MG tablet Take 1 tablet (12.5 mg total) by mouth 2 (two) times daily. 180 tablet 2   Coenzyme Q10 (CO Q 10) 100 MG CAPS Take 100 mg by mouth daily.     colchicine 0.6 MG tablet Take 1 tablet (0.6 mg total) by mouth daily as needed. 90 tablet 0   Ketotifen Fumarate (  ALAWAY OP) Place 1 drop into both eyes daily as needed (seasonal allergies).     Lidocaine 3 % CREA Apply 1 application  topically 2 (two) times daily.     nitroGLYCERIN (NITROSTAT) 0.4 MG SL tablet Place 1 tablet (0.4 mg total) under the tongue every 5 (five) minutes as needed for chest pain. 25 tablet 3   rosuvastatin (CRESTOR) 20 MG tablet Take 1 tablet (20 mg total) by mouth at bedtime. 90 tablet 3   sildenafil (REVATIO) 20 MG tablet Take 20 mg by mouth See admin instructions. Take 20 mg to 100 mg by mouth daily as needed for erectile dysfunction.     testosterone cypionate (DEPOTESTOSTERONE CYPIONATE) 200 MG/ML injection Inject 0.75 mLs (150 mg total) into the muscle every 14 (fourteen) days.     valsartan-hydrochlorothiazide (DIOVAN HCT) 320-12.5 MG tablet Take 1 tablet by mouth daily. 90 tablet 3   No current facility-administered medications for this visit.     PHYSICAL EXAMINATION: ECOG PERFORMANCE STATUS: 0 - Asymptomatic Vitals:   09/13/22 1315  BP: (!) 154/78  Pulse: 65  Resp: 18  Temp: (!) 96.6 F (35.9 C)  SpO2: 95%   Filed Weights   09/13/22  1315  Weight: 217 lb 9.6 oz (98.7 kg)    Physical Exam Constitutional:      General: He is not in acute distress. HENT:     Head: Normocephalic and atraumatic.  Eyes:     General: No scleral icterus.    Pupils: Pupils are equal, round, and reactive to light.  Cardiovascular:     Rate and Rhythm: Normal rate and regular rhythm.     Heart sounds: Normal heart sounds.  Pulmonary:     Effort: Pulmonary effort is normal. No respiratory distress.     Breath sounds: No wheezing.  Abdominal:     General: Bowel sounds are normal. There is no distension.     Palpations: Abdomen is soft. There is no mass.     Tenderness: There is no abdominal tenderness.  Musculoskeletal:        General: No deformity. Normal range of motion.     Cervical back: Normal range of motion and neck supple.  Skin:    General: Skin is warm and dry.     Findings: No erythema or rash.  Neurological:     Mental Status: He is alert and oriented to person, place, and time. Mental status is at baseline.     Cranial Nerves: No cranial nerve deficit.     Coordination: Coordination normal.  Psychiatric:        Mood and Affect: Mood normal.        Behavior: Behavior normal.        Thought Content: Thought content normal.     RADIOGRAPHIC STUDIES: I have personally reviewed the radiological images as listed and agreed with the findings in the report. No results found.   LABORATORY DATA:  I have reviewed the data as listed    Latest Ref Rng & Units 08/20/2021    7:11 AM 05/25/2020    7:38 AM 04/05/2018    8:23 AM  CBC  WBC 4.0 - 10.5 K/uL 7.4  7.0  6.6   Hemoglobin 13.0 - 17.0 g/dL 17.0  16.3  16.7   Hematocrit 39.0 - 52.0 % 50.5  49.2  48.4   Platelets 150 - 400 K/uL 243  259  263

## 2022-09-13 NOTE — Assessment & Plan Note (Signed)
Recent labs were reviewed and discussed with patient.  He has had phlebotomy session. Patient is interested to get phlebotomy done at our facility. Recommend to repeat CBC in 3 months +/- phlebotomy Follow-up with me in 6 months with CBC, +/- phlebotomy.

## 2022-09-20 ENCOUNTER — Telehealth: Payer: Self-pay

## 2022-09-20 DIAGNOSIS — E785 Hyperlipidemia, unspecified: Secondary | ICD-10-CM

## 2022-09-20 MED ORDER — EZETIMIBE 10 MG PO TABS: 10.0000 mg | ORAL_TABLET | Freq: Every day | ORAL | 2 refills | Status: DC

## 2022-09-20 NOTE — Telephone Encounter (Signed)
error 

## 2022-11-04 DIAGNOSIS — E785 Hyperlipidemia, unspecified: Secondary | ICD-10-CM

## 2022-11-04 DIAGNOSIS — I10 Essential (primary) hypertension: Secondary | ICD-10-CM

## 2022-11-16 ENCOUNTER — Other Ambulatory Visit: Payer: Self-pay

## 2022-11-16 ENCOUNTER — Telehealth: Payer: Self-pay | Admitting: Physician Assistant

## 2022-11-16 DIAGNOSIS — E785 Hyperlipidemia, unspecified: Secondary | ICD-10-CM

## 2022-11-16 LAB — LIPID PANEL
Chol/HDL Ratio: 5.5 ratio — ABNORMAL HIGH (ref 0.0–5.0)
Cholesterol, Total: 215 mg/dL — ABNORMAL HIGH (ref 100–199)
HDL: 39 mg/dL — ABNORMAL LOW (ref >39)
LDL Chol Calc (NIH): 153 mg/dL — ABNORMAL HIGH (ref 0–99)
Triglycerides: 128 mg/dL (ref 0–149)
VLDL Cholesterol Cal: 23 mg/dL (ref 5–40)

## 2022-11-16 NOTE — Telephone Encounter (Signed)
Patient aware orders has been placed and released.

## 2022-11-16 NOTE — Telephone Encounter (Signed)
Pt is requesting a callback in regards to a requisition for lipids. Please advise

## 2022-11-17 ENCOUNTER — Encounter: Payer: Self-pay | Admitting: Oncology

## 2022-11-18 NOTE — Addendum Note (Signed)
Addended by: Franchot Gallo on: 11/18/2022 08:24 AM   Modules accepted: Orders

## 2022-11-22 ENCOUNTER — Telehealth: Payer: Self-pay

## 2022-11-22 NOTE — Telephone Encounter (Signed)
Please cancel phleb on 5/28.

## 2022-11-22 NOTE — Telephone Encounter (Signed)
-----   Message from Rickard Patience, MD sent at 11/21/2022  8:24 PM EDT ----- My chart message sent.  No need for phlebotomy on 5/28, please cancel.

## 2022-12-07 ENCOUNTER — Ambulatory Visit: Payer: Managed Care, Other (non HMO) | Attending: Cardiovascular Disease | Admitting: Student

## 2022-12-07 ENCOUNTER — Other Ambulatory Visit (HOSPITAL_COMMUNITY): Payer: Self-pay

## 2022-12-07 ENCOUNTER — Encounter: Payer: Self-pay | Admitting: Oncology

## 2022-12-07 ENCOUNTER — Telehealth: Payer: Managed Care, Other (non HMO) | Admitting: Pharmacist

## 2022-12-07 ENCOUNTER — Encounter: Payer: Self-pay | Admitting: Student

## 2022-12-07 ENCOUNTER — Telehealth: Payer: Self-pay

## 2022-12-07 DIAGNOSIS — E785 Hyperlipidemia, unspecified: Secondary | ICD-10-CM

## 2022-12-07 MED ORDER — EZETIMIBE 10 MG PO TABS: 10.0000 mg | ORAL_TABLET | Freq: Every day | ORAL | 3 refills | Status: DC

## 2022-12-07 NOTE — Progress Notes (Signed)
Patient ID: [name]                 DOB: 15-Jan-1961                    MRN: [id number]      HPI: Stephen [name] is a 62 y.o. male patient referred to lipid clinic by Jari Favre, PA-C. PMH is significant for for anterior ST elevation myocardial infarction treated with LAD drug-eluting stents 05/2014, post MI LVEF 60%, hypertension, hyperlipidemia, prior tobacco abuse, and erectile dysfunction   Patient presented today for lipid clinic. Reports he restarted Crestor as his LDLc was elevated, and he stopped taking Zetia. His neuropathy was improved when he was off of  Crestor. He lost 20 lbs in last few months from dietary modification, he exercise regularly. He does not drink anymore. Reviewed options for lowering LDL cholesterol, including PCSK-9 inhibitors, bempedoic acid and inclisiran.  Discussed mechanisms of action, dosing, side effects and potential decreases in LDL cholesterol.  Also reviewed cost information and potential options for patient assistance.     Diet: trying to improve diet Cut down on sugar and carb,Lost 20 lbs in last few months  Drinks water  Coffee - 1 per day hot chocolate 1 per day motivated to cut down to 1 every other day   Exercise: 5-6 days week, bike 30-45 min plus does weight resistance exercise   Family History:  Mother: died from stroke at age 44,  Mother side cousin: MI and bypass at age of 68   Social History:  Alcohol: none Smoking: quit 9.5 years ago  Recreational drug use: never   Labs: Lipid Panel     Component Value Date/Time   CHOL 215 (H) 11/16/2022 1005   TRIG 128 11/16/2022 1005   HDL 39 (L) 11/16/2022 1005   CHOLHDL 5.5 (H) 11/16/2022 1005   CHOLHDL 3 07/28/2014 0903   VLDL 24.2 07/28/2014 0903   LDLCALC 153 (H) 11/16/2022 1005   LABVLDL 23 11/16/2022 1005    Past Medical History:  Diagnosis Date   Achilles tendon injury    right   Arthritis    back   Asthma    as a child   CAD S/P percutaneous coronary  angioplasty 05/25/2014   ED (erectile dysfunction)    Elevated glucose    Erythrocytosis    [facility] [place] [facility]   GERD (gastroesophageal reflux disease)    Gout    Hyperlipidemia    Hypertension    Morton's neuroma    2014   Neuropathy    Seasonal allergies    uses advair in May   ST elevation myocardial infarction (STEMI) of anterior wall ([facility]) 05/25/14   1 stent   Tobacco abuse, + cigars 05/27/2014    Current Outpatient Medications on File Prior to Visit  Medication Sig Dispense Refill   amLODipine (NORVASC) 5 MG tablet Take 1 tablet (5 mg total) by mouth daily. 90 tablet 3   aspirin 81 MG chewable tablet Chew 1 tablet (81 mg total) by mouth daily.     carvedilol (COREG) 12.5 MG tablet Take 1 tablet (12.5 mg total) by mouth 2 (two) times daily. 180 tablet 2   Coenzyme Q10 (CO Q 10) 100 MG CAPS Take 100 mg by mouth daily.     colchicine 0.6 MG tablet Take 1 tablet (0.6 mg total) by mouth daily as needed. 90 tablet 0   Ketotifen Fumarate (ALAWAY OP) Place 1 drop into both  eyes daily as needed (seasonal allergies).     Lidocaine 3 % CREA Apply 1 application  topically 2 (two) times daily.     nitroGLYCERIN (NITROSTAT) 0.4 MG SL tablet Place 1 tablet (0.4 mg total) under the tongue every 5 (five) minutes as needed for chest pain. 25 tablet 3   rosuvastatin (CRESTOR) 20 MG tablet Take 1 tablet (20 mg total) by mouth at bedtime. 90 tablet 3   sildenafil (REVATIO) 20 MG tablet Take 20 mg by mouth See admin instructions. Take 20 mg to 100 mg by mouth daily as needed for erectile dysfunction.     testosterone cypionate (DEPOTESTOSTERONE CYPIONATE) 200 MG/ML injection Inject 0.75 mLs (150 mg total) into the muscle every 14 (fourteen) days.     valsartan-hydrochlorothiazide (DIOVAN HCT) 320-12.5 MG tablet Take 1 tablet by mouth daily. 90 tablet 3   No current facility-administered medications on file prior to visit.    Allergies  Allergen Reactions   Loratadine Anxiety   Claritin-D  12 Hour [Loratadine-Pseudoephedrine Er] Other (See Comments)    shaky   Egg-Derived Products Hives and Itching   Kiwi Extract Other (See Comments)    Throat irritation, swelling   Penicillins Other (See Comments)    Unknown childhood allergic reaction   Simvastatin Other (See Comments)    Numbness in toes, raised A1C, affected memory, fatigue   Gabapentin Other (See Comments)    sedation    Assessment/Plan:  1. Hyperlipidemia -  Problem  Hyperlipidemia With Target Ldl Less Than 70   Qualifier: History of  By: Hetty Ely MD, Franne Grip Current Medications: Ezetimibe  Intolerances: Crestor- worsening neuropathy  Risk Factors: hx of MI-2015, hypertension, HDL, intolerance to statins, family hx of ASCVD  LDL goal: <70 mg dl, non HDLc <161,WR<604 VWUJ<81    Hyperlipidemia with target LDL less than 70 Assessment:  LDL goal: <70 mg/dl last LDLc  191 mg/dl ( 47/8295) Tolerates Zetia  well without any side effects  Intolerance to statins- worsens neuropathy - tried Crestor 10 and 20 mg   Discussed next potential options (PCSK-9 inhibitors, bempedoic acid and inclisiran); cost, dosing efficacy, side effects  Diet is healthy and exercises regularly   Plan: Continue taking current medications Zetia 10 mg daily  Will apply for PA for PCSK9i; will inform patient upon approval (prefers MyChart message) 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 [place], [place] [address]  Phone: [phone]; Fax: [phone]

## 2022-12-07 NOTE — Telephone Encounter (Signed)
Pharmacy Patient Advocate Encounter   Received notification from [facility] that prior authorization for REPATHA 140MG /ML is required/requested.   PA submitted on 5.22.24 to (ins) [employer] via CoverMyMeds Key or (Medicaid) confirmation # [id number]   Status is pending   .

## 2022-12-07 NOTE — Assessment & Plan Note (Signed)
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

## 2022-12-07 NOTE — Patient Instructions (Signed)
Your Results:             Your most recent labs Goal  Total Cholesterol 215 < 200  Triglycerides 128 < 150  HDL (happy/good cholesterol) 39 > 40  LDL (lousy/bad cholesterol 153 < 70   Medication changes: We will start the process to get PCSK9i (Repatha or Praluent)  covered by your insurance.  Once the prior authorization is complete, we will call you to let you know and confirm pharmacy information.     Praluent is a cholesterol medication that improved your body's ability to get rid of "bad cholesterol" known as LDL. It can lower your LDL up to 60%. It is an injection that is given under the skin every 2 weeks. The most common side effects of Praluent include runny nose, symptoms of the common cold, rarely flu or flu-like symptoms, back/muscle pain in about 3-4% of the patients, and redness, pain, or bruising at the injection site.    Repatha is a cholesterol medication that improved your body's ability to get rid of "bad cholesterol" known as LDL. It can lower your LDL up to 60%! It is an injection that is given under the skin every 2 weeks.  The most common side effects of Repatha include runny nose, symptoms of the common cold, rarely flu or flu-like symptoms, back/muscle pain in about 3-4% of the patients, and redness, pain, or bruising at the injection site.   Lab orders: We want to repeat labs after 2-3 months.  We will send you a lab order to remind you once we get closer to that time.    Carmela Hurt, Pharm.D [facility] of [facility] [address] [place], [place] [address]  Phone: [phone]; Fax: [phone]

## 2022-12-07 NOTE — Telephone Encounter (Signed)
PA requested for PCSK9i.

## 2022-12-08 LAB — LIPOPROTEIN A (LPA): Lipoprotein (a): 17.8 nmol/L (ref <75.0)

## 2022-12-09 MED ORDER — REPATHA SURECLICK 140 MG/ML ~~LOC~~ SOAJ: 1.0000 | SUBCUTANEOUS | 3 refills | Status: DC

## 2022-12-13 ENCOUNTER — Other Ambulatory Visit: Payer: Managed Care, Other (non HMO)

## 2022-12-15 ENCOUNTER — Other Ambulatory Visit: Payer: Self-pay

## 2022-12-15 MED ORDER — VALSARTAN-HYDROCHLOROTHIAZIDE 320-12.5 MG PO TABS: 1.0000 | ORAL_TABLET | Freq: Every day | ORAL | 1 refills | Status: DC

## 2023-01-13 NOTE — Telephone Encounter (Signed)
Pharmacy Patient Advocate Encounter   Prior Authorization for REPATHA has been APPROVED by [employer] .Effectuve until 11.22.24     Key: [id number]       

## 2023-01-13 NOTE — Telephone Encounter (Signed)
Pharmacy Patient Advocate Encounter   Prior Authorization for REPATHA has been APPROVED by [facility] .Effectuve until 11.22.24     Key: [id number]

## 2023-01-23 LAB — COLOGUARD: Cologuard: NEGATIVE

## 2023-01-27 LAB — COLOGUARD: COLOGUARD: NEGATIVE

## 2023-01-27 LAB — EXTERNAL GENERIC LAB PROCEDURE: COLOGUARD: NEGATIVE

## 2023-01-28 ENCOUNTER — Encounter: Payer: Self-pay | Admitting: Family Medicine

## 2023-01-30 ENCOUNTER — Encounter: Payer: Self-pay | Admitting: Family Medicine

## 2023-01-31 ENCOUNTER — Encounter: Payer: Self-pay | Admitting: Family Medicine

## 2023-02-01 ENCOUNTER — Other Ambulatory Visit: Payer: Self-pay | Admitting: Family Medicine

## 2023-02-01 DIAGNOSIS — E291 Testicular hypofunction: Secondary | ICD-10-CM

## 2023-02-10 LAB — HM HEPATITIS C SCREENING LAB: HM Hepatitis Screen: NEGATIVE

## 2023-03-14 ENCOUNTER — Ambulatory Visit: Payer: Managed Care, Other (non HMO) | Admitting: Oncology

## 2023-03-14 ENCOUNTER — Other Ambulatory Visit: Payer: Managed Care, Other (non HMO)

## 2023-04-12 ENCOUNTER — Other Ambulatory Visit: Payer: Self-pay | Admitting: Physician Assistant

## 2023-04-16 ENCOUNTER — Other Ambulatory Visit: Payer: Self-pay | Admitting: Family Medicine

## 2023-04-16 DIAGNOSIS — I10 Essential (primary) hypertension: Secondary | ICD-10-CM

## 2023-04-17 ENCOUNTER — Other Ambulatory Visit (INDEPENDENT_AMBULATORY_CARE_PROVIDER_SITE_OTHER): Payer: Managed Care, Other (non HMO)

## 2023-04-17 DIAGNOSIS — I10 Essential (primary) hypertension: Secondary | ICD-10-CM | POA: Diagnosis not present

## 2023-04-17 NOTE — Addendum Note (Signed)
Addended by: Lovena Neighbours on: 04/17/2023 08:01 AM   Modules accepted: Orders

## 2023-04-18 LAB — BASIC METABOLIC PANEL
BUN/Creatinine Ratio: 10 (ref 10–24)
BUN: 13 mg/dL (ref 8–27)
CO2: 25 mmol/L (ref 20–29)
Calcium: 9.5 mg/dL (ref 8.6–10.2)
Chloride: 100 mmol/L (ref 96–106)
Creatinine, Ser: 1.25 mg/dL (ref 0.76–1.27)
Glucose: 93 mg/dL (ref 70–99)
Potassium: 4.8 mmol/L (ref 3.5–5.2)
Sodium: 138 mmol/L (ref 134–144)
eGFR: 66 mL/min/{1.73_m2} (ref >59)

## 2023-04-18 LAB — LIPID PANEL
Chol/HDL Ratio: 2.6 {ratio} (ref 0.0–5.0)
Cholesterol, Total: 116 mg/dL (ref 100–199)
HDL: 44 mg/dL (ref >39)
LDL Chol Calc (NIH): 52 mg/dL (ref 0–99)
Triglycerides: 107 mg/dL (ref 0–149)
VLDL Cholesterol Cal: 20 mg/dL (ref 5–40)

## 2023-04-24 ENCOUNTER — Encounter: Payer: Self-pay | Admitting: Family Medicine

## 2023-04-24 ENCOUNTER — Ambulatory Visit (INDEPENDENT_AMBULATORY_CARE_PROVIDER_SITE_OTHER): Payer: Managed Care, Other (non HMO) | Admitting: Family Medicine

## 2023-04-24 VITALS — BP 138/82 | HR 69 | Temp 97.6°F | Ht 67.0 in | Wt 205.0 lb

## 2023-04-24 DIAGNOSIS — I10 Essential (primary) hypertension: Secondary | ICD-10-CM

## 2023-04-24 DIAGNOSIS — Z7189 Other specified counseling: Secondary | ICD-10-CM

## 2023-04-24 DIAGNOSIS — E291 Testicular hypofunction: Secondary | ICD-10-CM

## 2023-04-24 DIAGNOSIS — R202 Paresthesia of skin: Secondary | ICD-10-CM

## 2023-04-24 DIAGNOSIS — L989 Disorder of the skin and subcutaneous tissue, unspecified: Secondary | ICD-10-CM | POA: Insufficient documentation

## 2023-04-24 DIAGNOSIS — Z0000 Encounter for general adult medical examination without abnormal findings: Secondary | ICD-10-CM

## 2023-04-24 DIAGNOSIS — E785 Hyperlipidemia, unspecified: Secondary | ICD-10-CM

## 2023-04-24 MED ORDER — COLCHICINE 0.6 MG PO TABS: 0.3000 mg | ORAL_TABLET | Freq: Every day | ORAL | Status: AC | PRN

## 2023-04-24 NOTE — Assessment & Plan Note (Signed)
Continue repatha and zetia, will update cards re: recent labs.

## 2023-04-24 NOTE — Patient Instructions (Signed)
If any skin spots are changing the let me know.  Take care.  Glad to see you.

## 2023-04-24 NOTE — Progress Notes (Signed)
CPE- See plan.  Routine anticipatory guidance given to patient.  See health maintenance.  The possibility exists that previously documented standard health maintenance information may have been brought forward from a previous encounter into this note.  If needed, that same information has been updated to reflect the current situation based on today's encounter.    Tetanus 2023 Flu done 2024  Shingles d/w pt.   PNA not due covid vaccine 2024 Cologuard neg 2024  PSA per urology.   Living will d/w pt.  Wife is designated if he is incapacitated.   Diet and exercise d/w pt.  biking on a peleton/rowing/treadmill.   HIV prev neg 2002.    HCV prev neg.   He went to PT for his shoulder and improved.    He had neuro eval re: neuropathy. No meds currently.  He is managing as is.    Hypertension:    Using medication without problems or lightheadedness: yes Chest pain with exertion:no Edema:no Short of breath:no  No recent gout flare.  Colchicine cautions d/w pt.    Elevated Cholesterol: Using medications without problems:yes Muscle aches: not likely from repatha.   Diet compliance: yes Exercise: yes  Testosterone replacement per [facility].    PMH and SH reviewed  Meds, vitals, and allergies reviewed.   ROS: Per HPI.  Unless specifically indicated otherwise in HPI, the patient denies:  General: fever. Eyes: acute vision changes ENT: sore throat Cardiovascular: chest pain Respiratory: SOB GI: vomiting GU: dysuria Musculoskeletal: acute back pain Derm: acute rash Neuro: acute motor dysfunction Psych: worsening mood Endocrine: polydipsia Heme: bleeding Allergy: hayfever  GEN: nad, alert and oriented HEENT: ncat NECK: supple w/o LA CV: rrr. PULM: ctab, no inc wob ABD: soft, +bs EXT: no edema SKIN: no acute rash  5mm blanching red lesion R forearm that hasn't changed from old photos he had.  He has mult hemangiomas. No ulceration.

## 2023-04-24 NOTE — Assessment & Plan Note (Signed)
Tetanus 2023 Flu done 2024  Shingles d/w pt.   PNA not due covid vaccine 2024 Cologuard neg 2024  PSA per urology.   Living will d/w pt.  Wife is designated if he is incapacitated.   Diet and exercise d/w pt.  biking on a peleton/rowing/treadmill.   HIV prev neg 2002.    HCV prev neg.

## 2023-04-24 NOTE — Assessment & Plan Note (Signed)
Living will d/w pt.  Wife is designated if he is incapacitated.   

## 2023-04-24 NOTE — Assessment & Plan Note (Signed)
Continue amlodipine coreg and valsartan hydrochlorothiazide. Labs d/w pt.

## 2023-04-24 NOTE — Assessment & Plan Note (Signed)
He can update me as needed.

## 2023-04-24 NOTE — Assessment & Plan Note (Signed)
Per urology.  I'll defer.  Patient agrees.

## 2023-04-24 NOTE — Assessment & Plan Note (Signed)
5mm blanching red lesion R forearm that hasn't changed from old photos he had.  He has mult hemangiomas. No ulceration.  He can monitor and update me if changing.

## 2023-06-19 ENCOUNTER — Other Ambulatory Visit: Payer: Self-pay | Admitting: Cardiovascular Disease

## 2023-06-23 ENCOUNTER — Other Ambulatory Visit: Payer: Self-pay

## 2023-06-23 MED ORDER — EZETIMIBE 10 MG PO TABS: 10.0000 mg | ORAL_TABLET | Freq: Every day | ORAL | 2 refills | Status: DC

## 2023-06-27 ENCOUNTER — Ambulatory Visit: Payer: Managed Care, Other (non HMO) | Attending: Internal Medicine | Admitting: Internal Medicine

## 2023-06-27 ENCOUNTER — Encounter: Payer: Self-pay | Admitting: Internal Medicine

## 2023-06-27 VITALS — BP 136/81 | HR 75 | Ht 67.0 in | Wt 221.0 lb

## 2023-06-27 DIAGNOSIS — I440 Atrioventricular block, first degree: Secondary | ICD-10-CM | POA: Insufficient documentation

## 2023-06-27 DIAGNOSIS — I10 Essential (primary) hypertension: Secondary | ICD-10-CM

## 2023-06-27 DIAGNOSIS — Z955 Presence of coronary angioplasty implant and graft: Secondary | ICD-10-CM

## 2023-06-27 DIAGNOSIS — E785 Hyperlipidemia, unspecified: Secondary | ICD-10-CM | POA: Diagnosis not present

## 2023-06-27 DIAGNOSIS — I2510 Atherosclerotic heart disease of native coronary artery without angina pectoris: Secondary | ICD-10-CM | POA: Diagnosis not present

## 2023-06-27 DIAGNOSIS — Z87891 Personal history of nicotine dependence: Secondary | ICD-10-CM | POA: Insufficient documentation

## 2023-06-27 MED ORDER — NITROGLYCERIN 0.4 MG SL SUBL: 0.4000 mg | SUBLINGUAL_TABLET | SUBLINGUAL | 3 refills | Status: AC | PRN

## 2023-06-27 NOTE — Patient Instructions (Signed)
Medication Instructions:  Your physician recommends that you continue on your current medications as directed. Please refer to the Current Medication list given to you today. REFILLED: Nitroglycerin  *If you need a refill on your cardiac medications before your next appointment, please call your pharmacy*   Lab Work: NONE If you have labs (blood work) drawn today and your tests are completely normal, you will receive your results only by: MyChart Message (if you have MyChart) OR A paper copy in the mail If you have any lab test that is abnormal or we need to change your treatment, we will call you to review the results.   Testing/Procedures: NONE   Follow-Up: At [facility], you and your health needs are our priority.  As part of our continuing mission to provide you with exceptional heart care, we have created designated Provider Care Teams.  These Care Teams include your primary Cardiologist (physician) and Advanced Practice Providers (APPs -  Physician Assistants and Nurse Practitioners) who all work together to provide you with the care you need, when you need it.    Your next appointment:   1 year(s)  Provider:   Riley Lam, MD

## 2023-06-27 NOTE — Progress Notes (Signed)
Cardiology Office Note:  .    Date:  06/27/2023  ID:  Stephen [name], DOB 16-Nov-1960, MRN [id number] PCP: Joaquim Nam, MD  [facility] Providers Cardiologist:  Lesleigh Noe, MD (Inactive)     CC: Transition to new cardiologist  History of Present Illness: .    Stephen [name] is a 62 y.o. male with prior MI (seen by Dr. Katrinka Blazing) who presents in 2024 for secondary prevention.  Stephen [name], a 64 year old with a history of coronary artery disease, hyperlipidemia, hypertension, and tobacco abuse, presents for a routine follow-up. He had an anterior STEMI in 2015, treated with drug-eluting stents, and has been doing well since. 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 His blood pressure has been reasonably well controlled, and his cholesterol levels were excellent in the last evaluation in September.  He reports no current cardiac symptoms, such as pain or shortness of breath, even when going upstairs. He has been maintaining an active lifestyle, with a home gym and regular rowing exercises, despite a recent shoulder impingement. He also mentions a history of neuropathy in his feet, which he initially attributed to statin use, but the symptoms persisted even after discontinuation of the statin.  In terms of diet, he has been making changes towards a low-carb, high-protein diet, with an emphasis on vegetables. He expresses a desire to lose weight and potentially reduce his medication load in the future. He has not been using his nitroglycerin prescription, and it appears to be needed only for refill due to expiration.  Overall, he has been managing his conditions well, with good control of his blood pressure and cholesterol levels, and is actively engaged in preventive measures such as diet and exercise.  Relevant histories: .  Social from [place] originally, came here in 2002.  Married.   Has a home gym. ROS: As per HPI.   Studies Reviewed: .   Cardiac Studies & Procedures     STRESS TESTS  EXERCISE TOLERANCE TEST (ETT) 09/25/2018  Narrative  The patient walked for a total of 8 minutes and 43 seconds on a Bruce protocol treadmill test. His peak heart rate was 109 which is 66% predicted maximal heart rate.  At peak exercise there were no ST or T wave changes.  Blood pressure demonstrated a hypertensive response to exercise.  This is interpreted as a nondiagnostic exercise test due to inability to achieve target heart rate. The patient had an exaggerated hypertensive response. There was no evidence of ischemia at submaximal exercise.              Physical Exam:    VS:  BP 136/81   Pulse 75   Ht 5\' 7"  (1.702 m)   Wt 221 lb (100.2 kg)   SpO2 95%   BMI 34.61 kg/m    Wt Readings from Last 3 Encounters:  06/27/23 221 lb (100.2 kg)  04/24/23 205 lb (93 kg)  09/13/22 217 lb 9.6 oz (98.7 kg)    Gen: no distress   Neck: No JVD Ears: Bilateral Homero Fellers Sign Cardiac: No Rubs or Gallops, no murmur, RRR +2 radial pulses Respiratory: Clear to auscultation bilaterally, normal effort, normal  respiratory rate GI: Soft, nontender, non-distended  MS: No  edema;  moves all extremities Integument: Skin feels warm Neuro:  At time of evaluation, alert and oriented to person/place/time/situation  Psych: Normal affect, patient feels well   ASSESSMENT AND PLAN: .  Coronary Artery Disease (CAD) CAD with anterior STEMI in 2015 treated with drug-eluting stents. Currently asymptomatic. Echocardiogram shows normal heart function. Blood pressure and cholesterol levels are well managed with Repatha and Zetia. Statin myopathy precludes statin use. Discussed benefits of aerobic and anaerobic exercise for cardiovascular fitness and potential medication reduction. Emphasized maintaining blood pressure control and LDL cholesterol under 55 mg/dL to reduce future cardiovascular risk. 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 Blood pressure slightly elevated at 136 mmHg, likely due to meeting a new cardiologist. Emphasized regular monitoring and maintaining controlled blood pressure to reduce cardiovascular risk. - Continue valsartan and hydrochlorothiazide - Monitor blood pressure regularly at home  Hyperlipidemia Hyperlipidemia managed with Repatha and Zetia due to statin myopathy. Cholesterol levels well controlled as of September. Discussed potential future reduction of Zetia with improved cardiovascular fitness. - Continue Repatha and Zetia - Consider future reduction of Zetia with improved cardiovascular fitness  Peripheral Neuropathy - Continue current management without statins, I do not suspect this is statin mediated  General Health Maintenance Discussed diet and exercise for cardiovascular prevention. Encouraged Mediterranean diet and high protein, low carb diet. Recommended use of [email or web address] for meal planning. Emphasized benefits of Mediterranean diet in reducing mortality and improving overall health. - Encourage Mediterranean diet - Recommend high protein, low carb diet - he has been smoke free for some time now  Follow-up - Schedule follow-up appointment in one year with cardiologist or Jari Favre PA-C.   Riley Lam, MD FASE [facility] Cardiologist [facility]  [address] [place], [address] [place], [place] [address] [phone]  9:07 AM

## 2023-07-19 ENCOUNTER — Encounter: Payer: Self-pay | Admitting: Internal Medicine

## 2023-07-20 ENCOUNTER — Telehealth: Payer: Self-pay | Admitting: Pharmacy Technician

## 2023-07-20 ENCOUNTER — Other Ambulatory Visit (HOSPITAL_COMMUNITY): Payer: Self-pay

## 2023-07-20 ENCOUNTER — Encounter: Payer: Self-pay | Admitting: Oncology

## 2023-07-20 MED ORDER — REPATHA SURECLICK 140 MG/ML ~~LOC~~ SOAJ: 1.0000 | SUBCUTANEOUS | 3 refills | Status: DC

## 2023-07-20 NOTE — Telephone Encounter (Signed)
 Pharmacy Patient Advocate Encounter  Received notification from [employer] that Prior Authorization for repatha  has been APPROVED from 07/20/23 to 07/18/24. Ran test claim, Copay is $50.00 one month. This test claim was processed through [facility]- copay amounts may vary at other pharmacies due to pharmacy/plan contracts, or as the patient moves through the different stages of their insurance plan.   PA #/Case ID/Reference #: [id number]

## 2023-07-20 NOTE — Telephone Encounter (Signed)
 Pt made aware of the approval.

## 2023-07-20 NOTE — Telephone Encounter (Signed)
 Pharmacy Patient Advocate Encounter   Received notification from Patient Advice Request messages that prior authorization for repatha  is required/requested.   Insurance verification completed.   The patient is insured through [employer] .   Per test claim: PA required; PA submitted to above mentioned insurance via CoverMyMeds Key/confirmation #/EOC [id number] Status is pending

## 2023-12-22 DIAGNOSIS — H524 Presbyopia: Secondary | ICD-10-CM | POA: Diagnosis not present

## 2024-03-21 DIAGNOSIS — L821 Other seborrheic keratosis: Secondary | ICD-10-CM | POA: Diagnosis not present

## 2024-03-21 DIAGNOSIS — L570 Actinic keratosis: Secondary | ICD-10-CM | POA: Diagnosis not present

## 2024-03-21 DIAGNOSIS — D225 Melanocytic nevi of trunk: Secondary | ICD-10-CM | POA: Diagnosis not present

## 2024-03-21 DIAGNOSIS — D485 Neoplasm of uncertain behavior of skin: Secondary | ICD-10-CM | POA: Diagnosis not present

## 2024-03-21 DIAGNOSIS — L820 Inflamed seborrheic keratosis: Secondary | ICD-10-CM | POA: Diagnosis not present

## 2024-03-21 DIAGNOSIS — D1801 Hemangioma of skin and subcutaneous tissue: Secondary | ICD-10-CM | POA: Diagnosis not present

## 2024-03-21 DIAGNOSIS — D2272 Melanocytic nevi of left lower limb, including hip: Secondary | ICD-10-CM | POA: Diagnosis not present

## 2024-03-21 DIAGNOSIS — D2261 Melanocytic nevi of right upper limb, including shoulder: Secondary | ICD-10-CM | POA: Diagnosis not present

## 2024-03-21 DIAGNOSIS — D2262 Melanocytic nevi of left upper limb, including shoulder: Secondary | ICD-10-CM | POA: Diagnosis not present

## 2024-04-15 ENCOUNTER — Other Ambulatory Visit: Payer: Self-pay

## 2024-04-16 MED ORDER — VALSARTAN-HYDROCHLOROTHIAZIDE 320-12.5 MG PO TABS: 1.0000 | ORAL_TABLET | Freq: Every day | ORAL | 0 refills | Status: DC

## 2024-04-26 ENCOUNTER — Encounter: Payer: Self-pay | Admitting: Oncology

## 2024-04-29 ENCOUNTER — Other Ambulatory Visit: Payer: Self-pay

## 2024-04-30 MED ORDER — CARVEDILOL 12.5 MG PO TABS: 12.5000 mg | ORAL_TABLET | Freq: Two times a day (BID) | ORAL | 0 refills | Status: DC

## 2024-04-30 MED ORDER — AMLODIPINE BESYLATE 5 MG PO TABS: 5.0000 mg | ORAL_TABLET | Freq: Every day | ORAL | 0 refills | Status: DC

## 2024-05-03 ENCOUNTER — Encounter: Admitting: Family Medicine

## 2024-06-12 DIAGNOSIS — M545 Low back pain, unspecified: Secondary | ICD-10-CM | POA: Diagnosis not present

## 2024-06-23 ENCOUNTER — Other Ambulatory Visit: Payer: Self-pay | Admitting: Physician Assistant

## 2024-06-25 ENCOUNTER — Encounter: Admitting: Family Medicine

## 2024-07-03 ENCOUNTER — Other Ambulatory Visit (HOSPITAL_COMMUNITY): Payer: Self-pay

## 2024-07-03 ENCOUNTER — Encounter: Payer: Self-pay | Admitting: Oncology

## 2024-07-03 ENCOUNTER — Telehealth: Payer: Self-pay

## 2024-07-03 DIAGNOSIS — E785 Hyperlipidemia, unspecified: Secondary | ICD-10-CM

## 2024-07-03 NOTE — Telephone Encounter (Signed)
 Informed pt that updated Lipid panel was needed for Repatha  approval. Order placed and patient stated he has an appt Friday and will get it drawn then.

## 2024-07-03 NOTE — Telephone Encounter (Signed)
 Awesome, thank you Andrez!

## 2024-07-03 NOTE — Telephone Encounter (Signed)
 Pharmacy Patient Advocate Encounter   Received notification from Fax that prior authorization for REPATHA  is required/requested.   Insurance verification completed.   The patient is insured through [facility].   Per test claim: PA required; PA started via CoverMyMeds. KEY NA . Please see clinical question(s) below that I am not finding the answer to in their chart and advise.   FOR REPATHA  RENEWAL, PLAN REQUIRES LDL C WITHIN THE LAST 6 MONTHS, PLEASE ADVISE

## 2024-07-05 ENCOUNTER — Encounter: Payer: Self-pay | Admitting: Internal Medicine

## 2024-07-05 ENCOUNTER — Ambulatory Visit: Attending: Internal Medicine | Admitting: Internal Medicine

## 2024-07-05 ENCOUNTER — Other Ambulatory Visit: Payer: Self-pay

## 2024-07-05 VITALS — BP 138/78 | HR 70 | Ht 67.0 in | Wt 225.0 lb

## 2024-07-05 DIAGNOSIS — I10 Essential (primary) hypertension: Secondary | ICD-10-CM

## 2024-07-05 DIAGNOSIS — T466X5A Adverse effect of antihyperlipidemic and antiarteriosclerotic drugs, initial encounter: Secondary | ICD-10-CM | POA: Insufficient documentation

## 2024-07-05 DIAGNOSIS — Z87891 Personal history of nicotine dependence: Secondary | ICD-10-CM | POA: Diagnosis not present

## 2024-07-05 DIAGNOSIS — I440 Atrioventricular block, first degree: Secondary | ICD-10-CM | POA: Diagnosis not present

## 2024-07-05 DIAGNOSIS — T466X5D Adverse effect of antihyperlipidemic and antiarteriosclerotic drugs, subsequent encounter: Secondary | ICD-10-CM

## 2024-07-05 DIAGNOSIS — M791 Myalgia, unspecified site: Secondary | ICD-10-CM | POA: Diagnosis not present

## 2024-07-05 DIAGNOSIS — I2510 Atherosclerotic heart disease of native coronary artery without angina pectoris: Secondary | ICD-10-CM

## 2024-07-05 DIAGNOSIS — E785 Hyperlipidemia, unspecified: Secondary | ICD-10-CM | POA: Diagnosis not present

## 2024-07-05 LAB — LIPID PANEL
Chol/HDL Ratio: 3.3 ratio (ref 0.0–5.0)
Cholesterol, Total: 129 mg/dL (ref 100–199)
HDL: 39 mg/dL — ABNORMAL LOW
LDL Chol Calc (NIH): 65 mg/dL (ref 0–99)
Triglycerides: 144 mg/dL (ref 0–149)
VLDL Cholesterol Cal: 25 mg/dL (ref 5–40)

## 2024-07-05 MED ORDER — VALSARTAN 320 MG PO TABS: 320.0000 mg | ORAL_TABLET | Freq: Every day | ORAL | 3 refills | Status: AC

## 2024-07-05 NOTE — Progress Notes (Signed)
 " Cardiology Office Note:  .    Date:  07/05/2024  ID:  Stephen [name], DOB June 22, 1961, MRN [id number] PCP: Stephen Arlyss RAMAN, MD  [facility] Providers Cardiologist:  Victory LELON Claudene DOUGLAS, MD (Inactive)     CC: Secondary Prevention  History of Present Illness: .    Stephen [name] is a 63 y.o. male with prior MI (seen by Dr. Claudene) who presents in 2024 for secondary prevention. 2024: Established care with me.  Stephen [name] is a 44 yo M with coronary artery disease who presents for secondary prevention.  He has a history of an anterior STEMI in 2015, treated with drug-eluting stents, and has been asymptomatic since his last visit. He follows a Mediterranean diet, high in protein and low in carbohydrates, and has been smoke-free for a significant period. He exercises regularly using his home gym but struggles with outdoor activities due to heat intolerance, which may be related to his diuretic use.  He has a history of statin myopathy and is currently on Repatha  and Zetia  for hyperlipidemia, with good tolerance to these medications. His LDL levels have been well-controlled on his current regimen.  He monitors his blood pressure daily, noting that it tends to be higher during medical visits, which he attributes to 'white coat hypertension'. His blood pressure is generally well-controlled at home. He is currently on a combination pill of Diovan  HCT, which includes a diuretic, and he wants to discontinue the diuretic due to concerns about dehydration and heat intolerance.  He reports some joint discomfort in his shoulders, knees, and back, and is unsure if this is related to his medications. He also mentions experiencing neuropathy, which has improved with current treatment.  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 He enjoys exercising daily but finds yard work challenging due to heat sensitivity. He has  noticed some swelling in his legs, which he associates with prolonged sitting in the past, but not currently. Relevant histories: .  Social from [place] originally, came here in 2002.  Married.  Has a home gym. 2025: Comes with wife ROS: As per HPI.   Studies Reviewed: .   Cardiac Studies & Procedures   ______________________________________________________________________________________________   STRESS TESTS  EXERCISE TOLERANCE TEST (ETT) 09/25/2018  Interpretation Summary  The patient walked for a total of 8 minutes and 43 seconds on a Bruce protocol treadmill test. His peak heart rate was 109 which is 66% predicted maximal heart rate.  At peak exercise there were no ST or T wave changes.  Blood pressure demonstrated a hypertensive response to exercise.  This is interpreted as a nondiagnostic exercise test due to inability to achieve target heart rate. The patient had an exaggerated hypertensive response. There was no evidence of ischemia at submaximal exercise.            ______________________________________________________________________________________________      Physical Exam:    VS:  BP 138/78   Pulse 70   Ht 5' 7 (1.702 m)   Wt 225 lb (102.1 kg)   SpO2 96%   BMI 35.24 kg/m    Wt Readings from Last 3 Encounters:  07/05/24 225 lb (102.1 kg)  06/27/23 221 lb (100.2 kg)  04/24/23 205 lb (93 kg)    Gen: No distress   Neck: No JVD Ears: Bilateral Dempsey Sign Cardiac: No Rubs or Gallops, no murmur, RRR +2 radial pulses Respiratory: Clear to auscultation bilaterally, normal effort, normal  respiratory rate GI: Soft, nontender, non-distended  MS: R leg swelling (+1);  moves all extremities Integument: Skin feels warm Neuro:  At time of evaluation, alert and oriented to person/place/time/situation  Psych: Normal affect, patient feels well   ASSESSMENT AND PLAN: .    Coronary artery disease, post-myocardial infarction, with prior stent  placement FAV Coronary artery disease with prior anterior STEMI in 2015, treated with drug-eluting stents. Currently asymptomatic with well-controlled blood pressure. Lifestyle modifications, including exercise and a Mediterranean diet, have been beneficial. - Continue current lifestyle modifications, including exercise and Mediterranean diet. - continue current medication  Hyperlipidemia with statin intolerance Hyperlipidemia with statin intolerance, currently managed with Repatha  and Zetia . Labs show good control of cholesterol levels. Decision made to discontinue Zetia  to reduce medication burden while maintaining lipid control with Repatha . - Discontinued Zetia  (ezetimibe ). - Continue Repatha . - Will review lab results for lipid control.  Essential hypertension White coat hypertension, with higher readings in the clinic compared to home. Current medication includes Diovan  HCT, which contains a diuretic. Concerns about diuretic contributing to heat intolerance and leg swelling. Plan to switch to valsartan  monotherapy to assess impact on blood pressure and symptoms. - Switched from Diovan  HCT to valsartan  320 mg. - Monitor blood pressure at home. - Perform ambulatory blood pressure monitoring. - Will schedule follow-up in one month to assess blood pressure control and leg swelling. - Will order BMP in one month to check electrolytes. - Will recalibrate blood pressure cuff at follow-up.  F/u with me or my team in one year after BP eval if no issues.  Longitudinal care: The evaluation and management services provided today reflect the complexity inherent in caring for this patient, including the ongoing longitudinal relationship and management of multiple chronic conditions and/or the need for care coordination. The visit required a comprehensive assessment and management plan tailored to the patient's unique needs Time was spent addressing not only the acute concerns but also the broader  context of the patient's health, including preventive care, chronic disease management, and care coordination as appropriate.  Complex longitudinal is necessary for conditions including: secondary prevention in the setting of medication intolerance   Stanly Leavens, MD FASE [facility] Cardiologist [facility]  [address] [place], [address] [place], [place] [address] [phone]  10:30 AM  "

## 2024-07-05 NOTE — Patient Instructions (Addendum)
 Medication Instructions:  Your physician has recommended you make the following change in your medication:  STOP: Diovan  START: Valsartan  320 mg by mouth once daily  *If you need a refill on your cardiac medications before your next appointment, please call your pharmacy*  Lab Work: IN 1 MONTH: BMP  If you have labs (blood work) drawn today and your tests are completely normal, you will receive your results only by: MyChart Message (if you have MyChart) OR A paper copy in the mail If you have any lab test that is abnormal or we need to change your treatment, we will call you to review the results.  Testing/Procedures: Your physician has referred you to see the Pharmacy Clinic.  Please bring your BP cuff to visit.   Follow-Up: At [facility], you and your health needs are our priority.  As part of our continuing mission to provide you with exceptional heart care, our providers are all part of one team.  This team includes your primary Cardiologist (physician) and Advanced Practice Providers or APPs (Physician Assistants and Nurse Practitioners) who all work together to provide you with the care you need, when you need it.  Your next appointment:   1 year(s)  Provider:   Stanly Leavens, MD or One of our Advanced Practice Providers (APPs): Morse Clause, PA-C  Lamarr Satterfield, NP Miriam Shams, NP  Olivia Pavy, PA-C Josefa Beauvais, NP  Leontine Salen, PA-C Orren Fabry, PA-C  Hao Meng, PA-C Ernest Dick, NP  Damien Braver, NP Jon Hails, PA-C  Taylor Parcells, PA-C    Dayna Dunn, PA-C  Scott Weaver, PA-C Lum Louis, NP Katlyn West, NP Callie Goodrich, PA-C  Xika Zhao, NP Sheng Haley, PA-C    Kathleen Johnson, PA-C

## 2024-07-09 ENCOUNTER — Ambulatory Visit: Payer: Self-pay | Admitting: Physician Assistant

## 2024-07-18 ENCOUNTER — Encounter: Payer: Self-pay | Admitting: Oncology

## 2024-07-24 ENCOUNTER — Encounter: Payer: Self-pay | Admitting: Internal Medicine

## 2024-07-25 ENCOUNTER — Other Ambulatory Visit: Payer: Self-pay

## 2024-07-25 MED ORDER — CARVEDILOL 12.5 MG PO TABS: 12.5000 mg | ORAL_TABLET | Freq: Two times a day (BID) | ORAL | 3 refills | Status: AC

## 2024-07-25 MED ORDER — AMLODIPINE BESYLATE 5 MG PO TABS: 5.0000 mg | ORAL_TABLET | Freq: Every day | ORAL | 3 refills | Status: AC

## 2024-07-25 MED ORDER — REPATHA SURECLICK 140 MG/ML ~~LOC~~ SOAJ: 1.0000 | SUBCUTANEOUS | 3 refills | Status: AC

## 2024-07-25 NOTE — Telephone Encounter (Signed)
RXs sent in 

## 2024-07-29 ENCOUNTER — Encounter: Payer: Self-pay | Admitting: Internal Medicine

## 2024-07-30 ENCOUNTER — Telehealth: Payer: Self-pay | Admitting: Pharmacy Technician

## 2024-07-30 NOTE — Telephone Encounter (Signed)
" ° ° °  Pharmacy Patient Advocate Encounter   Received notification from [employer] KEY that prior authorization for REPATHA  is required/requested.   Insurance verification completed.   The patient is insured through [facility].   Per test claim: PA required; PA submitted to above mentioned insurance via Latent Key/confirmation #/EOC [id number] Status is pending  "

## 2024-07-31 ENCOUNTER — Encounter: Payer: Self-pay | Admitting: Family Medicine

## 2024-07-31 ENCOUNTER — Encounter: Payer: Self-pay | Admitting: Oncology

## 2024-07-31 DIAGNOSIS — E291 Testicular hypofunction: Secondary | ICD-10-CM

## 2024-07-31 NOTE — Telephone Encounter (Signed)
 Ok to send pended referral

## 2024-07-31 NOTE — Telephone Encounter (Signed)
 Pharmacy Patient Advocate Encounter  Received notification from [facility] that Prior Authorization for repatha  has been APPROVED from 07/31/24 to 07/30/27   PA #/Case ID/Reference #: [id number]

## 2024-08-06 LAB — BASIC METABOLIC PANEL WITH GFR
BUN/Creatinine Ratio: 11 (ref 10–24)
BUN: 13 mg/dL (ref 8–27)
CO2: 26 mmol/L (ref 20–29)
Calcium: 9.3 mg/dL (ref 8.6–10.2)
Chloride: 101 mmol/L (ref 96–106)
Creatinine, Ser: 1.17 mg/dL (ref 0.76–1.27)
Glucose: 95 mg/dL (ref 70–99)
Potassium: 4.9 mmol/L (ref 3.5–5.2)
Sodium: 142 mmol/L (ref 134–144)
eGFR: 70 mL/min/1.73

## 2024-08-07 ENCOUNTER — Ambulatory Visit: Payer: Self-pay

## 2024-08-13 ENCOUNTER — Encounter: Payer: Self-pay | Admitting: Oncology

## 2024-08-18 ENCOUNTER — Encounter: Payer: Self-pay | Admitting: Oncology

## 2024-08-22 ENCOUNTER — Encounter: Payer: Self-pay | Admitting: Pharmacist

## 2024-08-22 ENCOUNTER — Ambulatory Visit: Admitting: Pharmacist

## 2024-08-22 VITALS — BP 146/72 | HR 72

## 2024-08-22 DIAGNOSIS — I10 Essential (primary) hypertension: Secondary | ICD-10-CM

## 2024-08-22 NOTE — Progress Notes (Signed)
 Patient ID: Stephen [name]                 DOB: [birth year]                      MRN: [id number]      HPI: Stephen [name] is a 64 y.o. male referred by Dr. Santo to HTN clinic. PMH is significant for history of an anterior STEMI in 2015, treated with drug-eluting stent, HLD,   Patient was in tot see Dr.Chandrasekhar on 07/05/2024 his bP was elevated in the office but reported his home BP stays at goal- has white coat hypertension. he wanted to d/c diuretics hydrochlorothiazide  contributing to heat intolerance and leg swelling.  So hydrochlorothiazide  was d/c'd and valsartan  was continued as monotherapy   Patient presented today with his wife. Reports his BP at home ~ 130-135/70 heart rate 65 range. He brought in home BP cuff - validated it reads within 10 points. He is anxious about his BP during this visit. He follows low salt diet and exercise 30-45 min most days of the week using home gym equipment. He admits he needs to improve his diet so he can loose weight specially around waistline   he takes his BP meds regularly. Uses a pill box to track adherence. Reports he feels much better off of diuretics- more energy and he did not noted any significant BP rise after stopping hydrochlorothiazide .   Current HTN meds: amlodipine  5 mg daily(pm), carvedilol  12.5 mg twice daily, valsartan  320 mg every morning  Previously tried: hydrochlorothiazide - tired and did not noted much drop in BP  BP goal: <130/80   Family History:  Relation Problem Comments  Mother - Mom (Deceased at age 65) Stroke     Father - Dad (Deceased) AAA (abdominal aortic aneurysm)   Dementia   Hypertension   Stroke (Age: 61) Multiple    Sister - Sis Metallurgist) Hyperlipidemia   Hypertension     Brother (Deceased) Alcohol abuse organ failure      Social History:  Alcohol: none  Smoking: none   Diet: watches salt intake. Does not eat out frequently. Need to cut back on chocolates and bread will work on that      Exercise: home gym -spend 30-45 min everyday doing some cardio and some resistance exercise    Home BP readings:  130-135/70 heart rate 65    Wt Readings from Last 3 Encounters:  07/05/24 225 lb (102.1 kg)  06/27/23 221 lb (100.2 kg)  04/24/23 205 lb (93 kg)   BP Readings from Last 3 Encounters:  07/05/24 138/78  06/27/23 136/81  04/24/23 138/82   Pulse Readings from Last 3 Encounters:  07/05/24 70  06/27/23 75  04/24/23 69    Renal function: CrCl cannot be calculated (Unknown ideal weight.).  Past Medical History:  Diagnosis Date   Achilles tendon injury    right   Allergy    Arthritis    back   Asthma    as a child   CAD S/P percutaneous coronary angioplasty 05/25/2014   ED (erectile dysfunction)    Elevated glucose    Erythrocytosis    SEES [facility] [place] [facility]   GERD (gastroesophageal reflux disease)    Gout    Hyperlipidemia    Hypertension    Morton's neuroma    2014   Neuropathy    Seasonal allergies    uses advair in May   ST elevation myocardial infarction (STEMI) of  anterior wall (HCC) 05/25/2014   1 stent   Tobacco abuse, + cigars 05/27/2014    Medications Ordered Prior to Encounter[1]  Allergies[2]  There were no vitals taken for this visit.   Assessment/Plan:  1. Hypertension -  No problems updated. No problem-specific Assessment & Plan notes found for this encounter.      Thank you  Robbi Blanch, Pharm.D [place] [facility]. [facility] [address], [place], [place] [address] Phone: [phone]; Fax: [phone]      [1]  Current Outpatient Medications on File Prior to Visit  Medication Sig Dispense Refill   amLODipine  (NORVASC ) 5 MG tablet Take 1 tablet (5 mg total) by mouth daily. 90 tablet 3   aspirin  81 MG chewable tablet Chew 1 tablet (81 mg total) by mouth daily.     carvedilol  (COREG ) 12.5 MG tablet Take 1 tablet (12.5 mg total) by mouth 2 (two) times daily. 180  tablet 3   Coenzyme Q10 (CO Q 10) 100 MG CAPS Take 100 mg by mouth daily.     colchicine  0.6 MG tablet Take 0.5 tablets (0.3 mg total) by mouth daily as needed.     Evolocumab  (REPATHA  SURECLICK) 140 MG/ML SOAJ Inject 140 mg into the skin every 14 (fourteen) days. 6 mL 3   Ketotifen Fumarate (ALAWAY OP) Place 1 drop into both eyes daily as needed (seasonal allergies).     nitroGLYCERIN  (NITROSTAT ) 0.4 MG SL tablet Place 1 tablet (0.4 mg total) under the tongue every 5 (five) minutes as needed for chest pain. 25 tablet 3   sildenafil  (REVATIO ) 20 MG tablet Take 20 mg by mouth See admin instructions. Take 20 mg to 100 mg by mouth daily as needed for erectile dysfunction.     testosterone  cypionate (DEPOTESTOSTERONE CYPIONATE) 200 MG/ML injection Inject 0.75 mLs (150 mg total) into the muscle every 14 (fourteen) days.     valsartan  (DIOVAN ) 320 MG tablet Take 1 tablet (320 mg total) by mouth daily. 90 tablet 3   No current facility-administered medications on file prior to visit.  [2]  Allergies Allergen Reactions   Loratadine Anxiety   Claritin-D 12 Hour [Loratadine-Pseudoephedrine Er] Other (See Comments)    shaky   Duloxetine     Intolerant.     Egg Protein-Containing Drug Products Hives and Itching   Kiwi Extract Other (See Comments)    Throat irritation, swelling   Penicillins Other (See Comments)    Unknown childhood allergic reaction   Simvastatin  Other (See Comments)    Numbness in toes, raised A1C, affected memory, fatigue   Gabapentin Other (See Comments)    sedation

## 2024-08-22 NOTE — Assessment & Plan Note (Signed)
 Assessment: BP is uncontrolled in office BP 146/72 mmHg due to white coat Tolerates current BP meds well without any side effects Correct steps for home BP measurement reviewed with patient   Denies SOB, palpitation, chest pain, headaches,or swelling Did not noted much rise in home BP post hydrochlorothiazide  discontinuation - feels great no tiredness off of diuretics   Follows low salt diet however admits there is room for improvement- need to cut back on bread and chocolates. Exercise 30-45 min most days of the week    Plan:  No change to current BP meds BP machine validated today,  Continue taking amlodipine  5 mg 7 pm, valsartan  320 mg 6 am and carvedilol  12.5 mg twice daily  Patient to keep record of BP readings with heart rate and report to us  at the next visit Patient to see PharmD in 4 weeks for follow up  Follow up lab(s) :none

## 2024-09-25 ENCOUNTER — Ambulatory Visit: Admitting: Pharmacist
